# Patient Record
Sex: Female | Born: 1940
Health system: Southern US, Community
[De-identification: ages and names within clinical notes are randomized; demographics above are authoritative.]

## PROBLEM LIST (undated history)

## (undated) DIAGNOSIS — I10 Essential (primary) hypertension: Secondary | ICD-10-CM

## (undated) DIAGNOSIS — G4730 Sleep apnea, unspecified: Secondary | ICD-10-CM

## (undated) DIAGNOSIS — T7840XA Allergy, unspecified, initial encounter: Secondary | ICD-10-CM

## (undated) DIAGNOSIS — K219 Gastro-esophageal reflux disease without esophagitis: Secondary | ICD-10-CM

## (undated) DIAGNOSIS — F419 Anxiety disorder, unspecified: Secondary | ICD-10-CM

## (undated) DIAGNOSIS — E119 Type 2 diabetes mellitus without complications: Secondary | ICD-10-CM

## (undated) DIAGNOSIS — F32A Depression, unspecified: Secondary | ICD-10-CM

## (undated) DIAGNOSIS — E785 Hyperlipidemia, unspecified: Secondary | ICD-10-CM

## (undated) DIAGNOSIS — F329 Major depressive disorder, single episode, unspecified: Secondary | ICD-10-CM

## (undated) HISTORY — PX: CARPAL TUNNEL RELEASE: SHX101

## (undated) HISTORY — PX: TOTAL KNEE ARTHROPLASTY: SHX125

## (undated) HISTORY — DX: Essential (primary) hypertension: I10

## (undated) HISTORY — DX: Anxiety disorder, unspecified: F41.9

## (undated) HISTORY — PX: LUMBAR LAMINECTOMY: SHX95

## (undated) HISTORY — PX: TOTAL SHOULDER REPLACEMENT: SUR1217

## (undated) HISTORY — DX: Allergy, unspecified, initial encounter: T78.40XA

## (undated) HISTORY — DX: Type 2 diabetes mellitus without complications: E11.9

## (undated) HISTORY — DX: Depression, unspecified: F32.A

## (undated) HISTORY — DX: Gastro-esophageal reflux disease without esophagitis: K21.9

## (undated) HISTORY — PX: PARTIAL HYSTERECTOMY: SHX80

## (undated) HISTORY — DX: Sleep apnea, unspecified: G47.30

## (undated) HISTORY — DX: Hyperlipidemia, unspecified: E78.5

## (undated) HISTORY — PX: BUNIONECTOMY: SHX129

---
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
## 1991-02-19 DIAGNOSIS — Z853 Personal history of malignant neoplasm of breast: Secondary | ICD-10-CM | POA: Insufficient documentation

## 1991-02-19 HISTORY — PX: MASTECTOMY MODIFIED RADICAL: SUR848

## 2008-02-19 DIAGNOSIS — Z86010 Personal history of colonic polyps: Secondary | ICD-10-CM | POA: Insufficient documentation

## 2010-03-22 ENCOUNTER — Ambulatory Visit: Payer: Self-pay | Admitting: Family Medicine

## 2012-02-28 ENCOUNTER — Ambulatory Visit: Payer: Self-pay | Admitting: Internal Medicine

## 2012-06-22 ENCOUNTER — Ambulatory Visit: Payer: Self-pay | Admitting: Internal Medicine

## 2013-06-18 LAB — HM COLONOSCOPY

## 2013-06-23 ENCOUNTER — Ambulatory Visit: Payer: Self-pay | Admitting: Internal Medicine

## 2014-06-27 ENCOUNTER — Ambulatory Visit
Admission: RE | Admit: 2014-06-27 | Discharge: 2014-06-27 | Disposition: A | Discharge status: Home / Self Care | Payer: BLUE CROSS/BLUE SHIELD | Source: Ambulatory Visit | Attending: Internal Medicine | Admitting: Internal Medicine

## 2014-06-27 ENCOUNTER — Other Ambulatory Visit: Payer: Self-pay | Admitting: Internal Medicine

## 2014-06-27 DIAGNOSIS — Z853 Personal history of malignant neoplasm of breast: Secondary | ICD-10-CM

## 2014-06-27 LAB — HEMOGLOBIN A1C: Hgb A1c MFr Bld: 6.9 % — AB (ref 4.0–6.0)

## 2014-06-27 LAB — LIPID PANEL
Cholesterol: 219 mg/dL — AB (ref 0–200)
HDL: 36 mg/dL (ref 35–70)
LDL Cholesterol: 110 mg/dL
Triglycerides: 219 mg/dL — AB (ref 40–160)

## 2014-06-27 LAB — BASIC METABOLIC PANEL
BUN: 16 mg/dL (ref 4–21)
Creatinine: 0.8 mg/dL (ref <=1.1)

## 2014-06-27 LAB — CBC AND DIFFERENTIAL: Hemoglobin: 13.9 g/dL (ref 12.0–16.0)

## 2014-06-27 LAB — TSH: TSH: 3.1 u[IU]/mL (ref <=5.90)

## 2014-08-29 ENCOUNTER — Other Ambulatory Visit: Payer: Self-pay | Admitting: Internal Medicine

## 2014-08-29 ENCOUNTER — Encounter: Payer: Self-pay | Admitting: Internal Medicine

## 2014-08-29 DIAGNOSIS — E1169 Type 2 diabetes mellitus with other specified complication: Secondary | ICD-10-CM | POA: Insufficient documentation

## 2014-08-29 DIAGNOSIS — B009 Herpesviral infection, unspecified: Secondary | ICD-10-CM | POA: Insufficient documentation

## 2014-08-29 DIAGNOSIS — K219 Gastro-esophageal reflux disease without esophagitis: Secondary | ICD-10-CM | POA: Insufficient documentation

## 2014-08-29 DIAGNOSIS — E785 Hyperlipidemia, unspecified: Secondary | ICD-10-CM

## 2014-08-29 DIAGNOSIS — G4733 Obstructive sleep apnea (adult) (pediatric): Secondary | ICD-10-CM | POA: Insufficient documentation

## 2014-08-29 DIAGNOSIS — G44209 Tension-type headache, unspecified, not intractable: Secondary | ICD-10-CM | POA: Insufficient documentation

## 2014-08-29 DIAGNOSIS — I10 Essential (primary) hypertension: Secondary | ICD-10-CM | POA: Insufficient documentation

## 2014-08-29 DIAGNOSIS — F3341 Major depressive disorder, recurrent, in partial remission: Secondary | ICD-10-CM | POA: Insufficient documentation

## 2014-08-29 DIAGNOSIS — M5136 Other intervertebral disc degeneration, lumbar region: Secondary | ICD-10-CM | POA: Insufficient documentation

## 2014-08-29 DIAGNOSIS — E119 Type 2 diabetes mellitus without complications: Secondary | ICD-10-CM

## 2014-08-29 DIAGNOSIS — R1013 Epigastric pain: Secondary | ICD-10-CM | POA: Insufficient documentation

## 2014-08-29 DIAGNOSIS — B356 Tinea cruris: Secondary | ICD-10-CM | POA: Insufficient documentation

## 2014-09-30 ENCOUNTER — Ambulatory Visit: Payer: Self-pay | Admitting: Internal Medicine

## 2014-10-06 DIAGNOSIS — R0681 Apnea, not elsewhere classified: Secondary | ICD-10-CM | POA: Insufficient documentation

## 2014-11-01 ENCOUNTER — Ambulatory Visit (INDEPENDENT_AMBULATORY_CARE_PROVIDER_SITE_OTHER): Payer: BLUE CROSS/BLUE SHIELD | Admitting: Internal Medicine

## 2014-11-01 ENCOUNTER — Encounter: Payer: Self-pay | Admitting: Internal Medicine

## 2014-11-01 ENCOUNTER — Other Ambulatory Visit: Payer: Self-pay

## 2014-11-01 VITALS — BP 128/70 | HR 64 | Ht 63.0 in | Wt 226.6 lb

## 2014-11-01 DIAGNOSIS — I10 Essential (primary) hypertension: Secondary | ICD-10-CM | POA: Diagnosis not present

## 2014-11-01 DIAGNOSIS — E785 Hyperlipidemia, unspecified: Secondary | ICD-10-CM | POA: Diagnosis not present

## 2014-11-01 DIAGNOSIS — G4733 Obstructive sleep apnea (adult) (pediatric): Secondary | ICD-10-CM | POA: Diagnosis not present

## 2014-11-01 DIAGNOSIS — E119 Type 2 diabetes mellitus without complications: Secondary | ICD-10-CM | POA: Diagnosis not present

## 2014-11-01 NOTE — Progress Notes (Signed)
Date:  11/01/2014   Name:  Kimberly Walls   DOB:  1940-06-07   MRN:  [id number]   Chief Complaint: Diabetes and Hypertension Diabetes She presents for her follow-up diabetic visit. She has type 2 diabetes mellitus. The initial diagnosis of diabetes was made 4 months ago. Disease course: Patient does not want to check her blood sugars she is afraid of sticking her finger. She's been working on diet And Trying to Exercise. She Is Found Some Nutritionist under Him and We'll Set up an Appointment to [employer] and Get Some Education Underway. There are no hypoglycemic associated symptoms. Pertinent negatives for hypoglycemia include no tremors. Pertinent negatives for diabetes include no chest pain, no fatigue and no weakness. Current diabetic treatment includes diet. Her weight is decreasing steadily. She is following a generally healthy diet. She participates in exercise three times a week. An ACE inhibitor/angiotensin II receptor blocker is being taken.  Hypertension This is a chronic problem. The current episode started more than 1 year ago. The problem is unchanged. The problem is controlled. Pertinent negatives include no chest pain, palpitations or shortness of breath. There are no associated agents to hypertension.     Review of Systems:  Review of Systems  Constitutional: Negative for chills and fatigue.  Eyes: Negative for visual disturbance.  Respiratory: Negative for choking and shortness of breath.   Cardiovascular: Negative for chest pain and palpitations.  Gastrointestinal: Negative for abdominal pain.  Neurological: Negative for tremors and weakness.    Patient Active Problem List   Diagnosis Date Noted  . DDD (degenerative disc disease), lumbar 08/29/2014  . Dyslipidemia 08/29/2014  . Essential (primary) hypertension 08/29/2014  . Acid reflux 08/29/2014  . Herpes 08/29/2014  . Obstructive sleep apnea of adult 08/29/2014  . Depression, major, recurrent, in partial remission  08/29/2014  . Headache, tension-type 08/29/2014  . Dermatophytosis of groin 08/29/2014  . Diabetes 08/29/2014  . History of colon polyps 02/19/2008  . Personal history of malignant neoplasm of breast 02/19/1991    Prior to Admission medications   Medication Sig Start Date End Date Taking? Authorizing Provider  acyclovir (ZOVIRAX) 400 MG tablet Take 1 tablet by mouth 3 (three) times daily. 06/22/12  Yes Historical Provider, MD  amLODipine (NORVASC) 2.5 MG tablet Take 1 tablet by mouth daily. 12/20/13  Yes Historical Provider, MD  aspirin 81 MG tablet Take 1 tablet by mouth daily.   Yes Historical Provider, MD  Calcium Carb-Cholecalciferol (CALCIUM + D3) 600-200 MG-UNIT TABS Take 1 tablet by mouth daily.   Yes Historical Provider, MD  Coenzyme Q10 (COQ10) 200 MG CAPS Take 1 capsule by mouth daily.   Yes Historical Provider, MD  cyanocobalamin 1000 MCG tablet Take 1 tablet by mouth daily.   Yes Historical Provider, MD  desloratadine (CLARINEX) 5 MG tablet Take 1 tablet by mouth daily. 06/17/14  Yes Historical Provider, MD  escitalopram (LEXAPRO) 10 MG tablet Take 1 tablet by mouth daily. 06/17/14  Yes Historical Provider, MD  hydrochlorothiazide (HYDRODIURIL) 25 MG tablet Take 1 tablet by mouth daily. 01/18/14  Yes Historical Provider, MD  lisinopril (PRINIVIL,ZESTRIL) 20 MG tablet Take 1 tablet by mouth daily. 05/18/14  Yes Historical Provider, MD  nystatin cream (MYCOSTATIN)  10/11/14  Yes Historical Provider, MD  Omeprazole 20 MG TBEC Take 1 tablet by mouth daily. 06/27/14  Yes Historical Provider, MD  oxaprozin (DAYPRO) 600 MG tablet Take 1 tablet by mouth 2 (two) times daily. 06/27/14  Yes Historical Provider, MD  triamcinolone cream (  KENALOG) 0.1 %  10/11/14  Yes Historical Provider, MD  azelastine (OPTIVAR) 0.05 % ophthalmic solution Apply 1 drop to eye daily. 06/22/12   Historical Provider, MD  Biotin (BIOTIN 5000) 5 MG CAPS Take 1 capsule by mouth daily.    Historical Provider, MD  mupirocin  ointment (BACTROBAN) 2 %  10/11/14   Historical Provider, MD    Allergies  Allergen Reactions  . Prochlorperazine   . Tetanus Toxoids Rash    Past Surgical History  Procedure Laterality Date  . Total knee arthroplasty Bilateral   . Mastectomy modified radical Bilateral 1993  . Partial hysterectomy    . Carpal tunnel release Left   . Total shoulder replacement Bilateral   . Lumbar laminectomy    . Bunionectomy Bilateral     Social History  Substance Use Topics  . Smoking status: Never Smoker   . Smokeless tobacco: None  . Alcohol Use: 1.2 oz/week    2 Standard drinks or equivalent per week     Medication list has been reviewed and updated.  Physical Examination:  Physical Exam  Constitutional: She is oriented to person, place, and time. She appears well-developed and well-nourished. No distress.  HENT:  Head: Normocephalic and atraumatic.  Eyes: Conjunctivae are normal. Right eye exhibits no discharge. Left eye exhibits no discharge. No scleral icterus.  Neck: Normal range of motion. No thyromegaly present.  Cardiovascular: Normal rate, regular rhythm and normal heart sounds.   Pulmonary/Chest: Effort normal and breath sounds normal. No respiratory distress. She has no wheezes.  Musculoskeletal: Normal range of motion. She exhibits no edema.  Neurological: She is alert and oriented to person, place, and time.  Skin: Skin is warm and dry. No rash noted.  Psychiatric: She has a normal mood and affect. Her behavior is normal. Thought content normal.    BP 128/70 mmHg  Pulse 64  Ht 5\' 3"  (1.6 m)  Wt 226 lb 9.6 oz (102.785 kg)  BMI 40.15 kg/m2  Assessment and Plan: 1. Essential (primary) hypertension Controlled continue current medication  2. Type 2 diabetes mellitus without complication Appears to be increasing; will check A1c and advise on medication as well as interval follow-up Patient will investigate dietitians in [place] and call for a referral if needed Did  not prescribe a glucometer as patient does not feel she can perform fingerstick blood sugars at home. - Hemoglobin A1c  3. Dyslipidemia Stable on medication Form for work is completed  4. Obstructive sleep apnea of adult Patient continues without using her CPAP - she strongly encouraged to try using it again.   Halina Maidens, MD [facility]  11/01/2014

## 2014-11-02 LAB — HEMOGLOBIN A1C
Est. average glucose Bld gHb Est-mCnc: 160 mg/dL
Hgb A1c MFr Bld: 7.2 % — ABNORMAL HIGH (ref 4.8–5.6)

## 2014-12-05 ENCOUNTER — Ambulatory Visit (INDEPENDENT_AMBULATORY_CARE_PROVIDER_SITE_OTHER): Payer: BLUE CROSS/BLUE SHIELD | Admitting: Internal Medicine

## 2014-12-05 ENCOUNTER — Encounter: Payer: Self-pay | Admitting: Internal Medicine

## 2014-12-05 VITALS — BP 162/78 | HR 76 | Ht 63.0 in | Wt 225.0 lb

## 2014-12-05 DIAGNOSIS — E119 Type 2 diabetes mellitus without complications: Secondary | ICD-10-CM

## 2014-12-05 DIAGNOSIS — I10 Essential (primary) hypertension: Secondary | ICD-10-CM

## 2014-12-05 DIAGNOSIS — F3341 Major depressive disorder, recurrent, in partial remission: Secondary | ICD-10-CM | POA: Diagnosis not present

## 2014-12-05 DIAGNOSIS — M1990 Unspecified osteoarthritis, unspecified site: Secondary | ICD-10-CM | POA: Insufficient documentation

## 2014-12-05 DIAGNOSIS — I2699 Other pulmonary embolism without acute cor pulmonale: Secondary | ICD-10-CM | POA: Insufficient documentation

## 2014-12-05 MED ORDER — LISINOPRIL 40 MG PO TABS: 20.0000 mg | ORAL_TABLET | Freq: Every day | ORAL | Status: DC

## 2014-12-05 NOTE — Progress Notes (Signed)
Date:  12/05/2014   Name:  Kimberly Walls   DOB:  03/11/1940   MRN:  [id number]   Chief Complaint: Hypertension Seen last week in the ER with elevated BP.  Work up was negative and lisinopril was increased to 30 mg. she states she has been upset by events at work. Since increasing to 30 mg her blood pressure still [id number] systolic. She denies chest pains, shortness of breath, dizziness, or increased lower extremity edema.  Depression - patient continues on Lexapro daily. She feels like her depression is fairly well-controlled although she is a little stressed by work and issues surrounding her house. She plans to continue work until she gets her house completed. Appetite is good and she has no issues with sleep  Diabetes - patient is now seeing a dietitian in [place]. She has 6 free visits through her insurance and plans to take advantage of that. Currently controlling her blood sugars through diet and activity. Not checking blood sugars at home at this point.  Review of Systems  Constitutional: Negative for activity change, appetite change and unexpected weight change.  HENT: Negative for hearing loss.   Eyes: Negative for visual disturbance.  Respiratory: Negative for cough, chest tightness and shortness of breath.   Cardiovascular: Negative for chest pain, palpitations and leg swelling.  Gastrointestinal: Negative for abdominal pain.  Endocrine: Negative for polydipsia and polyuria.  Skin: Negative for rash and wound.  Neurological: Negative for syncope, light-headedness and headaches.  Psychiatric/Behavioral: Positive for dysphoric mood. Negative for sleep disturbance.    Patient Active Problem List   Diagnosis Date Noted  . Arthritis, degenerative 12/05/2014  . PE (pulmonary embolism) 12/05/2014  . Breathlessness on exertion 10/06/2014  . DDD (degenerative disc disease), lumbar 08/29/2014  . Dyslipidemia 08/29/2014  . Essential (primary) hypertension 08/29/2014  . Acid reflux  08/29/2014  . Herpes 08/29/2014  . Obstructive sleep apnea of adult 08/29/2014  . Depression, major, recurrent, in partial remission ([place]) 08/29/2014  . Headache, tension-type 08/29/2014  . Dermatophytosis of groin 08/29/2014  . Diabetes ([place]) 08/29/2014  . History of colon polyps 02/19/2008  . Personal history of malignant neoplasm of breast 02/19/1991    Prior to Admission medications   Medication Sig Start Date End Date Taking? Authorizing Provider  acyclovir (ZOVIRAX) 400 MG tablet Take 1 tablet by mouth 3 (three) times daily. 06/22/12  Yes Historical Provider, MD  amLODipine (NORVASC) 2.5 MG tablet Take 1 tablet by mouth daily. 12/20/13  Yes Historical Provider, MD  aspirin 81 MG tablet Take 1 tablet by mouth daily.   Yes Historical Provider, MD  Calcium Carb-Cholecalciferol (CALCIUM + D3) 600-200 MG-UNIT TABS Take 1 tablet by mouth daily.   Yes Historical Provider, MD  Coenzyme Q10 (COQ10) 200 MG CAPS Take 1 capsule by mouth daily.   Yes Historical Provider, MD  cyanocobalamin 1000 MCG tablet Take 1 tablet by mouth daily.   Yes Historical Provider, MD  desloratadine (CLARINEX) 5 MG tablet Take 1 tablet by mouth daily. 06/17/14  Yes Historical Provider, MD  escitalopram (LEXAPRO) 10 MG tablet Take 1 tablet by mouth daily. 06/17/14  Yes Historical Provider, MD  hydrochlorothiazide (HYDRODIURIL) 25 MG tablet Take 1 tablet by mouth daily. 01/18/14  Yes Historical Provider, MD  lisinopril (PRINIVIL,ZESTRIL) 20 MG tablet Take 1 tablet by mouth daily. 05/18/14  Yes Historical Provider, MD  mupirocin ointment (BACTROBAN) 2 %  10/11/14  Yes Historical Provider, MD  nystatin cream (MYCOSTATIN)  10/11/14  Yes Historical Provider, MD  Omeprazole  20 MG TBEC Take 1 tablet by mouth daily. 06/27/14  Yes Historical Provider, MD  oxaprozin (DAYPRO) 600 MG tablet Take 1 tablet by mouth 2 (two) times daily. 06/27/14  Yes Historical Provider, MD  triamcinolone cream (KENALOG) 0.1 %  10/11/14  Yes Historical Provider,  MD  azelastine (OPTIVAR) 0.05 % ophthalmic solution Apply 1 drop to eye daily. 06/22/12   Historical Provider, MD    Allergies  Allergen Reactions  . Prochlorperazine   . Tetanus Toxoids Rash    Past Surgical History  Procedure Laterality Date  . Total knee arthroplasty Bilateral   . Mastectomy modified radical Bilateral 1993  . Partial hysterectomy    . Carpal tunnel release Left   . Total shoulder replacement Bilateral   . Lumbar laminectomy    . Bunionectomy Bilateral     Social History  Substance Use Topics  . Smoking status: Never Smoker   . Smokeless tobacco: None  . Alcohol Use: 1.2 oz/week    2 Standard drinks or equivalent per week     Medication list has been reviewed and updated.   Physical Exam  Constitutional: She is oriented to person, place, and time. She appears well-developed. No distress.  HENT:  Head: Normocephalic and atraumatic.  Eyes: Conjunctivae are normal. Right eye exhibits no discharge. Left eye exhibits no discharge. No scleral icterus.  Neck: Normal range of motion.  Cardiovascular: Normal rate, regular rhythm and normal heart sounds.   Pulmonary/Chest: Effort normal and breath sounds normal. No respiratory distress. She has no wheezes.  Musculoskeletal: Normal range of motion. She exhibits no edema or tenderness.  Neurological: She is alert and oriented to person, place, and time.  Skin: Skin is warm and dry. No rash noted.  Psychiatric: She has a normal mood and affect. Her behavior is normal. Thought content normal.  Nursing note and vitals reviewed.   BP 162/78 mmHg  Pulse 76  Ht 5\' 3"  (1.6 m)  Wt 225 lb (102.059 kg)  BMI 39.87 kg/m2  Assessment and Plan: 1. Essential (primary) hypertension Not well controlled so will increase lisinopril to 40 mg daily She will monitor blood pressures at home - she is instructed in use of her home cuff - lisinopril (PRINIVIL,ZESTRIL) 40 MG tablet; Take 0.5 tablets (20 mg total) by mouth daily.   Dispense: 30 tablet; Refill: 5  2. Type 2 diabetes mellitus without complication, without long-term current use of insulin (HCC) Continue diet and activity and follow-up with dietitian  3. Depression, major, recurrent, in partial remission ([place]) Doing well; continue Lexapro  Halina Maidens, MD [facility]  12/05/2014

## 2014-12-27 ENCOUNTER — Other Ambulatory Visit: Payer: Self-pay | Admitting: Internal Medicine

## 2015-01-19 ENCOUNTER — Other Ambulatory Visit: Payer: Self-pay | Admitting: Internal Medicine

## 2015-03-06 ENCOUNTER — Ambulatory Visit (INDEPENDENT_AMBULATORY_CARE_PROVIDER_SITE_OTHER): Payer: BLUE CROSS/BLUE SHIELD | Admitting: Internal Medicine

## 2015-03-06 ENCOUNTER — Other Ambulatory Visit: Payer: Self-pay | Admitting: Internal Medicine

## 2015-03-06 ENCOUNTER — Encounter: Payer: Self-pay | Admitting: Internal Medicine

## 2015-03-06 VITALS — BP 136/80 | HR 76 | Ht 63.0 in | Wt 218.4 lb

## 2015-03-06 DIAGNOSIS — I10 Essential (primary) hypertension: Secondary | ICD-10-CM | POA: Diagnosis not present

## 2015-03-06 DIAGNOSIS — E119 Type 2 diabetes mellitus without complications: Secondary | ICD-10-CM | POA: Diagnosis not present

## 2015-03-06 DIAGNOSIS — E118 Type 2 diabetes mellitus with unspecified complications: Secondary | ICD-10-CM | POA: Insufficient documentation

## 2015-03-06 NOTE — Progress Notes (Signed)
Date:  03/06/2015   Name:  Kimberly Walls   DOB:  07-31-1940   MRN:  [id number]   Chief Complaint: Follow-up; Hypertension; and Diabetes Hypertension This is a chronic problem. The current episode started more than 1 year ago. The problem is unchanged. The problem is controlled. Pertinent negatives include no chest pain, headaches, palpitations or shortness of breath. Risk factors for coronary artery disease include diabetes mellitus and dyslipidemia. Past treatments include calcium channel blockers, ACE inhibitors and diuretics. The current treatment provides significant improvement.  Diabetes She presents for her follow-up diabetic visit. She has type 2 diabetes mellitus. Her disease course has been stable. Pertinent negatives for hypoglycemia include no headaches or tremors. Pertinent negatives for diabetes include no chest pain, no fatigue, no polydipsia and no polyuria. Current diabetic treatment includes diet. She is compliant with treatment most of the time.     Review of Systems  Constitutional: Negative for fever, appetite change, fatigue and unexpected weight change.  HENT: Negative for tinnitus and trouble swallowing.   Eyes: Negative for visual disturbance.  Respiratory: Negative for cough, chest tightness and shortness of breath.   Cardiovascular: Negative for chest pain, palpitations and leg swelling.  Gastrointestinal: Negative for abdominal pain.  Endocrine: Negative for polydipsia and polyuria.  Genitourinary: Negative for dysuria and hematuria.  Musculoskeletal: Negative for arthralgias.  Neurological: Negative for tremors, numbness and headaches.  Psychiatric/Behavioral: Negative for dysphoric mood.    Patient Active Problem List   Diagnosis Date Noted  . Arthritis, degenerative 12/05/2014  . PE (pulmonary embolism) 12/05/2014  . Breathlessness on exertion 10/06/2014  . DDD (degenerative disc disease), lumbar 08/29/2014  . Dyslipidemia 08/29/2014  . Essential  (primary) hypertension 08/29/2014  . Acid reflux 08/29/2014  . Herpes 08/29/2014  . Obstructive sleep apnea of adult 08/29/2014  . Depression, major, recurrent, in partial remission ([place]) 08/29/2014  . Headache, tension-type 08/29/2014  . Dermatophytosis of groin 08/29/2014  . Diabetes ([place]) 08/29/2014  . History of colon polyps 02/19/2008  . Personal history of malignant neoplasm of breast 02/19/1991    Prior to Admission medications   Medication Sig Start Date End Date Taking? Authorizing Provider  acyclovir (ZOVIRAX) 400 MG tablet Take 1 tablet by mouth 3 (three) times daily. 06/22/12  Yes Historical Provider, MD  amLODipine (NORVASC) 2.5 MG tablet TAKE 1 TABLET BY MOUTH EVERY DAY 12/27/14  Yes Glean Hess, MD  aspirin 81 MG tablet Take 1 tablet by mouth daily.   Yes Historical Provider, MD  Calcium Carb-Cholecalciferol (CALCIUM + D3) 600-200 MG-UNIT TABS Take 1 tablet by mouth daily.   Yes Historical Provider, MD  Coenzyme Q10 (COQ10) 200 MG CAPS Take 1 capsule by mouth daily.   Yes Historical Provider, MD  cyanocobalamin 1000 MCG tablet Take 1 tablet by mouth daily.   Yes Historical Provider, MD  desloratadine (CLARINEX) 5 MG tablet Take 1 tablet by mouth daily. 06/17/14  Yes Historical Provider, MD  escitalopram (LEXAPRO) 10 MG tablet Take 1 tablet by mouth daily. 06/17/14  Yes Historical Provider, MD  hydrochlorothiazide (HYDRODIURIL) 25 MG tablet TAKE 1 TABLET BY MOUTH ONCE DAILY 01/19/15  Yes Glean Hess, MD  lisinopril (PRINIVIL,ZESTRIL) 40 MG tablet Take 1 tablet by mouth daily. 01/24/15  Yes Historical Provider, MD  mupirocin ointment (BACTROBAN) 2 %  10/11/14  Yes Historical Provider, MD  nystatin cream (MYCOSTATIN)  10/11/14  Yes Historical Provider, MD  Omeprazole 20 MG TBEC Take 1 tablet by mouth daily. 06/27/14  Yes Historical  Provider, MD  oxaprozin (DAYPRO) 600 MG tablet Take 1 tablet by mouth 2 (two) times daily. 06/27/14  Yes Historical Provider, MD  triamcinolone cream  (KENALOG) 0.1 %  10/11/14  Yes Historical Provider, MD    Allergies  Allergen Reactions  . Prochlorperazine   . Pneumococcal Vaccine Rash  . Tetanus Toxoids Rash    Past Surgical History  Procedure Laterality Date  . Total knee arthroplasty Bilateral   . Mastectomy modified radical Bilateral 1993  . Partial hysterectomy    . Carpal tunnel release Left   . Total shoulder replacement Bilateral   . Lumbar laminectomy    . Bunionectomy Bilateral     Social History  Substance Use Topics  . Smoking status: Never Smoker   . Smokeless tobacco: None  . Alcohol Use: 1.2 oz/week    2 Standard drinks or equivalent per week     Comment: occasional   Lab Results  Component Value Date   HGBA1C 7.2* 11/01/2014    Medication list has been reviewed and updated.   Physical Exam  Constitutional: She is oriented to person, place, and time. She appears well-developed. No distress.  HENT:  Head: Normocephalic and atraumatic.  Neck: Normal range of motion. Neck supple. No thyromegaly present.  Cardiovascular: Normal rate, regular rhythm and normal heart sounds.   Pulmonary/Chest: Effort normal and breath sounds normal. No respiratory distress.  Musculoskeletal: Normal range of motion. She exhibits no edema or tenderness.  Neurological: She is alert and oriented to person, place, and time.  Skin: Skin is warm and dry. No rash noted.  Psychiatric: She has a normal mood and affect. Her behavior is normal. Thought content normal.  Nursing note and vitals reviewed.   BP 136/80 mmHg  Pulse 76  Ht 5\' 3"  (1.6 m)  Wt 218 lb 6.4 oz (99.066 kg)  BMI 38.70 kg/m2  Assessment and Plan: 1. Essential (primary) hypertension Well-controlled - Basic metabolic panel  2. Type 2 diabetes mellitus without complication, without long-term current use of insulin ([place]) Doing well with diet and weight loss on no medications at this time Recommend ophthalmology exam annually - Hemoglobin A1c   Halina Maidens, MD [facility]  03/06/2015

## 2015-03-07 ENCOUNTER — Telehealth: Payer: Self-pay

## 2015-03-07 LAB — BASIC METABOLIC PANEL
BUN/Creatinine Ratio: 22 (ref 11–26)
BUN: 19 mg/dL (ref 8–27)
CO2: 24 mmol/L (ref 18–29)
Calcium: 9.6 mg/dL (ref 8.7–10.3)
Chloride: 97 mmol/L (ref 96–106)
Creatinine, Ser: 0.86 mg/dL (ref 0.57–1.00)
GFR calc Af Amer: 78 mL/min/{1.73_m2} (ref >59)
GFR calc non Af Amer: 67 mL/min/{1.73_m2} (ref >59)
Glucose: 109 mg/dL — ABNORMAL HIGH (ref 65–99)
Potassium: 4.2 mmol/L (ref 3.5–5.2)
Sodium: 140 mmol/L (ref 134–144)

## 2015-03-07 LAB — HEMOGLOBIN A1C
Est. average glucose Bld gHb Est-mCnc: 140 mg/dL
Hgb A1c MFr Bld: 6.5 % — ABNORMAL HIGH (ref 4.8–5.6)

## 2015-03-07 NOTE — Telephone Encounter (Signed)
-----   Message from Glean Hess, MD sent at 03/07/2015  9:30 AM EST ----- DM is much better!  Continue diet and weight loss.  Kidney function is normal.

## 2015-03-07 NOTE — Telephone Encounter (Signed)
Spoke with patient. Patient advised of all results and verbalized understanding. Will call back with any future questions or concerns. MAH  

## 2015-06-12 ENCOUNTER — Other Ambulatory Visit: Payer: Self-pay | Admitting: Internal Medicine

## 2015-06-29 ENCOUNTER — Encounter: Payer: Self-pay | Admitting: Internal Medicine

## 2015-06-29 ENCOUNTER — Ambulatory Visit (INDEPENDENT_AMBULATORY_CARE_PROVIDER_SITE_OTHER): Payer: BLUE CROSS/BLUE SHIELD | Admitting: Internal Medicine

## 2015-06-29 VITALS — BP 128/88 | HR 84 | Resp 16 | Ht 63.0 in | Wt 205.6 lb

## 2015-06-29 DIAGNOSIS — G4733 Obstructive sleep apnea (adult) (pediatric): Secondary | ICD-10-CM

## 2015-06-29 DIAGNOSIS — F3341 Major depressive disorder, recurrent, in partial remission: Secondary | ICD-10-CM | POA: Diagnosis not present

## 2015-06-29 DIAGNOSIS — K219 Gastro-esophageal reflux disease without esophagitis: Secondary | ICD-10-CM | POA: Diagnosis not present

## 2015-06-29 DIAGNOSIS — Z86711 Personal history of pulmonary embolism: Secondary | ICD-10-CM | POA: Insufficient documentation

## 2015-06-29 DIAGNOSIS — E119 Type 2 diabetes mellitus without complications: Secondary | ICD-10-CM

## 2015-06-29 DIAGNOSIS — I10 Essential (primary) hypertension: Secondary | ICD-10-CM

## 2015-06-29 DIAGNOSIS — I2699 Other pulmonary embolism without acute cor pulmonale: Secondary | ICD-10-CM

## 2015-06-29 DIAGNOSIS — Z6838 Body mass index (BMI) 38.0-38.9, adult: Secondary | ICD-10-CM | POA: Insufficient documentation

## 2015-06-29 DIAGNOSIS — Z0000 Encounter for general adult medical examination without abnormal findings: Secondary | ICD-10-CM

## 2015-06-29 LAB — POCT URINALYSIS DIPSTICK
Bilirubin, UA: NEGATIVE
Blood, UA: NEGATIVE
Glucose, UA: NEGATIVE
Ketones, UA: NEGATIVE
Nitrite, UA: NEGATIVE
Protein, UA: NEGATIVE
Spec Grav, UA: 1.01
pH, UA: 7

## 2015-06-29 NOTE — Progress Notes (Signed)
Date:  06/29/2015   Name:  Kimberly Walls   DOB:  1940-11-15   MRN:  [id number]   Chief Complaint: Annual Exam Kimberly Walls is a 75 y.o. female who presents today for her Complete Annual Exam. She feels well. She reports exercising several days a week. She reports she is sleeping well.   Hypertension This is a chronic problem. The current episode started more than 1 year ago. The problem is unchanged. The problem is controlled. Pertinent negatives include no chest pain, headaches, palpitations or shortness of breath.  Diabetes She presents for her follow-up diabetic visit. She has type 2 diabetes mellitus. Pertinent negatives for hypoglycemia include no dizziness, headaches, nervousness/anxiousness or tremors. Associated symptoms include fatigue. Pertinent negatives for diabetes include no chest pain, no polydipsia and no polyuria. Current diabetic treatment includes diet. She is compliant with treatment most of the time.  Gastroesophageal Reflux She reports no abdominal pain, no chest pain, no coughing or no wheezing. This is a recurrent problem. The problem occurs occasionally. The problem has been waxing and waning. Associated symptoms include fatigue. She has tried a PPI for the symptoms.  Depression - doing well on current medication.  She continues to work two jobs.  She sleeps well.    Lab Results  Component Value Date   HGBA1C 6.5* 03/06/2015   Lab Results  Component Value Date   CREATININE 0.86 03/06/2015   Lab Results  Component Value Date   CHOL 219* 06/27/2014   HDL 36 06/27/2014   LDLCALC 110 06/27/2014   TRIG 219* 06/27/2014   Lab Results  Component Value Date   HGB 13.9 06/27/2014     Review of Systems  Constitutional: Positive for fatigue. Negative for fever and chills.  HENT: Negative for congestion, hearing loss, tinnitus, trouble swallowing and voice change.   Eyes: Negative for visual disturbance.  Respiratory: Negative for cough, chest tightness,  shortness of breath and wheezing.   Cardiovascular: Negative for chest pain, palpitations and leg swelling.  Gastrointestinal: Negative for vomiting, abdominal pain, diarrhea and constipation.  Endocrine: Negative for polydipsia and polyuria.  Genitourinary: Negative for dysuria, frequency, vaginal bleeding, vaginal discharge and genital sores.  Musculoskeletal: Negative for joint swelling, arthralgias and gait problem.  Skin: Positive for rash (groin rash - persistent despite diflucan). Negative for color change.  Neurological: Negative for dizziness, tremors, light-headedness and headaches.  Hematological: Negative for adenopathy. Does not bruise/bleed easily.  Psychiatric/Behavioral: Negative for sleep disturbance and dysphoric mood. The patient is not nervous/anxious.     Patient Active Problem List   Diagnosis Date Noted  . Adiposity 06/29/2015  . Pulmonary embolism ([place]) 06/29/2015  . Type 2 diabetes mellitus without complication, without long-term current use of insulin ([place]) 03/06/2015  . Arthritis, degenerative 12/05/2014  . Breathlessness on exertion 10/06/2014  . DDD (degenerative disc disease), lumbar 08/29/2014  . Dyslipidemia 08/29/2014  . Essential (primary) hypertension 08/29/2014  . Acid reflux 08/29/2014  . Herpes 08/29/2014  . Obstructive sleep apnea of adult 08/29/2014  . Depression, major, recurrent, in partial remission ([place]) 08/29/2014  . Headache, tension-type 08/29/2014  . Dermatophytosis of groin 08/29/2014  . History of colon polyps 02/19/2008  . Personal history of malignant neoplasm of breast 02/19/1991    Prior to Admission medications   Medication Sig Start Date End Date Taking? Authorizing Provider  amLODipine (NORVASC) 2.5 MG tablet TAKE 1 TABLET BY MOUTH EVERY DAY 12/27/14  Yes Glean Hess, MD  aspirin 81 MG tablet Take  1 tablet by mouth daily.   Yes Historical Provider, MD  Calcium Carb-Cholecalciferol (CALCIUM + D3) 600-200 MG-UNIT TABS Take  1 tablet by mouth daily.   Yes Historical Provider, MD  Coenzyme Q10 (COQ10) 200 MG CAPS Take 1 capsule by mouth daily.   Yes Historical Provider, MD  cyanocobalamin 1000 MCG tablet Take 1 tablet by mouth daily.   Yes Historical Provider, MD  desloratadine (CLARINEX) 5 MG tablet Take 1 tablet by mouth daily. 06/17/14  Yes Historical Provider, MD  escitalopram (LEXAPRO) 10 MG tablet Take 1 tablet by mouth daily. 06/17/14  Yes Historical Provider, MD  hydrochlorothiazide (HYDRODIURIL) 25 MG tablet TAKE 1 TABLET BY MOUTH ONCE DAILY 01/19/15  Yes Glean Hess, MD  lisinopril (PRINIVIL,ZESTRIL) 20 MG tablet TAKE 1 TABLET BY MOUTH EVERY DAY 06/12/15  Yes Glean Hess, MD  nystatin cream (MYCOSTATIN)  10/11/14  Yes Historical Provider, MD  omeprazole (PRILOSEC) 20 MG capsule  06/12/15  Yes Historical Provider, MD  oxaprozin (DAYPRO) 600 MG tablet Take 1 tablet by mouth 2 (two) times daily. 06/27/14  Yes Historical Provider, MD  triamcinolone cream (KENALOG) 0.1 % Apply 1 application topically 2 (two) times daily.   Yes Historical Provider, MD  lisinopril (PRINIVIL,ZESTRIL) 40 MG tablet Take 1 tablet by mouth daily. Reported on 06/29/2015 01/24/15   Historical Provider, MD    Allergies  Allergen Reactions  . Prochlorperazine   . Pneumococcal Vaccine Rash  . Tetanus Toxoids Rash    Past Surgical History  Procedure Laterality Date  . Total knee arthroplasty Bilateral   . Mastectomy modified radical Bilateral 1993  . Partial hysterectomy    . Carpal tunnel release Left   . Total shoulder replacement Bilateral   . Lumbar laminectomy    . Bunionectomy Bilateral     Social History  Substance Use Topics  . Smoking status: Never Smoker   . Smokeless tobacco: None  . Alcohol Use: 1.2 oz/week    2 Standard drinks or equivalent per week     Comment: occasional    Medication list has been reviewed and updated.  Physical Exam  Constitutional: She is oriented to person, place, and time. She  appears well-developed and well-nourished. No distress.  HENT:  Head: Normocephalic and atraumatic.  Right Ear: Tympanic membrane and ear canal normal.  Left Ear: Tympanic membrane and ear canal normal.  Nose: Right sinus exhibits no maxillary sinus tenderness. Left sinus exhibits no maxillary sinus tenderness.  Mouth/Throat: Uvula is midline and oropharynx is clear and moist.  Eyes: Conjunctivae and EOM are normal. Right eye exhibits no discharge. Left eye exhibits no discharge. No scleral icterus.  Neck: Normal range of motion. Carotid bruit is not present. No erythema present. No thyromegaly present.  Cardiovascular: Normal rate, regular rhythm, normal heart sounds and normal pulses.   Pulmonary/Chest: Effort normal. No respiratory distress. She has no wheezes.  Bilateral mastectomies with reconstruction - no skin change or mass.  Abdominal: Soft. Bowel sounds are normal. There is no hepatosplenomegaly. There is no tenderness. There is no CVA tenderness.  Genitourinary: There is no rash or tenderness on the right labia. There is no rash or tenderness on the left labia. Right adnexum displays no mass, no tenderness and no fullness. Left adnexum displays no mass, no tenderness and no fullness.  Musculoskeletal: Normal range of motion.  Lymphadenopathy:    She has no cervical adenopathy.    She has no axillary adenopathy.  Neurological: She is alert and oriented to person,  place, and time. She has normal reflexes. No cranial nerve deficit or sensory deficit.  Skin: Skin is warm, dry and intact. Rash noted. There is erythema.  Psychiatric: She has a normal mood and affect. Her speech is normal and behavior is normal. Thought content normal.  Nursing note and vitals reviewed.   BP 128/88 mmHg  Pulse 84  Resp 16  Ht 5\' 3"  (1.6 m)  Wt 205 lb 9.6 oz (93.26 kg)  BMI 36.43 kg/m2  SpO2 97%  Assessment and Plan: 1. Preventative health care - POCT urinalysis dipstick  2. Type 2 diabetes  mellitus without complication, without long-term current use of insulin (HCC) Continue diet - will advise on medication - Hemoglobin A1c - Lipid panel - TSH  3. Gastroesophageal reflux disease, esophagitis presence not specified Controlled on PPI - CBC with Differential/Platelet  4. Essential (primary) hypertension Controlled on lisinopril 20 mg, HCTZ and Norvasc 2.5 mg - Comprehensive metabolic panel  5. Obstructive sleep apnea of adult  6. Other pulmonary embolism without acute cor pulmonale, unspecified chronicity ([place]) Completed course of anti-coagulation continue daily aspirin  7. Depression, major, recurrent, in partial remission ([place]) Doing well on current therapy.   Halina Maidens, MD [facility]  06/29/2015

## 2015-06-29 NOTE — Patient Instructions (Signed)
Health Maintenance  Topic Date Due  . TETANUS/TDAP  08/26/1960  . DEXA SCAN  08/27/2006  . PNA vac Low Risk Adult (2 of 2 - PPSV23) 06/27/2015  . ZOSTAVAX  02/19/2023 (Originally 08/26/2001)  . HEMOGLOBIN A1C  09/03/2015  . INFLUENZA VACCINE  09/19/2015  . OPHTHALMOLOGY EXAM  01/03/2016  . FOOT EXAM  06/28/2016  . COLONOSCOPY  06/19/2023

## 2015-06-30 LAB — COMPREHENSIVE METABOLIC PANEL
ALT: 24 IU/L (ref 0–32)
AST: 30 IU/L (ref 0–40)
Albumin/Globulin Ratio: 1.7 (ref 1.2–2.2)
Albumin: 4.2 g/dL (ref 3.5–4.8)
Alkaline Phosphatase: 108 IU/L (ref 39–117)
BUN/Creatinine Ratio: 27 (ref 12–28)
BUN: 23 mg/dL (ref 8–27)
Bilirubin Total: 0.3 mg/dL (ref 0.0–1.2)
CO2: 23 mmol/L (ref 18–29)
Calcium: 9.3 mg/dL (ref 8.7–10.3)
Chloride: 94 mmol/L — ABNORMAL LOW (ref 96–106)
Creatinine, Ser: 0.84 mg/dL (ref 0.57–1.00)
GFR calc Af Amer: 80 mL/min/{1.73_m2} (ref >59)
GFR calc non Af Amer: 69 mL/min/{1.73_m2} (ref >59)
Globulin, Total: 2.5 g/dL (ref 1.5–4.5)
Glucose: 113 mg/dL — ABNORMAL HIGH (ref 65–99)
Potassium: 3.9 mmol/L (ref 3.5–5.2)
Sodium: 139 mmol/L (ref 134–144)
Total Protein: 6.7 g/dL (ref 6.0–8.5)

## 2015-06-30 LAB — CBC WITH DIFFERENTIAL/PLATELET
Basophils Absolute: 0.1 10*3/uL (ref 0.0–0.2)
Basos: 1 %
EOS (ABSOLUTE): 0.4 10*3/uL (ref 0.0–0.4)
Eos: 6 %
Hematocrit: 42.5 % (ref 34.0–46.6)
Hemoglobin: 14.6 g/dL (ref 11.1–15.9)
Immature Grans (Abs): 0 10*3/uL (ref 0.0–0.1)
Immature Granulocytes: 0 %
Lymphocytes Absolute: 2.9 10*3/uL (ref 0.7–3.1)
Lymphs: 41 %
MCH: 29 pg (ref 26.6–33.0)
MCHC: 34.4 g/dL (ref 31.5–35.7)
MCV: 85 fL (ref 79–97)
Monocytes Absolute: 0.5 10*3/uL (ref 0.1–0.9)
Monocytes: 7 %
Neutrophils Absolute: 3.2 10*3/uL (ref 1.4–7.0)
Neutrophils: 45 %
Platelets: 242 10*3/uL (ref 150–379)
RBC: 5.03 x10E6/uL (ref 3.77–5.28)
RDW: 14.9 % (ref 12.3–15.4)
WBC: 7.1 10*3/uL (ref 3.4–10.8)

## 2015-06-30 LAB — HEMOGLOBIN A1C
Est. average glucose Bld gHb Est-mCnc: 140 mg/dL
Hgb A1c MFr Bld: 6.5 % — ABNORMAL HIGH (ref 4.8–5.6)

## 2015-06-30 LAB — TSH: TSH: 1.4 u[IU]/mL (ref 0.450–4.500)

## 2015-06-30 LAB — LIPID PANEL
Chol/HDL Ratio: 5.1 ratio units — ABNORMAL HIGH (ref 0.0–4.4)
Cholesterol, Total: 194 mg/dL (ref 100–199)
HDL: 38 mg/dL — ABNORMAL LOW (ref >39)
LDL Calculated: 122 mg/dL — ABNORMAL HIGH (ref 0–99)
Triglycerides: 168 mg/dL — ABNORMAL HIGH (ref 0–149)
VLDL Cholesterol Cal: 34 mg/dL (ref 5–40)

## 2015-07-12 ENCOUNTER — Other Ambulatory Visit: Payer: Self-pay | Admitting: Internal Medicine

## 2015-07-25 ENCOUNTER — Other Ambulatory Visit: Payer: Self-pay | Admitting: Internal Medicine

## 2015-09-01 ENCOUNTER — Telehealth: Payer: Self-pay

## 2015-09-01 ENCOUNTER — Other Ambulatory Visit: Payer: Self-pay

## 2015-09-01 MED ORDER — NYSTATIN 100000 UNIT/GM EX CREA: TOPICAL_CREAM | Freq: Two times a day (BID) | CUTANEOUS | Status: DC

## 2015-09-01 NOTE — Telephone Encounter (Signed)
Patient would like a bigger tube of Nystatin Cream - patient stated the tube size (30 gram) she received for this month is almost gone applying as directed. Patient would like this medication called into the [facility] in [place]. If there are any questions please give patient a call.

## 2015-11-15 LAB — LIPID PANEL
Cholesterol: 194 mg/dL (ref 0–200)
HDL: 45 mg/dL (ref 35–70)
LDL Cholesterol: 123 mg/dL
Triglycerides: 144 mg/dL (ref 40–160)

## 2016-01-01 ENCOUNTER — Ambulatory Visit (INDEPENDENT_AMBULATORY_CARE_PROVIDER_SITE_OTHER): Payer: BLUE CROSS/BLUE SHIELD | Admitting: Internal Medicine

## 2016-01-01 ENCOUNTER — Encounter: Payer: Self-pay | Admitting: Internal Medicine

## 2016-01-01 VITALS — BP 128/86 | HR 73 | Resp 16 | Ht 63.0 in | Wt 225.6 lb

## 2016-01-01 DIAGNOSIS — E119 Type 2 diabetes mellitus without complications: Secondary | ICD-10-CM | POA: Diagnosis not present

## 2016-01-01 DIAGNOSIS — I10 Essential (primary) hypertension: Secondary | ICD-10-CM

## 2016-01-01 DIAGNOSIS — Z853 Personal history of malignant neoplasm of breast: Secondary | ICD-10-CM

## 2016-01-01 DIAGNOSIS — G4733 Obstructive sleep apnea (adult) (pediatric): Secondary | ICD-10-CM | POA: Diagnosis not present

## 2016-01-01 DIAGNOSIS — E785 Hyperlipidemia, unspecified: Secondary | ICD-10-CM

## 2016-01-01 DIAGNOSIS — Z23 Encounter for immunization: Secondary | ICD-10-CM

## 2016-01-01 MED ORDER — ATORVASTATIN CALCIUM 10 MG PO TABS: 10.0000 mg | ORAL_TABLET | Freq: Every day | ORAL | 5 refills | Status: DC

## 2016-01-01 NOTE — Progress Notes (Signed)
Date:  01/01/2016   Name:  Kimberly Walls   DOB:  [birth year]   MRN:  [id number]   Chief Complaint: Hypertension Hypertension  This is a chronic problem. The current episode started more than 1 year ago. The problem is unchanged. The problem is controlled. Pertinent negatives include no chest pain, headaches, palpitations or shortness of breath. Risk factors: sleep apnea - not using device. Past treatments include ACE inhibitors, calcium channel blockers and diuretics. Identifiable causes of hypertension include sleep apnea.  Diabetes  She presents for her follow-up diabetic visit. She has type 2 diabetes mellitus. Her disease course has been stable. Pertinent negatives for hypoglycemia include no headaches or tremors. Pertinent negatives for diabetes include no chest pain, no fatigue, no polydipsia and no polyuria. She monitors blood glucose at home 1-2 x per day. Her breakfast blood glucose is taken between 7-8 am. Her breakfast blood glucose range is generally 110-130 mg/dl.  Hyperlipidemia  This is a chronic problem. Recent lipid tests were reviewed and are low (HDL). Pertinent negatives include no chest pain or shortness of breath.  Breast complaint - has some discomfort under her right breast implant.  No mass or swelling.  No injury known.  Lab Results  Component Value Date   HGBA1C 6.5 (H) 06/29/2015     Review of Systems  Constitutional: Negative for appetite change, fatigue, fever and unexpected weight change.  HENT: Negative for tinnitus and trouble swallowing.   Eyes: Negative for visual disturbance.  Respiratory: Negative for cough, chest tightness and shortness of breath.   Cardiovascular: Negative for chest pain, palpitations and leg swelling.  Gastrointestinal: Negative for abdominal pain.  Endocrine: Negative for polydipsia and polyuria.  Genitourinary: Negative for dysuria and hematuria.  Musculoskeletal: Negative for arthralgias.       Chest wall discomfort    Neurological: Negative for tremors, numbness and headaches.  Psychiatric/Behavioral: Negative for dysphoric mood.    Patient Active Problem List   Diagnosis Date Noted  . Adiposity 06/29/2015  . Pulmonary embolism ([place]) 06/29/2015  . Type 2 diabetes mellitus without complication, without long-term current use of insulin ([place]) 03/06/2015  . Arthritis, degenerative 12/05/2014  . Breathlessness on exertion 10/06/2014  . DDD (degenerative disc disease), lumbar 08/29/2014  . Dyslipidemia 08/29/2014  . Essential (primary) hypertension 08/29/2014  . Acid reflux 08/29/2014  . Herpes 08/29/2014  . Obstructive sleep apnea of adult 08/29/2014  . Depression, major, recurrent, in partial remission ([place]) 08/29/2014  . Headache, tension-type 08/29/2014  . Dermatophytosis of groin 08/29/2014  . History of colon polyps 02/19/2008  . Personal history of malignant neoplasm of breast 02/19/1991    Prior to Admission medications   Medication Sig Start Date End Date Taking? Authorizing Provider  amLODipine (NORVASC) 2.5 MG tablet TAKE 1 TABLET BY MOUTH EVERY DAY 12/27/14  Yes Glean Hess, MD  aspirin 81 MG tablet Take 1 tablet by mouth daily.   Yes Historical Provider, MD  BIOTIN 5000 PO Take by mouth.   Yes Historical Provider, MD  Calcium Carb-Cholecalciferol (CALCIUM + D3) 600-200 MG-UNIT TABS Take 1 tablet by mouth daily.   Yes Historical Provider, MD  Coenzyme Q10 (COQ10) 200 MG CAPS Take 1 capsule by mouth daily.   Yes Historical Provider, MD  cyanocobalamin 1000 MCG tablet Take 1 tablet by mouth daily.   Yes Historical Provider, MD  desloratadine (CLARINEX) 5 MG tablet TAKE 1 TABLET BY MOUTH EVERY DAY 07/12/15  Yes Glean Hess, MD  escitalopram (LEXAPRO) 10  MG tablet TAKE 1 TABLET BY MOUTH EVERY DAY 07/12/15  Yes Glean Hess, MD  hydrochlorothiazide (HYDRODIURIL) 25 MG tablet TAKE 1 TABLET BY MOUTH ONCE DAILY 01/19/15  Yes Glean Hess, MD  lisinopril (PRINIVIL,ZESTRIL) 20 MG  tablet TAKE 1 TABLET BY MOUTH EVERY DAY 06/12/15  Yes Glean Hess, MD  nystatin cream (MYCOSTATIN) Apply topically 2 (two) times daily. 09/01/15  Yes Glean Hess, MD  Omega-3 Fatty Acids (FISH OIL) 1200 MG CPDR Take by mouth.   Yes Historical Provider, MD  omeprazole (PRILOSEC) 20 MG capsule TAKE 1 CAPSULE BY MOUTH EVERY DAY 07/12/15  Yes Glean Hess, MD  oxaprozin (DAYPRO) 600 MG tablet TAKE 1 TABLET BY MOUTH TWICE A DAY 07/12/15  Yes Glean Hess, MD  triamcinolone cream (KENALOG) 0.1 % Apply 1 application topically 2 (two) times daily.   Yes Historical Provider, MD    Allergies  Allergen Reactions  . Prochlorperazine   . Pneumococcal Vaccine Rash  . Tetanus Toxoids Rash    Past Surgical History:  Procedure Laterality Date  . BUNIONECTOMY Bilateral   . CARPAL TUNNEL RELEASE Left   . LUMBAR LAMINECTOMY    . MASTECTOMY MODIFIED RADICAL Bilateral 1993  . PARTIAL HYSTERECTOMY    . TOTAL KNEE ARTHROPLASTY Bilateral   . TOTAL SHOULDER REPLACEMENT Bilateral     Social History  Substance Use Topics  . Smoking status: Never Smoker  . Smokeless tobacco: Never Used  . Alcohol use 1.2 oz/week    2 Standard drinks or equivalent per week     Comment: occasional     Medication list has been reviewed and updated.   Physical Exam  Constitutional: She is oriented to person, place, and time. She appears well-developed. No distress.  HENT:  Head: Normocephalic and atraumatic.  Cardiovascular: Normal rate, regular rhythm and normal heart sounds.   Pulmonary/Chest: Effort normal and breath sounds normal. No respiratory distress. She has no wheezes. She has no rhonchi. She exhibits tenderness (under right lateral breast implant).  Musculoskeletal: Normal range of motion.  Neurological: She is alert and oriented to person, place, and time.  Skin: Skin is warm and dry. No rash noted.  Psychiatric: She has a normal mood and affect. Her behavior is normal. Thought content  normal.  Nursing note and vitals reviewed.   BP 128/86   Pulse 73   Resp 16   Ht 5\' 3"  (1.6 m)   Wt 225 lb 9.6 oz (102.3 kg)   SpO2 95%   BMI 39.96 kg/m   Assessment and Plan: 1. Type 2 diabetes mellitus without complication, without long-term current use of insulin (HCC) Continue diet, exercise - Hemoglobin A1c  2. Essential (primary) hypertension controlled  3. Obstructive sleep apnea of adult Remains untreated  4. Dyslipidemia Will start statin therapy and recheck next visit  5. Encounter for immunization - Flu Vaccine QUAD 36+ mos IM  6. Personal history of malignant neoplasm of breast Consult breast surgeon at [facility], MD [facility]  01/01/2016

## 2016-01-02 LAB — HEMOGLOBIN A1C
Est. average glucose Bld gHb Est-mCnc: 140 mg/dL
Hgb A1c MFr Bld: 6.5 % — ABNORMAL HIGH (ref 4.8–5.6)

## 2016-01-17 ENCOUNTER — Other Ambulatory Visit: Payer: Self-pay | Admitting: Internal Medicine

## 2016-02-21 ENCOUNTER — Other Ambulatory Visit: Payer: Self-pay | Admitting: Internal Medicine

## 2016-04-30 ENCOUNTER — Ambulatory Visit: Payer: BLUE CROSS/BLUE SHIELD | Admitting: Internal Medicine

## 2016-05-01 ENCOUNTER — Encounter: Payer: Self-pay | Admitting: Internal Medicine

## 2016-05-01 ENCOUNTER — Ambulatory Visit (INDEPENDENT_AMBULATORY_CARE_PROVIDER_SITE_OTHER): Payer: BLUE CROSS/BLUE SHIELD | Admitting: Internal Medicine

## 2016-05-01 VITALS — BP 132/68 | HR 80 | Ht 63.0 in | Wt 228.0 lb

## 2016-05-01 DIAGNOSIS — E119 Type 2 diabetes mellitus without complications: Secondary | ICD-10-CM | POA: Diagnosis not present

## 2016-05-01 DIAGNOSIS — I10 Essential (primary) hypertension: Secondary | ICD-10-CM

## 2016-05-01 DIAGNOSIS — K5733 Diverticulitis of large intestine without perforation or abscess with bleeding: Secondary | ICD-10-CM

## 2016-05-01 MED ORDER — AMOXICILLIN-POT CLAVULANATE 875-125 MG PO TABS: 1.0000 | ORAL_TABLET | Freq: Two times a day (BID) | ORAL | 0 refills | Status: AC

## 2016-05-01 NOTE — Progress Notes (Signed)
Date:  05/01/2016   Name:  Kimberly Walls   DOB:  1940-06-02   MRN:  [id number]   Chief Complaint: Diabetes and Hypertension Diabetes  She presents for her follow-up diabetic visit. She has type 2 diabetes mellitus. Her disease course has been stable. There are no hypoglycemic associated symptoms. Pertinent negatives for hypoglycemia include no headaches or tremors. Pertinent negatives for diabetes include no chest pain, no fatigue, no polydipsia and no polyuria. Current diabetic treatment includes diet. She is compliant with treatment most of the time.  Hypertension  This is a chronic problem. The current episode started more than 1 year ago. The problem is controlled. Pertinent negatives include no chest pain, headaches, palpitations or shortness of breath. Risk factors for coronary artery disease include dyslipidemia and diabetes mellitus. Past treatments include ACE inhibitors. The current treatment provides significant improvement.  Abdominal Pain  This is a new problem. The current episode started 1 to 4 weeks ago. The problem has been gradually improving. The pain is located in the LLQ. The pain is mild. The quality of the pain is colicky. The abdominal pain does not radiate. Associated symptoms include diarrhea and hematochezia. Pertinent negatives include no arthralgias, dysuria, fever, headaches or hematuria. The pain is relieved by nothing. diverticulosis    Lab Results  Component Value Date   HGBA1C 6.5 (H) 01/01/2016    Review of Systems  Constitutional: Negative for appetite change, fatigue, fever and unexpected weight change.  HENT: Negative for tinnitus and trouble swallowing.   Eyes: Negative for visual disturbance.  Respiratory: Negative for cough, chest tightness and shortness of breath.   Cardiovascular: Negative for chest pain, palpitations and leg swelling.  Gastrointestinal: Positive for abdominal pain, blood in stool, diarrhea and hematochezia.  Endocrine: Negative  for polydipsia and polyuria.  Genitourinary: Negative for dysuria and hematuria.  Musculoskeletal: Negative for arthralgias.  Skin: Positive for wound (from dermatology visit). Negative for rash.  Allergic/Immunologic: Negative for environmental allergies.  Neurological: Negative for tremors, numbness and headaches.  Psychiatric/Behavioral: Negative for dysphoric mood.    Patient Active Problem List   Diagnosis Date Noted  . Adiposity 06/29/2015  . Pulmonary embolism ([place]) 06/29/2015  . Type 2 diabetes mellitus without complication, without long-term current use of insulin ([place]) 03/06/2015  . Arthritis, degenerative 12/05/2014  . Breathlessness on exertion 10/06/2014  . DDD (degenerative disc disease), lumbar 08/29/2014  . Dyslipidemia 08/29/2014  . Essential (primary) hypertension 08/29/2014  . Acid reflux 08/29/2014  . Herpes 08/29/2014  . Obstructive sleep apnea of adult 08/29/2014  . Depression, major, recurrent, in partial remission ([place]) 08/29/2014  . Headache, tension-type 08/29/2014  . Dermatophytosis of groin 08/29/2014  . History of colon polyps 02/19/2008  . Personal history of malignant neoplasm of breast 02/19/1991    Prior to Admission medications   Medication Sig Start Date End Date Taking? Authorizing Provider  amLODipine (NORVASC) 2.5 MG tablet TAKE 1 TABLET BY MOUTH EVERY DAY 01/17/16  Yes Glean Hess, MD  aspirin 81 MG tablet Take 1 tablet by mouth daily.   Yes Historical Provider, MD  atorvastatin (LIPITOR) 10 MG tablet Take 1 tablet (10 mg total) by mouth daily. 01/01/16  Yes Glean Hess, MD  BIOTIN 5000 PO Take by mouth.   Yes Historical Provider, MD  Calcium Carb-Cholecalciferol (CALCIUM + D3) 600-200 MG-UNIT TABS Take 1 tablet by mouth daily.   Yes Historical Provider, MD  Coenzyme Q10 (COQ10) 200 MG CAPS Take 1 capsule by mouth daily.  Yes Historical Provider, MD  desloratadine (CLARINEX) 5 MG tablet TAKE 1 TABLET BY MOUTH EVERY DAY 07/12/15   Yes Glean Hess, MD  escitalopram (LEXAPRO) 10 MG tablet TAKE 1 TABLET BY MOUTH EVERY DAY 07/12/15  Yes Glean Hess, MD  hydrochlorothiazide (HYDRODIURIL) 25 MG tablet TAKE 1 TABLET BY MOUTH ONCE DAILY 02/21/16  Yes Glean Hess, MD  lisinopril (PRINIVIL,ZESTRIL) 20 MG tablet TAKE 1 TABLET BY MOUTH EVERY DAY 06/12/15  Yes Glean Hess, MD  nystatin cream (MYCOSTATIN) Apply topically 2 (two) times daily. 09/01/15  Yes Glean Hess, MD  Omega-3 Fatty Acids (FISH OIL) 1200 MG CPDR Take by mouth.   Yes Historical Provider, MD  omeprazole (PRILOSEC) 20 MG capsule TAKE 1 CAPSULE BY MOUTH EVERY DAY 07/12/15  Yes Glean Hess, MD  oxaprozin (DAYPRO) 600 MG tablet TAKE 1 TABLET BY MOUTH TWICE A DAY 07/12/15  Yes Glean Hess, MD  triamcinolone cream (KENALOG) 0.1 % Apply 1 application topically 2 (two) times daily.   Yes Historical Provider, MD    Allergies  Allergen Reactions  . Prochlorperazine   . Pneumococcal Vaccine Rash  . Tetanus Toxoids Rash    Past Surgical History:  Procedure Laterality Date  . BUNIONECTOMY Bilateral   . CARPAL TUNNEL RELEASE Left   . LUMBAR LAMINECTOMY    . MASTECTOMY MODIFIED RADICAL Bilateral 1993  . PARTIAL HYSTERECTOMY    . TOTAL KNEE ARTHROPLASTY Bilateral   . TOTAL SHOULDER REPLACEMENT Bilateral     Social History  Substance Use Topics  . Smoking status: Never Smoker  . Smokeless tobacco: Never Used  . Alcohol use 1.2 oz/week    2 Standard drinks or equivalent per week     Comment: occasional     Medication list has been reviewed and updated.   Physical Exam  Constitutional: She is oriented to person, place, and time. She appears well-developed. No distress.  HENT:  Head: Normocephalic and atraumatic.  Neck: Normal range of motion. Neck supple.  Cardiovascular: Normal rate, regular rhythm and normal heart sounds.   Pulmonary/Chest: Effort normal and breath sounds normal. No respiratory distress. She has no wheezes.    Abdominal: Soft. Normal appearance and bowel sounds are normal. There is tenderness in the left lower quadrant. There is no rigidity and no guarding.  Musculoskeletal: She exhibits no edema or tenderness.  Neurological: She is alert and oriented to person, place, and time.  Skin: Skin is warm and dry. No rash noted.  Several lesions from cryotherapy  Psychiatric: She has a normal mood and affect. Her behavior is normal. Thought content normal.  Nursing note and vitals reviewed.   BP 132/68 (BP Location: Right Arm, Patient Position: Sitting, Cuff Size: Large)   Pulse 80   Ht 5\' 3"  (1.6 m)   Wt 228 lb (103.4 kg)   BMI 40.39 kg/m   Assessment and Plan: 1. Essential (primary) hypertension controlled  2. Type 2 diabetes mellitus without complication, without long-term current use of insulin (HCC) Continue diet; no need for FSBS yet - Basic metabolic panel - Hemoglobin A1c  3. Diverticulitis of large intestine without perforation or abscess with bleeding Short course of antibiotics indicated   Meds ordered this encounter  Medications  . amoxicillin-clavulanate (AUGMENTIN) 875-125 MG tablet    Sig: Take 1 tablet by mouth 2 (two) times daily.    Dispense:  10 tablet    Refill:  0    Halina Maidens, MD [facility]  05/01/2016

## 2016-05-02 LAB — BASIC METABOLIC PANEL
BUN/Creatinine Ratio: 15 (ref 12–28)
BUN: 14 mg/dL (ref 8–27)
CO2: 24 mmol/L (ref 18–29)
Calcium: 9.2 mg/dL (ref 8.7–10.3)
Chloride: 95 mmol/L — ABNORMAL LOW (ref 96–106)
Creatinine, Ser: 0.93 mg/dL (ref 0.57–1.00)
GFR calc Af Amer: 70 mL/min/{1.73_m2} (ref >59)
GFR calc non Af Amer: 61 mL/min/{1.73_m2} (ref >59)
Glucose: 138 mg/dL — ABNORMAL HIGH (ref 65–99)
Potassium: 4.1 mmol/L (ref 3.5–5.2)
Sodium: 134 mmol/L (ref 134–144)

## 2016-05-02 LAB — HEMOGLOBIN A1C
Est. average glucose Bld gHb Est-mCnc: 148 mg/dL
Hgb A1c MFr Bld: 6.8 % — ABNORMAL HIGH (ref 4.8–5.6)

## 2016-05-07 ENCOUNTER — Telehealth: Payer: Self-pay | Admitting: Internal Medicine

## 2016-05-07 NOTE — Telephone Encounter (Signed)
Spoke to pt and she states she did not have bone density scan done??--

## 2016-05-07 NOTE — Telephone Encounter (Signed)
Please let patient know that her DEXA was normal.

## 2016-05-07 NOTE — Telephone Encounter (Signed)
Sorry,  It was the one from 2006.  I did not catch the date.

## 2016-06-04 DIAGNOSIS — M25511 Pain in right shoulder: Secondary | ICD-10-CM | POA: Diagnosis not present

## 2016-06-28 ENCOUNTER — Other Ambulatory Visit: Payer: Self-pay | Admitting: Internal Medicine

## 2016-06-28 DIAGNOSIS — E785 Hyperlipidemia, unspecified: Secondary | ICD-10-CM

## 2016-07-01 ENCOUNTER — Encounter: Payer: Self-pay | Admitting: Internal Medicine

## 2016-07-01 ENCOUNTER — Other Ambulatory Visit: Payer: Self-pay | Admitting: Internal Medicine

## 2016-07-01 ENCOUNTER — Ambulatory Visit
Admission: RE | Admit: 2016-07-01 | Discharge: 2016-07-01 | Disposition: A | Discharge status: Home / Self Care | Payer: Medicare Other | Source: Ambulatory Visit | Attending: Internal Medicine | Admitting: Internal Medicine

## 2016-07-01 ENCOUNTER — Ambulatory Visit (INDEPENDENT_AMBULATORY_CARE_PROVIDER_SITE_OTHER): Payer: Medicare Other | Admitting: Internal Medicine

## 2016-07-01 VITALS — BP 152/72 | HR 67 | Ht 63.0 in | Wt 225.0 lb

## 2016-07-01 DIAGNOSIS — I10 Essential (primary) hypertension: Secondary | ICD-10-CM | POA: Diagnosis not present

## 2016-07-01 DIAGNOSIS — F3341 Major depressive disorder, recurrent, in partial remission: Secondary | ICD-10-CM

## 2016-07-01 DIAGNOSIS — Z0000 Encounter for general adult medical examination without abnormal findings: Secondary | ICD-10-CM | POA: Diagnosis not present

## 2016-07-01 DIAGNOSIS — Z853 Personal history of malignant neoplasm of breast: Secondary | ICD-10-CM | POA: Diagnosis not present

## 2016-07-01 DIAGNOSIS — E119 Type 2 diabetes mellitus without complications: Secondary | ICD-10-CM | POA: Diagnosis not present

## 2016-07-01 DIAGNOSIS — S60470A Other superficial bite of right index finger, initial encounter: Secondary | ICD-10-CM | POA: Diagnosis not present

## 2016-07-01 DIAGNOSIS — Z23 Encounter for immunization: Secondary | ICD-10-CM

## 2016-07-01 DIAGNOSIS — K219 Gastro-esophageal reflux disease without esophagitis: Secondary | ICD-10-CM | POA: Diagnosis not present

## 2016-07-01 DIAGNOSIS — Z9013 Acquired absence of bilateral breasts and nipples: Secondary | ICD-10-CM | POA: Diagnosis not present

## 2016-07-01 DIAGNOSIS — T148XXA Other injury of unspecified body region, initial encounter: Secondary | ICD-10-CM

## 2016-07-01 DIAGNOSIS — E785 Hyperlipidemia, unspecified: Secondary | ICD-10-CM

## 2016-07-01 LAB — POCT URINALYSIS DIPSTICK
Bilirubin, UA: NEGATIVE
Blood, UA: NEGATIVE
Glucose, UA: NEGATIVE
Ketones, UA: NEGATIVE
Leukocytes, UA: NEGATIVE
Nitrite, UA: NEGATIVE
Protein, UA: NEGATIVE
Spec Grav, UA: 1.015 (ref 1.010–1.025)
Urobilinogen, UA: 0.2 E.U./dL
pH, UA: 6 (ref 5.0–8.0)

## 2016-07-01 NOTE — Patient Instructions (Addendum)
Health Maintenance  Topic Date Due  . TETANUS/TDAP  08/26/1960  . PNA vac Low Risk Adult (2 of 2 - PPSV23) 06/27/2015  . OPHTHALMOLOGY EXAM  01/03/2016  . FOOT EXAM  06/28/2016  . INFLUENZA VACCINE  09/18/2016  . HEMOGLOBIN A1C  11/01/2016  . COLONOSCOPY  06/19/2023  . DEXA SCAN  Completed   Will plan PPV-23 next visit

## 2016-07-01 NOTE — Progress Notes (Signed)
Patient: Kimberly Walls, Female    DOB: 03-31-1940, 76 y.o.   MRN: [id number] Visit Date: 07/01/2016  Today's Provider: Halina Maidens, MD   Chief Complaint  Patient presents with  . Medicare Wellness    - Breast EXAM   Subjective:    Annual wellness visit Kimberly Walls is a 76 y.o. female who presents today for her Subsequent Annual Wellness Visit. She feels well. She reports exercising walking. She reports she is sleeping well.  She was bitten by a dog at work and is now on augmentin.  Her last tetanus was years ago.  She has had bilateral mastectomies with saline implants.  No longer getting mammograms. ----------------------------------------------------------- Diabetes  She presents for her follow-up diabetic visit. She has type 2 diabetes mellitus. Pertinent negatives for hypoglycemia include no dizziness, headaches, nervousness/anxiousness or tremors. Pertinent negatives for diabetes include no chest pain, no fatigue, no polydipsia and no polyuria. Current diabetic treatment includes diet. Meal planning includes avoidance of concentrated sweets. She participates in exercise intermittently. An ACE inhibitor/angiotensin II receptor blocker is being taken. Eye exam is current.  Hypertension  This is a chronic problem. Pertinent negatives include no chest pain, headaches, palpitations or shortness of breath.  Hyperlipidemia  This is a chronic problem. Pertinent negatives include no chest pain or shortness of breath.  Depression         This is a chronic problem.  The problem has been resolved since onset.  Associated symptoms include no fatigue, not irritable, no headaches and no suicidal ideas.  Past treatments include other medications.  Past medical history includes mental health disorder.    Lab Results  Component Value Date   HGBA1C 6.8 (H) 05/01/2016    Review of Systems  Constitutional: Negative for chills, fatigue and fever.  HENT: Negative for congestion, hearing loss,  tinnitus, trouble swallowing and voice change.   Eyes: Negative for visual disturbance.  Respiratory: Negative for cough, chest tightness, shortness of breath and wheezing.   Cardiovascular: Negative for chest pain, palpitations and leg swelling.  Gastrointestinal: Negative for abdominal pain, constipation, diarrhea and vomiting.  Endocrine: Negative for polydipsia and polyuria.  Genitourinary: Negative for dysuria, frequency, genital sores, vaginal bleeding and vaginal discharge.  Musculoskeletal: Negative for arthralgias, gait problem and joint swelling.  Skin: Negative for color change and rash.  Neurological: Negative for dizziness, tremors, light-headedness and headaches.  Hematological: Negative for adenopathy. Does not bruise/bleed easily.  Psychiatric/Behavioral: Positive for depression. Negative for dysphoric mood, sleep disturbance and suicidal ideas. The patient is not nervous/anxious.     Social History   Social History  . Marital status: Single    Spouse name: N/A  . Number of children: N/A  . Years of education: N/A   Occupational History  . Not on file.   Social History Main Topics  . Smoking status: Never Smoker  . Smokeless tobacco: Never Used  . Alcohol use 1.2 oz/week    2 Standard drinks or equivalent per week     Comment: occasional  . Drug use: No  . Sexual activity: Not on file   Other Topics Concern  . Not on file   Social History Narrative  . No narrative on file    Patient Active Problem List   Diagnosis Date Noted  . Adiposity 06/29/2015  . Pulmonary embolism ([place]) 06/29/2015  . Type 2 diabetes mellitus without complication, without long-term current use of insulin ([place]) 03/06/2015  . Arthritis, degenerative 12/05/2014  . Breathlessness on exertion  10/06/2014  . DDD (degenerative disc disease), lumbar 08/29/2014  . Dyslipidemia 08/29/2014  . Essential (primary) hypertension 08/29/2014  . Acid reflux 08/29/2014  . Herpes 08/29/2014  .  Obstructive sleep apnea of adult 08/29/2014  . Depression, major, recurrent, in partial remission ([place]) 08/29/2014  . Headache, tension-type 08/29/2014  . Dermatophytosis of groin 08/29/2014  . History of colon polyps 02/19/2008  . Personal history of malignant neoplasm of breast 02/19/1991    Past Surgical History:  Procedure Laterality Date  . BUNIONECTOMY Bilateral   . CARPAL TUNNEL RELEASE Left   . LUMBAR LAMINECTOMY    . MASTECTOMY MODIFIED RADICAL Bilateral 1993  . PARTIAL HYSTERECTOMY    . TOTAL KNEE ARTHROPLASTY Bilateral   . TOTAL SHOULDER REPLACEMENT Bilateral     Her family history includes CAD in her father; Diabetes in her father and mother; Hypertension in her mother.     Previous Medications   AMLODIPINE (NORVASC) 2.5 MG TABLET    TAKE 1 TABLET BY MOUTH EVERY DAY   ASPIRIN 81 MG TABLET    Take 1 tablet by mouth daily.   ATORVASTATIN (LIPITOR) 10 MG TABLET    TAKE ONE TABLET BY MOUTH ONE TIME DAILY    BIOTIN 5000 PO    Take by mouth.   CALCIUM CARB-CHOLECALCIFEROL (CALCIUM + D3) 600-200 MG-UNIT TABS    Take 1 tablet by mouth daily.   COENZYME Q10 (COQ10) 200 MG CAPS    Take 1 capsule by mouth daily.   CYANOCOBALAMIN 1000 MCG TABLET    Take 1,000 mcg by mouth daily.   DESLORATADINE (CLARINEX) 5 MG TABLET    TAKE 1 TABLET BY MOUTH EVERY DAY   ESCITALOPRAM (LEXAPRO) 10 MG TABLET    TAKE 1 TABLET BY MOUTH EVERY DAY   HYDROCHLOROTHIAZIDE (HYDRODIURIL) 25 MG TABLET    TAKE 1 TABLET BY MOUTH ONCE DAILY   LISINOPRIL (PRINIVIL,ZESTRIL) 20 MG TABLET    TAKE 1 TABLET BY MOUTH EVERY DAY   MAGNESIUM 250 MG TABS    Take by mouth.   NYSTATIN CREAM (MYCOSTATIN)    Apply topically 2 (two) times daily.   OMEGA-3 FATTY ACIDS (FISH OIL) 1200 MG CPDR    Take by mouth.   OMEPRAZOLE (PRILOSEC) 20 MG CAPSULE    TAKE 1 CAPSULE BY MOUTH EVERY DAY   OXAPROZIN (DAYPRO) 600 MG TABLET    TAKE 1 TABLET BY MOUTH TWICE A DAY   TRIAMCINOLONE CREAM (KENALOG) 0.1 %    Apply 1 application topically 2  (two) times daily.    Patient Care Team: Glean Hess, MD as PCP - General (Family Medicine) Werner Lean (Inactive) as Referring Physician San Jetty, MD as Referring Physician (Gastroenterology)      Objective:   Vitals: BP (!) 152/72   Pulse 67   Ht 5\' 3"  (1.6 m)   Wt 225 lb (102.1 kg)   SpO2 97%   BMI 39.86 kg/m   Physical Exam  Constitutional: She is oriented to person, place, and time. She appears well-developed and well-nourished. She is not irritable. No distress.  HENT:  Head: Normocephalic and atraumatic.  Right Ear: Tympanic membrane and ear canal normal.  Left Ear: Tympanic membrane and ear canal normal.  Nose: Right sinus exhibits no maxillary sinus tenderness. Left sinus exhibits no maxillary sinus tenderness.  Mouth/Throat: Uvula is midline and oropharynx is clear and moist.  Eyes: Conjunctivae and EOM are normal. Right eye exhibits no discharge. Left eye exhibits no discharge. No scleral icterus.  Neck: Normal range of motion. Carotid bruit is not present. No erythema present. No thyromegaly present.  Cardiovascular: Normal rate, regular rhythm, normal heart sounds and normal pulses.   Pulmonary/Chest: Effort normal. No respiratory distress. She has no wheezes. Right breast exhibits no mass and no tenderness. Left breast exhibits no mass and no tenderness.  Bilateral mastectomies with saline implants  Abdominal: Soft. Bowel sounds are normal. There is no hepatosplenomegaly. There is no tenderness. There is no CVA tenderness.  Musculoskeletal: Normal range of motion.  Lymphadenopathy:    She has no cervical adenopathy.    She has no axillary adenopathy.  Neurological: She is alert and oriented to person, place, and time. She has normal reflexes. No cranial nerve deficit or sensory deficit.  Skin: Skin is warm, dry and intact. No rash noted.  2 puncture wounds on right index finger - red, tender, no drainage  Psychiatric: She has a normal mood and  affect. Her speech is normal and behavior is normal. Thought content normal. Cognition and memory are normal.  Nursing note and vitals reviewed.   Activities of Daily Living In your present state of health, do you have any difficulty performing the following activities: 07/01/2016 01/01/2016  Hearing? N N  Vision? N N  Difficulty concentrating or making decisions? N N  Walking or climbing stairs? N N  Dressing or bathing? N N  Doing errands, shopping? N N  Preparing Food and eating ? N -  Using the Toilet? N -  In the past six months, have you accidently leaked urine? N -  Do you have problems with loss of bowel control? N -  Managing your Medications? N -  Managing your Finances? N -  Housekeeping or managing your Housekeeping? N -  Some recent data might be hidden    Fall Risk Assessment Fall Risk  07/01/2016 06/29/2015 12/05/2014  Falls in the past year? No No No      Depression Screen PHQ 2/9 Scores 07/01/2016 06/29/2015 12/05/2014  PHQ - 2 Score 0 0 0   6CIT Screen 07/01/2016  What Year? 0 points  What month? 0 points  What time? 0 points  Count back from 20 0 points  Months in reverse 0 points  Repeat phrase 0 points  Total Score 0    Medicare Annual Wellness Visit Summary:  Reviewed patient's Family Medical History Reviewed and updated list of patient's medical providers Assessment of cognitive impairment was done Assessed patient's functional ability Established a written schedule for health screening [place] Completed and Reviewed  Exercise Activities and Dietary recommendations Goals    None      Immunization History  Administered Date(s) Administered  . Influenza,inj,Quad PF,36+ Mos 01/01/2016  . Influenza-Unspecified 11/25/2014  . Pneumococcal Conjugate-13 06/27/2014  . Tdap 07/01/2016    Health Maintenance  Topic Date Due  . PNA vac Low Risk Adult (2 of 2 - PPSV23) 06/27/2015  . OPHTHALMOLOGY EXAM  01/03/2016  .  INFLUENZA VACCINE  09/18/2016  . HEMOGLOBIN A1C  11/01/2016  . FOOT EXAM  07/01/2017  . COLONOSCOPY  06/19/2023  . TETANUS/TDAP  07/02/2026  . DEXA SCAN  Completed    Discussed health benefits of physical activity, and encouraged her to engage in regular exercise appropriate for her age and condition.    ------------------------------------------------------------------------------------------------------------  Assessment & Plan:  1. Medicare annual wellness visit, subsequent Measures satisfied - POCT urinalysis dipstick  2. Essential (primary) hypertension controlled - CBC with Differential/Platelet  3. Type  2 diabetes mellitus without complication, without long-term current use of insulin (HCC) Continue medications Encouraged exercise - Comprehensive metabolic panel - Hemoglobin A1c - Microalbumin / creatinine urine ratio  4. Depression, major, recurrent, in partial remission ([place]) controlled - TSH  5. Dyslipidemia On statin therapy - Lipid panel  6. Personal history of malignant neoplasm of breast - DG Chest 2 View; Future  7. Bite by animal Finish course of augmentin - Tdap vaccine greater than or equal to 7yo IM  8. Gastroesophageal reflux disease, esophagitis presence not specified stable   No orders of the defined types were placed in this encounter.   Halina Maidens, MD [facility]  07/01/2016

## 2016-07-02 ENCOUNTER — Other Ambulatory Visit: Payer: Self-pay | Admitting: Internal Medicine

## 2016-07-02 LAB — LIPID PANEL
Chol/HDL Ratio: 3.8 ratio (ref 0.0–4.4)
Cholesterol, Total: 152 mg/dL (ref 100–199)
HDL: 40 mg/dL (ref >39)
LDL Calculated: 70 mg/dL (ref 0–99)
Triglycerides: 212 mg/dL — ABNORMAL HIGH (ref 0–149)
VLDL Cholesterol Cal: 42 mg/dL — ABNORMAL HIGH (ref 5–40)

## 2016-07-02 LAB — CBC WITH DIFFERENTIAL/PLATELET
Basophils Absolute: 0 10*3/uL (ref 0.0–0.2)
Basos: 1 %
EOS (ABSOLUTE): 0.4 10*3/uL (ref 0.0–0.4)
Eos: 5 %
Hematocrit: 44 % (ref 34.0–46.6)
Hemoglobin: 14.4 g/dL (ref 11.1–15.9)
Immature Grans (Abs): 0 10*3/uL (ref 0.0–0.1)
Immature Granulocytes: 0 %
Lymphocytes Absolute: 2.4 10*3/uL (ref 0.7–3.1)
Lymphs: 29 %
MCH: 29 pg (ref 26.6–33.0)
MCHC: 32.7 g/dL (ref 31.5–35.7)
MCV: 89 fL (ref 79–97)
Monocytes Absolute: 0.9 10*3/uL (ref 0.1–0.9)
Monocytes: 11 %
Neutrophils Absolute: 4.5 10*3/uL (ref 1.4–7.0)
Neutrophils: 54 %
Platelets: 248 10*3/uL (ref 150–379)
RBC: 4.97 x10E6/uL (ref 3.77–5.28)
RDW: 14.4 % (ref 12.3–15.4)
WBC: 8.2 10*3/uL (ref 3.4–10.8)

## 2016-07-02 LAB — COMPREHENSIVE METABOLIC PANEL
ALT: 24 IU/L (ref 0–32)
AST: 26 IU/L (ref 0–40)
Albumin/Globulin Ratio: 1.6 (ref 1.2–2.2)
Albumin: 4.1 g/dL (ref 3.5–4.8)
Alkaline Phosphatase: 105 IU/L (ref 39–117)
BUN/Creatinine Ratio: 24 (ref 12–28)
BUN: 19 mg/dL (ref 8–27)
Bilirubin Total: 0.5 mg/dL (ref 0.0–1.2)
CO2: 22 mmol/L (ref 18–29)
Calcium: 9.2 mg/dL (ref 8.7–10.3)
Chloride: 100 mmol/L (ref 96–106)
Creatinine, Ser: 0.79 mg/dL (ref 0.57–1.00)
GFR calc Af Amer: 85 mL/min/{1.73_m2} (ref >59)
GFR calc non Af Amer: 74 mL/min/{1.73_m2} (ref >59)
Globulin, Total: 2.6 g/dL (ref 1.5–4.5)
Glucose: 169 mg/dL — ABNORMAL HIGH (ref 65–99)
Potassium: 4.3 mmol/L (ref 3.5–5.2)
Sodium: 137 mmol/L (ref 134–144)
Total Protein: 6.7 g/dL (ref 6.0–8.5)

## 2016-07-02 LAB — HEMOGLOBIN A1C
Est. average glucose Bld gHb Est-mCnc: 177 mg/dL
Hgb A1c MFr Bld: 7.8 % — ABNORMAL HIGH (ref 4.8–5.6)

## 2016-07-02 LAB — TSH: TSH: 3.2 u[IU]/mL (ref 0.450–4.500)

## 2016-07-02 MED ORDER — METFORMIN HCL ER (MOD) 500 MG PO TB24: 500.0000 mg | ORAL_TABLET | Freq: Every day | ORAL | 5 refills | Status: DC

## 2016-07-03 LAB — MICROALBUMIN / CREATININE URINE RATIO
Creatinine, Urine: 75.5 mg/dL
Microalb/Creat Ratio: 7 mg/g creat (ref 0.0–30.0)
Microalbumin, Urine: 5.3 ug/mL

## 2016-07-29 ENCOUNTER — Other Ambulatory Visit: Payer: Self-pay | Admitting: Internal Medicine

## 2016-09-02 ENCOUNTER — Ambulatory Visit: Payer: BLUE CROSS/BLUE SHIELD | Admitting: Internal Medicine

## 2016-09-23 ENCOUNTER — Other Ambulatory Visit: Payer: Self-pay | Admitting: Internal Medicine

## 2016-10-07 ENCOUNTER — Encounter: Payer: BLUE CROSS/BLUE SHIELD | Admitting: Internal Medicine

## 2016-11-01 ENCOUNTER — Ambulatory Visit: Payer: Medicare Other | Admitting: Internal Medicine

## 2016-11-19 ENCOUNTER — Ambulatory Visit (INDEPENDENT_AMBULATORY_CARE_PROVIDER_SITE_OTHER): Payer: Medicare Other | Admitting: Internal Medicine

## 2016-11-19 ENCOUNTER — Encounter: Payer: Self-pay | Admitting: Internal Medicine

## 2016-11-19 VITALS — BP 124/80 | HR 71 | Ht 63.0 in | Wt 223.0 lb

## 2016-11-19 DIAGNOSIS — I10 Essential (primary) hypertension: Secondary | ICD-10-CM | POA: Diagnosis not present

## 2016-11-19 DIAGNOSIS — E119 Type 2 diabetes mellitus without complications: Secondary | ICD-10-CM

## 2016-11-19 DIAGNOSIS — D485 Neoplasm of uncertain behavior of skin: Secondary | ICD-10-CM

## 2016-11-19 DIAGNOSIS — E785 Hyperlipidemia, unspecified: Secondary | ICD-10-CM

## 2016-11-19 DIAGNOSIS — Z23 Encounter for immunization: Secondary | ICD-10-CM | POA: Diagnosis not present

## 2016-11-19 NOTE — Progress Notes (Signed)
Date:  11/19/2016   Name:  Kimberly Walls   DOB:  1940/02/23   MRN:  [id number]   Chief Complaint: Hypertension; Diabetes; and Immunizations (FLU SHOT- High Dose ) Hypertension  This is a chronic problem. The problem is unchanged. The problem is controlled. Pertinent negatives include no chest pain, headaches, palpitations or shortness of breath.  Diabetes  She presents for her follow-up diabetic visit. She has type 2 diabetes mellitus. Pertinent negatives for hypoglycemia include no headaches or tremors. Pertinent negatives for diabetes include no chest pain, no fatigue, no polydipsia and no polyuria.   Skin lesions - she has several lesions that she wants me to look at. One on her right anterior shin and one on the back of her right upper arm and one on her lateral left upper arm.   Review of Systems  Constitutional: Negative for appetite change, fatigue, fever and unexpected weight change.  HENT: Negative for tinnitus and trouble swallowing.   Eyes: Negative for visual disturbance.  Respiratory: Negative for cough, chest tightness and shortness of breath.   Cardiovascular: Negative for chest pain, palpitations and leg swelling.  Gastrointestinal: Negative for abdominal pain.  Endocrine: Negative for polydipsia and polyuria.  Genitourinary: Negative for dysuria and hematuria.  Musculoskeletal: Negative for arthralgias.  Skin: Positive for rash (several scattered skin lesions).  Neurological: Negative for tremors, numbness and headaches.  Psychiatric/Behavioral: Negative for dysphoric mood.    Patient Active Problem List   Diagnosis Date Noted  . Adiposity 06/29/2015  . Pulmonary embolism ([place]) 06/29/2015  . Type 2 diabetes mellitus without complication, without long-term current use of insulin ([place]) 03/06/2015  . Arthritis, degenerative 12/05/2014  . Breathlessness on exertion 10/06/2014  . DDD (degenerative disc disease), lumbar 08/29/2014  . Dyslipidemia 08/29/2014  .  Essential (primary) hypertension 08/29/2014  . Acid reflux 08/29/2014  . Herpes 08/29/2014  . Obstructive sleep apnea of adult 08/29/2014  . Depression, major, recurrent, in partial remission ([place]) 08/29/2014  . Headache, tension-type 08/29/2014  . Dermatophytosis of groin 08/29/2014  . History of colon polyps 02/19/2008  . Personal history of malignant neoplasm of breast 02/19/1991    Prior to Admission medications   Medication Sig Start Date End Date Taking? Authorizing Provider  amLODipine (NORVASC) 2.5 MG tablet TAKE 1 TABLET BY MOUTH EVERY DAY 01/17/16  Yes Glean Hess, MD  aspirin 81 MG tablet Take 1 tablet by mouth daily.   Yes [provider]  atorvastatin (LIPITOR) 10 MG tablet TAKE ONE TABLET BY MOUTH ONE TIME DAILY  06/29/16  Yes Glean Hess, MD  BIOTIN 5000 PO Take by mouth.   Yes [provider]  Calcium Carb-Cholecalciferol (CALCIUM + D3) 600-200 MG-UNIT TABS Take 1 tablet by mouth daily.   Yes [provider]  Coenzyme Q10 (COQ10) 200 MG CAPS Take 1 capsule by mouth daily.   Yes [provider]  cyanocobalamin 1000 MCG tablet Take 1,000 mcg by mouth daily.   Yes [provider]  desloratadine (CLARINEX) 5 MG tablet TAKE 1 TABLET BY MOUTH EVERY DAY 07/29/16  Yes Glean Hess, MD  escitalopram (LEXAPRO) 10 MG tablet TAKE 1 TABLET BY MOUTH EVERY DAY 07/12/15  Yes Glean Hess, MD  hydrochlorothiazide (HYDRODIURIL) 25 MG tablet TAKE 1 TABLET BY MOUTH ONCE DAILY Patient taking differently: TAKE 0.5 TABLET BY MOUTH ONCE DAILY 02/21/16  Yes Glean Hess, MD  lisinopril (PRINIVIL,ZESTRIL) 20 MG tablet TAKE 1 TABLET BY MOUTH EVERY DAY 06/29/16  Yes  Glean Hess, MD  Magnesium 250 MG TABS Take by mouth.   Yes [provider]  metFORMIN (GLUMETZA) 500 MG (MOD) 24 hr tablet Take 1 tablet (500 mg total) by mouth daily with breakfast. 07/02/16  Yes Glean Hess, MD  Omega-3 Fatty Acids (FISH OIL) 1200 MG  CPDR Take by mouth.   Yes [provider]  omeprazole (PRILOSEC) 20 MG capsule TAKE 1 CAPSULE BY MOUTH EVERY DAY 07/29/16  Yes Glean Hess, MD  oxaprozin (DAYPRO) 600 MG tablet TAKE 1 TABLET BY MOUTH TWICE A DAY 09/23/16  Yes Glean Hess, MD  triamcinolone cream (KENALOG) 0.1 % Apply 1 application topically 2 (two) times daily.   Yes [provider]    Allergies  Allergen Reactions  . Prochlorperazine   . Pneumococcal Vaccine Rash  . Tetanus Toxoids Rash    Past Surgical History:  Procedure Laterality Date  . BUNIONECTOMY Bilateral   . CARPAL TUNNEL RELEASE Left   . LUMBAR LAMINECTOMY    . MASTECTOMY MODIFIED RADICAL Bilateral 1993  . PARTIAL HYSTERECTOMY    . TOTAL KNEE ARTHROPLASTY Bilateral   . TOTAL SHOULDER REPLACEMENT Bilateral     Social History  Substance Use Topics  . Smoking status: Never Smoker  . Smokeless tobacco: Never Used  . Alcohol use 1.2 oz/week    2 Standard drinks or equivalent per week     Comment: occasional     Medication list has been reviewed and updated.  PHQ 2/9 Scores 11/19/2016 07/01/2016 06/29/2015 12/05/2014  PHQ - 2 Score 0 0 0 0  PHQ- 9 Score 0 - - -    Physical Exam  Constitutional: She is oriented to person, place, and time. She appears well-developed. No distress.  HENT:  Head: Normocephalic and atraumatic.  Neck: Normal range of motion. Neck supple. No thyromegaly present.  Cardiovascular: Normal rate, regular rhythm and normal heart sounds.   Pulmonary/Chest: Effort normal and breath sounds normal. No respiratory distress. She has no wheezes.  Musculoskeletal: Normal range of motion. She exhibits no edema or tenderness.  Neurological: She is alert and oriented to person, place, and time.  Skin: Skin is warm and dry. No rash noted.     Psychiatric: She has a normal mood and affect. Her behavior is normal. Thought content normal.  Nursing note and vitals reviewed.   BP 124/80   Pulse 71   Ht 5\' 3"   (1.6 m)   Wt 223 lb (101.2 kg)   SpO2 96%   BMI 39.50 kg/m   Assessment and Plan: 1. Essential (primary) hypertension controleed - CBC with Differential/Platelet  2. Type 2 diabetes mellitus without complication, without long-term current use of insulin (HCC) Doing well on oral agents - Comprehensive metabolic panel - Hemoglobin A1c - TSH  3. Dyslipidemia On statin therapy  4. Need for influenza vaccination - Flu vaccine HIGH DOSE PF  See Dermatology to evaluate right shin lesion.  No orders of the defined types were placed in this encounter.   Partially dictated using Editor, commissioning. Any errors are unintentional.  Halina Maidens, MD [facility]  11/19/2016

## 2016-11-20 LAB — COMPREHENSIVE METABOLIC PANEL
ALT: 26 IU/L (ref 0–32)
AST: 30 IU/L (ref 0–40)
Albumin/Globulin Ratio: 1.8 (ref 1.2–2.2)
Albumin: 4.3 g/dL (ref 3.5–4.8)
Alkaline Phosphatase: 114 IU/L (ref 39–117)
BUN/Creatinine Ratio: 21 (ref 12–28)
BUN: 18 mg/dL (ref 8–27)
Bilirubin Total: 0.5 mg/dL (ref 0.0–1.2)
CO2: 28 mmol/L (ref 20–29)
Calcium: 9.7 mg/dL (ref 8.7–10.3)
Chloride: 99 mmol/L (ref 96–106)
Creatinine, Ser: 0.85 mg/dL (ref 0.57–1.00)
GFR calc Af Amer: 78 mL/min/{1.73_m2} (ref >59)
GFR calc non Af Amer: 67 mL/min/{1.73_m2} (ref >59)
Globulin, Total: 2.4 g/dL (ref 1.5–4.5)
Glucose: 127 mg/dL — ABNORMAL HIGH (ref 65–99)
Potassium: 4.1 mmol/L (ref 3.5–5.2)
Sodium: 139 mmol/L (ref 134–144)
Total Protein: 6.7 g/dL (ref 6.0–8.5)

## 2016-11-20 LAB — CBC WITH DIFFERENTIAL/PLATELET
Basophils Absolute: 0.1 10*3/uL (ref 0.0–0.2)
Basos: 1 %
EOS (ABSOLUTE): 0.3 10*3/uL (ref 0.0–0.4)
Eos: 4 %
Hematocrit: 44.8 % (ref 34.0–46.6)
Hemoglobin: 14.8 g/dL (ref 11.1–15.9)
Immature Grans (Abs): 0 10*3/uL (ref 0.0–0.1)
Immature Granulocytes: 0 %
Lymphocytes Absolute: 2.7 10*3/uL (ref 0.7–3.1)
Lymphs: 31 %
MCH: 28.8 pg (ref 26.6–33.0)
MCHC: 33 g/dL (ref 31.5–35.7)
MCV: 87 fL (ref 79–97)
Monocytes Absolute: 1 10*3/uL — ABNORMAL HIGH (ref 0.1–0.9)
Monocytes: 11 %
Neutrophils Absolute: 4.6 10*3/uL (ref 1.4–7.0)
Neutrophils: 53 %
Platelets: 236 10*3/uL (ref 150–379)
RBC: 5.14 x10E6/uL (ref 3.77–5.28)
RDW: 14.4 % (ref 12.3–15.4)
WBC: 8.8 10*3/uL (ref 3.4–10.8)

## 2016-11-20 LAB — TSH: TSH: 2.03 u[IU]/mL (ref 0.450–4.500)

## 2016-11-20 LAB — HEMOGLOBIN A1C
Est. average glucose Bld gHb Est-mCnc: 154 mg/dL
Hgb A1c MFr Bld: 7 % — ABNORMAL HIGH (ref 4.8–5.6)

## 2016-12-23 ENCOUNTER — Other Ambulatory Visit: Payer: Self-pay | Admitting: Internal Medicine

## 2017-01-15 DIAGNOSIS — J029 Acute pharyngitis, unspecified: Secondary | ICD-10-CM | POA: Diagnosis not present

## 2017-01-15 DIAGNOSIS — J020 Streptococcal pharyngitis: Secondary | ICD-10-CM | POA: Diagnosis not present

## 2017-01-20 ENCOUNTER — Other Ambulatory Visit: Payer: Self-pay | Admitting: Internal Medicine

## 2017-02-14 DIAGNOSIS — E119 Type 2 diabetes mellitus without complications: Secondary | ICD-10-CM | POA: Diagnosis not present

## 2017-02-14 DIAGNOSIS — H527 Unspecified disorder of refraction: Secondary | ICD-10-CM | POA: Diagnosis not present

## 2017-02-14 DIAGNOSIS — H2513 Age-related nuclear cataract, bilateral: Secondary | ICD-10-CM | POA: Diagnosis not present

## 2017-02-14 DIAGNOSIS — D3132 Benign neoplasm of left choroid: Secondary | ICD-10-CM | POA: Diagnosis not present

## 2017-02-14 DIAGNOSIS — H019 Unspecified inflammation of eyelid: Secondary | ICD-10-CM | POA: Diagnosis not present

## 2017-02-14 LAB — HM DIABETES EYE EXAM

## 2017-03-25 ENCOUNTER — Other Ambulatory Visit: Payer: Self-pay | Admitting: Internal Medicine

## 2017-03-25 ENCOUNTER — Encounter: Payer: Self-pay | Admitting: Internal Medicine

## 2017-03-25 ENCOUNTER — Ambulatory Visit (INDEPENDENT_AMBULATORY_CARE_PROVIDER_SITE_OTHER): Payer: Medicare Other | Admitting: Internal Medicine

## 2017-03-25 VITALS — BP 142/80 | HR 81 | Ht 63.0 in | Wt 216.0 lb

## 2017-03-25 DIAGNOSIS — F3341 Major depressive disorder, recurrent, in partial remission: Secondary | ICD-10-CM

## 2017-03-25 DIAGNOSIS — I10 Essential (primary) hypertension: Secondary | ICD-10-CM | POA: Diagnosis not present

## 2017-03-25 DIAGNOSIS — E119 Type 2 diabetes mellitus without complications: Secondary | ICD-10-CM

## 2017-03-25 MED ORDER — ESCITALOPRAM OXALATE 10 MG PO TABS: 10.0000 mg | ORAL_TABLET | Freq: Every day | ORAL | 3 refills | Status: DC

## 2017-03-25 NOTE — Progress Notes (Signed)
Date:  03/25/2017   Name:  Kimberly Walls   DOB:  09-29-1940   MRN:  [id number]   Chief Complaint: Diabetes Diabetes  She presents for her follow-up diabetic visit. She has type 2 diabetes mellitus. Pertinent negatives for hypoglycemia include no headaches or tremors. Pertinent negatives for diabetes include no chest pain, no fatigue, no polydipsia and no polyuria. She is following a generally healthy diet. Blood glucose monitoring compliance: not checking BS. An ACE inhibitor/angiotensin II receptor blocker is being taken. Eye exam is current.  Hypertension  This is a chronic problem. The problem is controlled. Pertinent negatives include no chest pain, headaches, palpitations or shortness of breath. Past treatments include diuretics, calcium channel blockers and ACE inhibitors. The current treatment provides significant improvement.  Depression         This is a chronic problem.  The problem occurs rarely.  Associated symptoms include no fatigue, no appetite change and no headaches.  Past treatments include SSRIs - Selective serotonin reuptake inhibitors.  Previous treatment provided significant relief.   Lab Results  Component Value Date   HGBA1C 7.0 (H) 11/19/2016     Review of Systems  Constitutional: Negative for appetite change, fatigue, fever and unexpected weight change.  HENT: Negative for tinnitus and trouble swallowing.   Eyes: Negative for visual disturbance.  Respiratory: Negative for cough, chest tightness and shortness of breath.   Cardiovascular: Negative for chest pain, palpitations and leg swelling.  Gastrointestinal: Negative for abdominal pain.  Endocrine: Negative for polydipsia and polyuria.  Genitourinary: Negative for dysuria and hematuria.  Musculoskeletal: Negative for arthralgias.  Neurological: Negative for tremors, numbness and headaches.  Psychiatric/Behavioral: Positive for depression. Negative for dysphoric mood.    Patient Active Problem List   Diagnosis Date Noted  . Adiposity 06/29/2015  . Pulmonary embolism ([place]) 06/29/2015  . Type 2 diabetes mellitus without complication, without long-term current use of insulin ([place]) 03/06/2015  . Arthritis, degenerative 12/05/2014  . Breathlessness on exertion 10/06/2014  . DDD (degenerative disc disease), lumbar 08/29/2014  . Dyslipidemia 08/29/2014  . Essential (primary) hypertension 08/29/2014  . Acid reflux 08/29/2014  . Herpes 08/29/2014  . Obstructive sleep apnea of adult 08/29/2014  . Depression, major, recurrent, in partial remission ([place]) 08/29/2014  . Headache, tension-type 08/29/2014  . Dermatophytosis of groin 08/29/2014  . History of colon polyps 02/19/2008  . Personal history of malignant neoplasm of breast 02/19/1991    Prior to Admission medications   Medication Sig Start Date End Date Taking? Authorizing Provider  amLODipine (NORVASC) 2.5 MG tablet TAKE ONE TABLET BY MOUTH ONE TIME DAILY  01/20/17  Yes Glean Hess, MD  aspirin 81 MG tablet Take 1 tablet by mouth daily.   Yes [provider]  atorvastatin (LIPITOR) 10 MG tablet TAKE ONE TABLET BY MOUTH ONE TIME DAILY  06/29/16  Yes Glean Hess, MD  BIOTIN 5000 PO Take by mouth.   Yes [provider]  Calcium Carb-Cholecalciferol (CALCIUM + D3) 600-200 MG-UNIT TABS Take 1 tablet by mouth daily.   Yes [provider]  Coenzyme Q10 (COQ10) 200 MG CAPS Take 1 capsule by mouth daily.   Yes [provider]  cyanocobalamin 1000 MCG tablet Take 1,000 mcg by mouth daily.   Yes [provider]  desloratadine (CLARINEX) 5 MG tablet TAKE 1 TABLET BY MOUTH EVERY DAY 07/29/16  Yes Glean Hess, MD  escitalopram (LEXAPRO) 10 MG tablet TAKE 1 TABLET BY MOUTH EVERY DAY 07/12/15  Yes  Glean Hess, MD  hydrochlorothiazide (HYDRODIURIL) 25 MG tablet TAKE 1 TABLET BY MOUTH ONCE DAILY Patient taking differently: TAKE 0.5 TABLET BY MOUTH ONCE DAILY 02/21/16  Yes Glean Hess, MD   lisinopril (PRINIVIL,ZESTRIL) 20 MG tablet TAKE 1 TABLET BY MOUTH EVERY DAY 06/29/16  Yes Glean Hess, MD  Magnesium 250 MG TABS Take by mouth.   Yes [provider]  metFORMIN (GLUCOPHAGE-XR) 500 MG 24 hr tablet TAKE ONE TABLET BY MOUTH ONE TIME DAILY WITH BREAKFAST 12/23/16  Yes Glean Hess, MD  nystatin cream (MYCOSTATIN) Apply 1 application topically 2 (two) times daily.   Yes [provider]  Omega-3 Fatty Acids (FISH OIL) 1200 MG CPDR Take by mouth.   Yes [provider]  omeprazole (PRILOSEC) 20 MG capsule TAKE 1 CAPSULE BY MOUTH EVERY DAY 07/29/16  Yes Glean Hess, MD  oxaprozin (DAYPRO) 600 MG tablet TAKE 1 TABLET BY MOUTH TWICE A DAY 09/23/16  Yes Glean Hess, MD  pyridoxine (B-6) 100 MG tablet Take 100 mg by mouth daily.   Yes [provider]    Allergies  Allergen Reactions  . Prochlorperazine   . Pneumococcal Vaccine Rash  . Tetanus Toxoids Rash    Past Surgical History:  Procedure Laterality Date  . BUNIONECTOMY Bilateral   . CARPAL TUNNEL RELEASE Left   . LUMBAR LAMINECTOMY    . MASTECTOMY MODIFIED RADICAL Bilateral 1993  . PARTIAL HYSTERECTOMY    . TOTAL KNEE ARTHROPLASTY Bilateral   . TOTAL SHOULDER REPLACEMENT Bilateral     Social History   Tobacco Use  . Smoking status: Never Smoker  . Smokeless tobacco: Never Used  Substance Use Topics  . Alcohol use: Yes    Alcohol/week: 1.2 oz    Types: 2 Standard drinks or equivalent per week    Comment: occasional  . Drug use: No     Medication list has been reviewed and updated.  PHQ 2/9 Scores 03/25/2017 11/19/2016 07/01/2016 06/29/2015  PHQ - 2 Score 0 0 0 0  PHQ- 9 Score 0 0 - -    Physical Exam  Constitutional: She is oriented to person, place, and time. She appears well-developed. No distress.  HENT:  Head: Normocephalic and atraumatic.  Neck: Normal range of motion.  Cardiovascular: Normal rate, regular rhythm and normal heart sounds.    Pulmonary/Chest: Effort normal and breath sounds normal. No respiratory distress. She has no wheezes.  Musculoskeletal: Normal range of motion. She exhibits no edema or tenderness.  Neurological: She is alert and oriented to person, place, and time.  Skin: Skin is warm and dry. No rash noted.  Psychiatric: She has a normal mood and affect. Her behavior is normal. Thought content normal.  Nursing note and vitals reviewed.   BP (!) 142/80   Pulse 81   Ht 5\' 3"  (1.6 m)   Wt 216 lb (98 kg)   SpO2 97%   BMI 38.26 kg/m   Assessment and Plan: 1. Type 2 diabetes mellitus without complication, without long-term current use of insulin (HCC) Continue medications - Hemoglobin A1c - Microalbumin / creatinine urine ratio  2. Depression, major, recurrent, in partial remission ([place]) controlled - escitalopram (LEXAPRO) 10 MG tablet; Take 1 tablet (10 mg total) by mouth daily.  Dispense: 90 tablet; Refill: 3  3. Essential (primary) hypertension controlled   Meds ordered this encounter  Medications  . escitalopram (LEXAPRO) 10 MG tablet    Sig: Take 1 tablet (10 mg total) by mouth  daily.    Dispense:  90 tablet    Refill:  3    Partially dictated using Editor, commissioning. Any errors are unintentional.  Halina Maidens, MD [facility]  03/25/2017

## 2017-03-26 LAB — HEMOGLOBIN A1C
Est. average glucose Bld gHb Est-mCnc: 154 mg/dL
Hgb A1c MFr Bld: 7 % — ABNORMAL HIGH (ref 4.8–5.6)

## 2017-03-28 LAB — MICROALBUMIN / CREATININE URINE RATIO
Creatinine, Urine: 27.4 mg/dL
Microalb/Creat Ratio: <10.9 mg/g creat (ref 0.0–30.0)
Microalbumin, Urine: <3 ug/mL

## 2017-04-03 DIAGNOSIS — D225 Melanocytic nevi of trunk: Secondary | ICD-10-CM | POA: Diagnosis not present

## 2017-04-03 DIAGNOSIS — D224 Melanocytic nevi of scalp and neck: Secondary | ICD-10-CM | POA: Diagnosis not present

## 2017-04-03 DIAGNOSIS — L570 Actinic keratosis: Secondary | ICD-10-CM | POA: Diagnosis not present

## 2017-04-03 DIAGNOSIS — D2230 Melanocytic nevi of unspecified part of face: Secondary | ICD-10-CM | POA: Diagnosis not present

## 2017-06-03 DIAGNOSIS — L905 Scar conditions and fibrosis of skin: Secondary | ICD-10-CM | POA: Diagnosis not present

## 2017-06-03 DIAGNOSIS — Z853 Personal history of malignant neoplasm of breast: Secondary | ICD-10-CM | POA: Diagnosis not present

## 2017-06-03 DIAGNOSIS — R0789 Other chest pain: Secondary | ICD-10-CM | POA: Diagnosis not present

## 2017-06-19 ENCOUNTER — Other Ambulatory Visit: Payer: Self-pay | Admitting: Internal Medicine

## 2017-06-24 DIAGNOSIS — L905 Scar conditions and fibrosis of skin: Secondary | ICD-10-CM | POA: Diagnosis not present

## 2017-06-24 DIAGNOSIS — Z853 Personal history of malignant neoplasm of breast: Secondary | ICD-10-CM | POA: Diagnosis not present

## 2017-06-24 DIAGNOSIS — R0789 Other chest pain: Secondary | ICD-10-CM | POA: Diagnosis not present

## 2017-07-07 ENCOUNTER — Ambulatory Visit (INDEPENDENT_AMBULATORY_CARE_PROVIDER_SITE_OTHER): Payer: Medicare Other | Admitting: Internal Medicine

## 2017-07-07 ENCOUNTER — Encounter: Payer: Self-pay | Admitting: Internal Medicine

## 2017-07-07 ENCOUNTER — Ambulatory Visit (INDEPENDENT_AMBULATORY_CARE_PROVIDER_SITE_OTHER): Payer: Medicare Other

## 2017-07-07 VITALS — BP 146/80 | HR 70 | Temp 99.0°F | Resp 16 | Ht 63.0 in | Wt 218.0 lb

## 2017-07-07 VITALS — BP 168/80 | HR 70 | Temp 99.0°F | Resp 14 | Ht 63.0 in | Wt 218.0 lb

## 2017-07-07 DIAGNOSIS — I10 Essential (primary) hypertension: Secondary | ICD-10-CM | POA: Diagnosis not present

## 2017-07-07 DIAGNOSIS — Z0000 Encounter for general adult medical examination without abnormal findings: Secondary | ICD-10-CM

## 2017-07-07 DIAGNOSIS — J028 Acute pharyngitis due to other specified organisms: Secondary | ICD-10-CM

## 2017-07-07 MED ORDER — GUAIFENESIN-CODEINE 100-10 MG/5ML PO SYRP: 5.0000 mL | ORAL_SOLUTION | Freq: Three times a day (TID) | ORAL | 0 refills | Status: DC | PRN

## 2017-07-07 MED ORDER — AMOXICILLIN 875 MG PO TABS: 875.0000 mg | ORAL_TABLET | Freq: Two times a day (BID) | ORAL | 0 refills | Status: DC

## 2017-07-07 NOTE — Progress Notes (Signed)
Date:  07/07/2017   Name:  Kimberly Walls   DOB:  1940/04/03   MRN:  [id number]   Chief Complaint: Nasal Congestion (has cold symptoms x 3 days sore throat and headache also present. )  Sore Throat   This is a new problem. The current episode started in the past 7 days. There has been no fever (99). The pain is moderate. Associated symptoms include congestion and coughing. Pertinent negatives include no diarrhea, headaches, shortness of breath, trouble swallowing or vomiting. She has had no exposure to strep. She has tried nothing for the symptoms.      Review of Systems  Constitutional: Positive for fever (low grade). Negative for chills and fatigue.  HENT: Positive for congestion, rhinorrhea, sneezing and sore throat. Negative for trouble swallowing.   Respiratory: Positive for cough. Negative for chest tightness, shortness of breath and wheezing.   Cardiovascular: Negative for chest pain and palpitations.  Gastrointestinal: Negative for diarrhea and vomiting.  Neurological: Negative for dizziness and headaches.  Hematological: Negative for adenopathy.    Patient Active Problem List   Diagnosis Date Noted  . Adiposity 06/29/2015  . Pulmonary embolism ([place]) 06/29/2015  . Type 2 diabetes mellitus without complication, without long-term current use of insulin ([place]) 03/06/2015  . Arthritis, degenerative 12/05/2014  . Breathlessness on exertion 10/06/2014  . DDD (degenerative disc disease), lumbar 08/29/2014  . Dyslipidemia 08/29/2014  . Essential (primary) hypertension 08/29/2014  . Acid reflux 08/29/2014  . Herpes 08/29/2014  . Obstructive sleep apnea of adult 08/29/2014  . Depression, major, recurrent, in partial remission ([place]) 08/29/2014  . Headache, tension-type 08/29/2014  . Dermatophytosis of groin 08/29/2014  . History of colon polyps 02/19/2008  . Personal history of malignant neoplasm of breast 02/19/1991    Prior to Admission medications   Medication Sig Start  Date End Date Taking? Authorizing Provider  amLODipine (NORVASC) 2.5 MG tablet TAKE ONE TABLET BY MOUTH ONE TIME DAILY  01/20/17  Yes Glean Hess, MD  ascorbic acid (VITAMIN C) 250 MG tablet Take 250 mg by mouth daily.   Yes [provider]  aspirin 81 MG tablet Take 1 tablet by mouth daily.   Yes [provider]  atorvastatin (LIPITOR) 10 MG tablet TAKE ONE TABLET BY MOUTH ONE TIME DAILY  06/29/16  Yes Glean Hess, MD  BIOTIN 5000 PO Take 1 tablet by mouth daily.    Yes [provider]  Calcium Carb-Cholecalciferol (CALCIUM + D3) 600-200 MG-UNIT TABS Take 1 tablet by mouth daily.   Yes [provider]  Cinnamon 500 MG TABS Take 2 tablets by mouth daily.   Yes [provider]  Coenzyme Q10 (COQ10) 200 MG CAPS Take 1 capsule by mouth daily.   Yes [provider]  cyanocobalamin 1000 MCG tablet Take 1,000 mcg by mouth daily.   Yes [provider]  desloratadine (CLARINEX) 5 MG tablet TAKE 1 TABLET BY MOUTH EVERY DAY 07/29/16  Yes Glean Hess, MD  escitalopram (LEXAPRO) 10 MG tablet Take 1 tablet (10 mg total) by mouth daily. 03/25/17  Yes Glean Hess, MD  hydrochlorothiazide (HYDRODIURIL) 25 MG tablet TAKE 1 TABLET BY MOUTH ONCE DAILY Patient taking differently: TAKE 0.5 TABLET BY MOUTH ONCE DAILY 02/21/16  Yes Glean Hess, MD  lisinopril (PRINIVIL,ZESTRIL) 20 MG tablet TAKE 1 TABLET BY MOUTH EVERY DAY 06/29/16  Yes Glean Hess, MD  Magnesium 400 MG CAPS Take 1 tablet by mouth daily.    Yes  [provider]  metFORMIN (GLUCOPHAGE-XR) 500 MG 24 hr tablet TAKE 1 TABLET BY MOUTH ONCE A DAY WITH BREAKFAST 06/19/17  Yes Glean Hess, MD  nystatin cream (MYCOSTATIN) Apply 1 application topically 2 (two) times daily.   Yes [provider]  Omega-3 Fatty Acids (FISH OIL) 1200 MG CPDR Take by mouth.   Yes [provider]  omeprazole (PRILOSEC) 20 MG capsule TAKE 1 CAPSULE BY MOUTH EVERY DAY  07/29/16  Yes Glean Hess, MD  oxaprozin (DAYPRO) 600 MG tablet TAKE 1 TABLET BY MOUTH TWICE A DAY 09/23/16  Yes Glean Hess, MD  amoxicillin (AMOXIL) 500 MG tablet Take 500 mg by mouth as needed (prior to dental appt).    [provider]    Allergies  Allergen Reactions  . Prochlorperazine   . Pneumococcal Vaccine Rash  . Tetanus Toxoids Rash    Past Surgical History:  Procedure Laterality Date  . BUNIONECTOMY Bilateral   . CARPAL TUNNEL RELEASE Left   . LUMBAR LAMINECTOMY    . MASTECTOMY MODIFIED RADICAL Bilateral 1993  . PARTIAL HYSTERECTOMY    . TOTAL KNEE ARTHROPLASTY Bilateral   . TOTAL SHOULDER REPLACEMENT Bilateral     Social History   Tobacco Use  . Smoking status: Never Smoker  . Smokeless tobacco: Never Used  . Tobacco comment: smoking cessation materials not required  Substance Use Topics  . Alcohol use: Not Currently  . Drug use: No     Medication list has been reviewed and updated.  PHQ 2/9 Scores 07/07/2017 03/25/2017 11/19/2016 07/01/2016  PHQ - 2 Score 0 0 0 0  PHQ- 9 Score 0 0 0 -    Physical Exam  Constitutional: She appears well-developed and well-nourished. She has a sickly appearance.  HENT:  Right Ear: Tympanic membrane and ear canal normal.  Left Ear: Tympanic membrane and ear canal normal.  Nose: Right sinus exhibits no maxillary sinus tenderness and no frontal sinus tenderness. Left sinus exhibits no maxillary sinus tenderness and no frontal sinus tenderness.  Mouth/Throat: Posterior oropharyngeal erythema and tonsillar abscesses (left tonsil stone) present.  Cardiovascular: Normal rate, regular rhythm and normal heart sounds.  Pulmonary/Chest: Breath sounds normal. She has no wheezes. She has no rhonchi.    BP (!) 146/80   Pulse 70   Temp 99 F (37.2 C) (Oral)   Resp 16   Ht 5\' 3"  (1.6 m)   Wt 218 lb (98.9 kg)   SpO2 95%   BMI 38.62 kg/m   Assessment and Plan: 1. Pharyngitis due to other organism Warm  liquids Gargle to dislodge tonsil stone  2. Essential (primary) hypertension Fair control   Meds ordered this encounter  Medications  . amoxicillin (AMOXIL) 875 MG tablet    Sig: Take 1 tablet (875 mg total) by mouth 2 (two) times daily for 10 days.    Dispense:  20 tablet    Refill:  0  . guaiFENesin-codeine (ROBITUSSIN AC) 100-10 MG/5ML syrup    Sig: Take 5 mLs by mouth 3 (three) times daily as needed for cough.    Dispense:  118 mL    Refill:  0    Partially dictated using Editor, commissioning. Any errors are unintentional.  Halina Maidens, MD [facility]  07/07/2017

## 2017-07-07 NOTE — Patient Instructions (Signed)
Ms. Licklider , Thank you for taking time to come for your Medicare Wellness Visit. I appreciate your ongoing commitment to your health goals. Please review the following plan we discussed and let me know if I can assist you in the future.   Screening recommendations/referrals: Colorectal Screening: Completed 07/02/13. Repeat every 10 years Mammogram: No longer required Bone Density: Completed 03/20/04. Osteoporotic screening no longer required  Vision and Dental Exams: Recommended annual ophthalmology exams for early detection of glaucoma and other disorders of the eye Recommended annual dental exams for proper oral hygiene  Diabetic Exams: Recommended annual diabetic eye exams for early detection of retinopathy Recommended annual diabetic foot exams for early detection of peripheral neuropathy.  Diabetic Eye Exam: Completed 02/14/17  Diabetic Foot Exam: Completed 03/25/17  Vaccinations: Influenza vaccine: Up to date Pneumococcal vaccine: Contraindicated due to adverse reaction Tdap vaccine: Up to date Shingles vaccine: Please call your insurance company to determine your out of pocket expense for the Shingrix vaccine. You may also receive this vaccine at your local pharmacy or Health Dept.  Advanced directives: Advance directive discussed with you today. I have provided a copy for you to complete at home and have notarized. Once this is complete please bring a copy in to our office so we can scan it into your chart.  Conditions/risks identified: Recommend to drink at least 6-8 8oz glasses of water per day.  Next appointment: Please schedule your Annual Wellness Visit with your Nurse Health Advisor in one year.  Preventive Care 42 Years and Older, Female Preventive care refers to lifestyle choices and visits with your health care provider that can promote health and wellness. What does preventive care include?  A yearly physical exam. This is also called an annual well check.  Dental  exams once or twice a year.  Routine eye exams. Ask your health care provider how often you should have your eyes checked.  Personal lifestyle choices, including:  Daily care of your teeth and gums.  Regular physical activity.  Eating a healthy diet.  Avoiding tobacco and drug use.  Limiting alcohol use.  Practicing safe sex.  Taking low-dose aspirin every day.  Taking vitamin and mineral supplements as recommended by your health care provider. What happens during an annual well check? The services and screenings done by your health care provider during your annual well check will depend on your age, overall health, lifestyle risk factors, and family history of disease. Counseling  Your health care provider may ask you questions about your:  Alcohol use.  Tobacco use.  Drug use.  Emotional well-being.  Home and relationship well-being.  Sexual activity.  Eating habits.  History of falls.  Memory and ability to understand (cognition).  Work and work Statistician.  Reproductive health. Screening  You may have the following tests or measurements:  Height, weight, and BMI.  Blood pressure.  Lipid and cholesterol levels. These may be checked every 5 years, or more frequently if you are over 67 years old.  Skin check.  Lung cancer screening. You may have this screening every year starting at age 48 if you have a 30-pack-year history of smoking and currently smoke or have quit within the past 15 years.  Fecal occult blood test (FOBT) of the stool. You may have this test every year starting at age 55.  Flexible sigmoidoscopy or colonoscopy. You may have a sigmoidoscopy every 5 years or a colonoscopy every 10 years starting at age 81.  Hepatitis C blood test.  Hepatitis B blood test.  Sexually transmitted disease (STD) testing.  Diabetes screening. This is done by checking your blood sugar (glucose) after you have not eaten for a while (fasting). You may  have this done every 1-3 years.  Bone density scan. This is done to screen for osteoporosis. You may have this done starting at age 63.  Mammogram. This may be done every 1-2 years. Talk to your health care provider about how often you should have regular mammograms. Talk with your health care provider about your test results, treatment options, and if necessary, the need for more tests. Vaccines  Your health care provider may recommend certain vaccines, such as:  Influenza vaccine. This is recommended every year.  Tetanus, diphtheria, and acellular pertussis (Tdap, Td) vaccine. You may need a Td booster every 10 years.  Zoster vaccine. You may need this after age 11.  Pneumococcal 13-valent conjugate (PCV13) vaccine. One dose is recommended after age 32.  Pneumococcal polysaccharide (PPSV23) vaccine. One dose is recommended after age 55. Talk to your health care provider about which screenings and vaccines you need and how often you need them. This information is not intended to replace advice given to you by your health care provider. Make sure you discuss any questions you have with your health care provider. Document Released: 03/03/2015 Document Revised: 10/25/2015 Document Reviewed: 12/06/2014 Elsevier Interactive Patient Education  2017 [place] Prevention in the Home Falls can cause injuries. They can happen to people of all ages. There are many things you can do to make your home safe and to help prevent falls. What can I do on the outside of my home?  Regularly fix the edges of walkways and driveways and fix any cracks.  Remove anything that might make you trip as you walk through a door, such as a raised step or threshold.  Trim any bushes or trees on the path to your home.  Use bright outdoor lighting.  Clear any walking paths of anything that might make someone trip, such as rocks or tools.  Regularly check to see if handrails are loose or broken. Make  sure that both sides of any steps have handrails.  Any raised decks and porches should have guardrails on the edges.  Have any leaves, snow, or ice cleared regularly.  Use sand or salt on walking paths during winter.  Clean up any spills in your garage right away. This includes oil or grease spills. What can I do in the bathroom?  Use night lights.  Install grab bars by the toilet and in the tub and shower. Do not use towel bars as grab bars.  Use non-skid mats or decals in the tub or shower.  If you need to sit down in the shower, use a plastic, non-slip stool.  Keep the floor dry. Clean up any water that spills on the floor as soon as it happens.  Remove soap buildup in the tub or shower regularly.  Attach bath mats securely with double-sided non-slip rug tape.  Do not have throw rugs and other things on the floor that can make you trip. What can I do in the bedroom?  Use night lights.  Make sure that you have a light by your bed that is easy to reach.  Do not use any sheets or blankets that are too big for your bed. They should not hang down onto the floor.  Have a firm chair that has side arms. You can use this  for support while you get dressed.  Do not have throw rugs and other things on the floor that can make you trip. What can I do in the kitchen?  Clean up any spills right away.  Avoid walking on wet floors.  Keep items that you use a lot in easy-to-reach places.  If you need to reach something above you, use a strong step stool that has a grab bar.  Keep electrical cords out of the way.  Do not use floor polish or wax that makes floors slippery. If you must use wax, use non-skid floor wax.  Do not have throw rugs and other things on the floor that can make you trip. What can I do with my stairs?  Do not leave any items on the stairs.  Make sure that there are handrails on both sides of the stairs and use them. Fix handrails that are broken or loose.  Make sure that handrails are as long as the stairways.  Check any carpeting to make sure that it is firmly attached to the stairs. Fix any carpet that is loose or worn.  Avoid having throw rugs at the top or bottom of the stairs. If you do have throw rugs, attach them to the floor with carpet tape.  Make sure that you have a light switch at the top of the stairs and the bottom of the stairs. If you do not have them, ask someone to add them for you. What else can I do to help prevent falls?  Wear shoes that:  Do not have high heels.  Have rubber bottoms.  Are comfortable and fit you well.  Are closed at the toe. Do not wear sandals.  If you use a stepladder:  Make sure that it is fully opened. Do not climb a closed stepladder.  Make sure that both sides of the stepladder are locked into place.  Ask someone to hold it for you, if possible.  Clearly mark and make sure that you can see:  Any grab bars or handrails.  First and last steps.  Where the edge of each step is.  Use tools that help you move around (mobility aids) if they are needed. These include:  Canes.  Walkers.  Scooters.  Crutches.  Turn on the lights when you go into a dark area. Replace any light bulbs as soon as they burn out.  Set up your furniture so you have a clear path. Avoid moving your furniture around.  If any of your floors are uneven, fix them.  If there are any pets around you, be aware of where they are.  Review your medicines with your doctor. Some medicines can make you feel dizzy. This can increase your chance of falling. Ask your doctor what other things that you can do to help prevent falls. This information is not intended to replace advice given to you by your health care provider. Make sure you discuss any questions you have with your health care provider. Document Released: 12/01/2008 Document Revised: 07/13/2015 Document Reviewed: 03/11/2014 Elsevier Interactive Patient  Education  2017 [employer].

## 2017-07-07 NOTE — Progress Notes (Signed)
Subjective:   Kimberly Walls is a 77 y.o. female who presents for Medicare Annual (Subsequent) preventive examination.  Review of Systems:  N/A Cardiac Risk Factors include: diabetes mellitus;advanced age (>12men, >58 women);obesity (BMI >30kg/m2);dyslipidemia;hypertension     Objective:     Vitals: BP (!) 168/80 (BP Location: Right Arm, Patient Position: Sitting, Cuff Size: Large)   Pulse 70   Temp 99 F (37.2 C) (Oral)   Resp 14   Ht 5\' 3"  (1.6 m)   Wt 218 lb (98.9 kg)   SpO2 95%   BMI 38.62 kg/m   Body mass index is 38.62 kg/m.  Pt states she has taken antihypertensives this morning. Denies any chest pain or shortness of breath. Does have low grade fever, cough and runny nose for past several days. States it is not getting worse but syptoms have not improved either. Denies taking any OTC cold remedies to alleviate symptoms. Discussed above B/P results with Dr. Army Melia. Agreed to see pt today for further evaluation.   Advanced Directives 07/07/2017 06/29/2015 03/06/2015  Does Patient Have a Medical Advance Directive? No No No  Would patient like information on creating a medical advance directive? Yes (MAU/Ambulatory/Procedural Areas - Information given) No - patient declined information No - patient declined information    Tobacco Social History   Tobacco Use  Smoking Status Never Smoker  Smokeless Tobacco Never Used  Tobacco Comment   smoking cessation materials not required     Counseling given: No Comment: smoking cessation materials not required  Clinical Intake:  Pre-visit preparation completed: Yes  Pain : No/denies pain   BMI - recorded: 38.62 Nutritional Status: BMI > 30  Obese Nutrition Risk Assessment: Has the patient had any N/V/D within the last 2 months?  No Does the patient have any non-healing wounds?  No Has the patient had any unintentional weight loss or weight gain?  No  Is the patient diabetic?  Yes If diabetic, was a CBG obtained today?   No Did the patient bring in their glucometer from home?  No Comments: Pt monitors CBG's daily. Denies any financial strains with the device or supplies.  Diabetic Exams: Diabetic Eye Exam:  Completed 02/14/17. Diabetic Foot Exam: Completed 03/25/17.  How often do you need to have someone help you when you read instructions, pamphlets, or other written materials from your doctor or pharmacy?: 1 - Never  Interpreter Needed?: No  Information entered by :: AEversole, LPN  History reviewed. No pertinent past medical history. Past Surgical History:  Procedure Laterality Date  . BUNIONECTOMY Bilateral   . CARPAL TUNNEL RELEASE Left   . LUMBAR LAMINECTOMY    . MASTECTOMY MODIFIED RADICAL Bilateral 1993  . PARTIAL HYSTERECTOMY    . TOTAL KNEE ARTHROPLASTY Bilateral   . TOTAL SHOULDER REPLACEMENT Bilateral    Family History  Problem Relation Age of Onset  . Diabetes Mother   . Hypertension Mother   . CAD Father   . Diabetes Father   . Diabetes Sister   . Hypertension Sister   . Heart disease Brother   . Healthy Sister    Social History   Socioeconomic History  . Marital status: Divorced    Spouse name: Not on file  . Number of children: 1  . Years of education: some college  . Highest education level: 12th grade  Occupational History  . Occupation: Retired  Scientific laboratory technician  . Financial resource strain: Not hard at all  . Food insecurity:    Worry:  Never true    Inability: Never true  . Transportation needs:    Medical: No    Non-medical: No  Tobacco Use  . Smoking status: Never Smoker  . Smokeless tobacco: Never Used  . Tobacco comment: smoking cessation materials not required  Substance and Sexual Activity  . Alcohol use: Not Currently  . Drug use: No  . Sexual activity: Not Currently  Lifestyle  . Physical activity:    Days per week: 2 days    Minutes per session: 40 min  . Stress: Not at all  Relationships  . Social connections:    Talks on phone: Patient  refused    Gets together: Patient refused    Attends religious service: Patient refused    Active member of club or organization: Patient refused    Attends meetings of clubs or organizations: Patient refused    Relationship status: Divorced  Other Topics Concern  . Not on file  Social History Narrative  . Not on file    Outpatient Encounter Medications as of 07/07/2017  Medication Sig  . amLODipine (NORVASC) 2.5 MG tablet TAKE ONE TABLET BY MOUTH ONE TIME DAILY   . amoxicillin (AMOXIL) 500 MG tablet Take 500 mg by mouth as needed (prior to dental appt).  Marland Kitchen ascorbic acid (VITAMIN C) 250 MG tablet Take 250 mg by mouth daily.  Marland Kitchen aspirin 81 MG tablet Take 1 tablet by mouth daily.  Marland Kitchen atorvastatin (LIPITOR) 10 MG tablet TAKE ONE TABLET BY MOUTH ONE TIME DAILY   . BIOTIN 5000 PO Take 1 tablet by mouth daily.   . Calcium Carb-Cholecalciferol (CALCIUM + D3) 600-200 MG-UNIT TABS Take 1 tablet by mouth daily.  . Cinnamon 500 MG TABS Take 2 tablets by mouth daily.  . Coenzyme Q10 (COQ10) 200 MG CAPS Take 1 capsule by mouth daily.  . cyanocobalamin 1000 MCG tablet Take 1,000 mcg by mouth daily.  Marland Kitchen desloratadine (CLARINEX) 5 MG tablet TAKE 1 TABLET BY MOUTH EVERY DAY  . escitalopram (LEXAPRO) 10 MG tablet Take 1 tablet (10 mg total) by mouth daily.  . hydrochlorothiazide (HYDRODIURIL) 25 MG tablet TAKE 1 TABLET BY MOUTH ONCE DAILY (Patient taking differently: TAKE 0.5 TABLET BY MOUTH ONCE DAILY)  . lisinopril (PRINIVIL,ZESTRIL) 20 MG tablet TAKE 1 TABLET BY MOUTH EVERY DAY  . Magnesium 400 MG CAPS Take 1 tablet by mouth daily.   . metFORMIN (GLUCOPHAGE-XR) 500 MG 24 hr tablet TAKE 1 TABLET BY MOUTH ONCE A DAY WITH BREAKFAST  . nystatin cream (MYCOSTATIN) Apply 1 application topically 2 (two) times daily.  . Omega-3 Fatty Acids (FISH OIL) 1200 MG CPDR Take by mouth.  Marland Kitchen omeprazole (PRILOSEC) 20 MG capsule TAKE 1 CAPSULE BY MOUTH EVERY DAY  . oxaprozin (DAYPRO) 600 MG tablet TAKE 1 TABLET BY MOUTH  TWICE A DAY  . [DISCONTINUED] pyridoxine (B-6) 100 MG tablet Take 100 mg by mouth daily.   No facility-administered encounter medications on file as of 07/07/2017.     Activities of Daily Living In your present state of health, do you have any difficulty performing the following activities: 07/07/2017  Hearing? N  Comment denies hearing aids  Vision? N  Comment wears eyeglasses  Difficulty concentrating or making decisions? N  Walking or climbing stairs? Y  Comment dyspnea  Dressing or bathing? N  Doing errands, shopping? N  Preparing Food and eating ? N  Comment denies dentures  Using the Toilet? N  In the past six months, have you accidently leaked urine?  N  Do you have problems with loss of bowel control? N  Managing your Medications? N  Managing your Finances? N  Housekeeping or managing your Housekeeping? N  Some recent data might be hidden    Patient Care Team: Glean Hess, MD as PCP - General (Family Medicine) San Jetty, MD as Referring Physician (Gastroenterology) Magda Kiel Erlene Senters, MD as Referring Physician (Dermatology) Arelia Sneddon, MD as Consulting Physician (Otolaryngology)    Assessment:   This is a routine wellness examination for Timmie.  Exercise Activities and Dietary recommendations Current Exercise Habits: Structured exercise class, Type of exercise: strength training/weights, Time (Minutes): 40, Frequency (Times/Week): 2, Weekly Exercise (Minutes/Week): 80, Intensity: Mild, Exercise limited by: None identified  Goals    . DIET - INCREASE WATER INTAKE     Recommend to drink at least 6-8 8oz glasses of water per day.       Fall Risk Fall Risk  07/07/2017 07/01/2016 06/29/2015 12/05/2014  Falls in the past year? No No No No   FALL RISK PREVENTION PERTAINING TO HOME: Is your home free of loose throw rugs in walkways, pet beds, electrical cords, etc? Yes Is there adequate lighting in your home to reduce risk of falls?  Yes Are there stairs  in or around your home WITH handrails? Yes  ASSISTIVE DEVICES UTILIZED TO PREVENT FALLS: Use of a cane, walker or w/c? No Grab bars in the bathroom? No  Shower chair or a place to sit while bathing? Yes An elevated toilet seat or a handicapped toilet? No  Timed Get Up and Go Performed: Yes. Pt ambulated 10 feet within 11 sec. Gait stead-fast and without the use of an assistive device. No intervention required at this time. Fall risk prevention has been discussed.  Community Resource Referral:  Pt declined my offer to send [employer] Referral to Care Guide for installation of grab bars in the shower or an elevated toilet seat.  Depression Screen PHQ 2/9 Scores 07/07/2017 03/25/2017 11/19/2016 07/01/2016  PHQ - 2 Score 0 0 0 0  PHQ- 9 Score 0 0 0 -     Cognitive Function     6CIT Screen 07/07/2017 07/01/2016  What Year? 0 points 0 points  What month? 0 points 0 points  What time? 0 points 0 points  Count back from 20 0 points 0 points  Months in reverse 0 points 0 points  Repeat phrase 0 points 0 points  Total Score 0 0    Immunization History  Administered Date(s) Administered  . Influenza, High Dose Seasonal PF 11/19/2016  . Influenza,inj,Quad PF,6+ Mos 01/01/2016  . Influenza-Unspecified 11/25/2014  . Pneumococcal Conjugate-13 06/27/2014  . Tdap 07/01/2016    Qualifies for Shingles Vaccine? Yes. Due for Shingrix. Education has been provided regarding the importance of this vaccine. Pt has been advised to call her insurance company to determine her out of pocket expense. Advised she may also receive this vaccine at her local pharmacy or Health Dept. Verbalized acceptance and understanding.  Screening Tests Health Maintenance  Topic Date Due  . INFLUENZA VACCINE  09/18/2017  . HEMOGLOBIN A1C  09/22/2017  . OPHTHALMOLOGY EXAM  02/14/2018  . FOOT EXAM  03/25/2018  . COLONOSCOPY  06/19/2023  . TETANUS/TDAP  07/02/2026  . DEXA SCAN  Completed  . PNA vac Low Risk  Adult  Discontinued    Cancer Screenings: Lung: Low Dose CT Chest recommended if Age 66-80 years, 30 pack-year currently smoking OR have quit w/in 15years. Patient does  not qualify. Breast Screening: No longer required Up to date of Bone Density/Dexa? Yes. Completed 03/20/04. Osteoporotic screening no longer required Colorectal: Completed 07/02/13. Repeat every 10 years  Additional Screenings: Hepatitis C Screening: Does not qualify    Plan:  I have personally reviewed and addressed the Medicare Annual Wellness questionnaire and have noted the following in the patient's chart:  A. Medical and social history B. Use of alcohol, tobacco or illicit drugs  C. Current medications and supplements D. Functional ability and status E.  Nutritional status F.  Physical activity G. Advance directives H. List of other physicians I.  Hospitalizations, surgeries, and ER visits in previous 12 months J.  [place] such as hearing and vision if needed, cognitive and depression L. Referrals and appointments  In addition, I have reviewed and discussed with patient certain preventive protocols, quality metrics, and best practice recommendations. A written personalized care plan for preventive services as well as general preventive health recommendations were provided to patient.  Signed,  Aleatha Borer, LPN Nurse Health Advisor  MD Recommendations: Due for Shingrix. Education has been provided regarding the importance of this vaccine. Pt has been advised to call her insurance company to determine her out of pocket expense. Advised she may also receive this vaccine at her local pharmacy or Health Dept. Verbalized acceptance and understanding.

## 2017-07-10 DIAGNOSIS — L905 Scar conditions and fibrosis of skin: Secondary | ICD-10-CM | POA: Diagnosis not present

## 2017-07-10 DIAGNOSIS — R0789 Other chest pain: Secondary | ICD-10-CM | POA: Diagnosis not present

## 2017-07-10 DIAGNOSIS — Z853 Personal history of malignant neoplasm of breast: Secondary | ICD-10-CM | POA: Diagnosis not present

## 2017-07-17 ENCOUNTER — Ambulatory Visit
Admission: RE | Admit: 2017-07-17 | Discharge: 2017-07-17 | Disposition: A | Discharge status: Home / Self Care | Payer: Medicare Other | Source: Ambulatory Visit | Attending: Internal Medicine | Admitting: Internal Medicine

## 2017-07-17 ENCOUNTER — Ambulatory Visit (INDEPENDENT_AMBULATORY_CARE_PROVIDER_SITE_OTHER): Payer: Medicare Other | Admitting: Internal Medicine

## 2017-07-17 ENCOUNTER — Encounter: Payer: Self-pay | Admitting: Internal Medicine

## 2017-07-17 VITALS — BP 138/82 | HR 72 | Temp 98.1°F | Resp 16 | Ht 62.75 in | Wt 216.0 lb

## 2017-07-17 DIAGNOSIS — Z853 Personal history of malignant neoplasm of breast: Secondary | ICD-10-CM | POA: Diagnosis not present

## 2017-07-17 DIAGNOSIS — F3341 Major depressive disorder, recurrent, in partial remission: Secondary | ICD-10-CM

## 2017-07-17 DIAGNOSIS — I10 Essential (primary) hypertension: Secondary | ICD-10-CM | POA: Diagnosis not present

## 2017-07-17 DIAGNOSIS — K219 Gastro-esophageal reflux disease without esophagitis: Secondary | ICD-10-CM | POA: Diagnosis not present

## 2017-07-17 DIAGNOSIS — E785 Hyperlipidemia, unspecified: Secondary | ICD-10-CM | POA: Diagnosis not present

## 2017-07-17 DIAGNOSIS — M19042 Primary osteoarthritis, left hand: Secondary | ICD-10-CM

## 2017-07-17 DIAGNOSIS — E119 Type 2 diabetes mellitus without complications: Secondary | ICD-10-CM | POA: Diagnosis not present

## 2017-07-17 DIAGNOSIS — M19041 Primary osteoarthritis, right hand: Secondary | ICD-10-CM | POA: Diagnosis not present

## 2017-07-17 DIAGNOSIS — R0602 Shortness of breath: Secondary | ICD-10-CM | POA: Diagnosis not present

## 2017-07-17 DIAGNOSIS — R079 Chest pain, unspecified: Secondary | ICD-10-CM | POA: Diagnosis not present

## 2017-07-17 LAB — POCT URINALYSIS DIPSTICK
Bilirubin, UA: NEGATIVE
Blood, UA: NEGATIVE
Glucose, UA: NEGATIVE
Ketones, UA: NEGATIVE
Leukocytes, UA: NEGATIVE
Nitrite, UA: NEGATIVE
Protein, UA: NEGATIVE
Spec Grav, UA: <=1.005 — AB (ref 1.010–1.025)
Urobilinogen, UA: 0.2 E.U./dL
pH, UA: 5 (ref 5.0–8.0)

## 2017-07-17 MED ORDER — DICLOFENAC SODIUM 3 % EX CREA: 1.0000 "application " | TOPICAL_CREAM | Freq: Four times a day (QID) | CUTANEOUS | 0 refills | Status: DC | PRN

## 2017-07-17 NOTE — Progress Notes (Signed)
Date:  07/17/2017   Name:  Kimberly Walls   DOB:  01-04-1941   MRN:  [id number]   Chief Complaint: No chief complaint on file.  Diabetes  She presents for her follow-up diabetic visit. She has type 2 diabetes mellitus. Her disease course has been stable. Pertinent negatives for hypoglycemia include no dizziness, headaches, nervousness/anxiousness or tremors. Pertinent negatives for diabetes include no chest pain, no fatigue, no polydipsia and no polyuria. There are no hypoglycemic complications. Symptoms are stable. There are no diabetic complications. Current diabetic treatment includes diet. She is compliant with treatment most of the time. An ACE inhibitor/angiotensin II receptor blocker is being taken. Eye exam is current.  Hypertension  This is a chronic problem. The problem is controlled. Pertinent negatives include no chest pain, headaches, palpitations or shortness of breath. Past treatments include ACE inhibitors. The current treatment provides significant improvement.  Hyperlipidemia  This is a chronic problem. The problem is controlled. Pertinent negatives include no chest pain or shortness of breath. Current antihyperlipidemic treatment includes statins. The current treatment provides significant improvement of lipids.  Gastroesophageal Reflux  She complains of coughing and heartburn. She reports no abdominal pain, no chest pain or no wheezing. This is a recurrent problem. The problem occurs rarely. Pertinent negatives include no fatigue.  Depression         The problem occurs rarely.  The problem has been resolved since onset.  Associated symptoms include no fatigue and no headaches.  Past treatments include SSRIs - Selective serotonin reuptake inhibitors.  Previous treatment provided significant relief. Breast cancer hx - s/p bilateral mastectomies; does not get mammograms.  Has been getting CXR annually but does not need to continue these. Cough - improved from recent URI but still  bothersome.  No SOB, fever, chills. Arthralgias - esp in thumbs and wrists.  Has used voltaren gel in the past.  Lab Results  Component Value Date   HGBA1C 7.0 (H) 03/25/2017    Review of Systems  Constitutional: Negative for chills, fatigue and fever.  HENT: Negative for congestion, hearing loss, tinnitus, trouble swallowing and voice change.   Eyes: Negative for visual disturbance.  Respiratory: Positive for cough. Negative for chest tightness, shortness of breath and wheezing.   Cardiovascular: Negative for chest pain, palpitations and leg swelling.  Gastrointestinal: Positive for heartburn. Negative for abdominal pain, constipation, diarrhea and vomiting.  Endocrine: Negative for polydipsia and polyuria.  Genitourinary: Negative for dysuria, frequency, genital sores, vaginal bleeding and vaginal discharge.  Musculoskeletal: Positive for arthralgias. Negative for gait problem and joint swelling.  Skin: Negative for color change and rash.  Neurological: Negative for dizziness, tremors, light-headedness and headaches.  Hematological: Negative for adenopathy. Does not bruise/bleed easily.  Psychiatric/Behavioral: Positive for depression. Negative for dysphoric mood and sleep disturbance. The patient is not nervous/anxious.     Patient Active Problem List   Diagnosis Date Noted  . Adiposity 06/29/2015  . Pulmonary embolism ([place]) 06/29/2015  . Type 2 diabetes mellitus without complication, without long-term current use of insulin ([place]) 03/06/2015  . Arthritis, degenerative 12/05/2014  . Breathlessness on exertion 10/06/2014  . DDD (degenerative disc disease), lumbar 08/29/2014  . Dyslipidemia 08/29/2014  . Essential (primary) hypertension 08/29/2014  . Acid reflux 08/29/2014  . Herpes 08/29/2014  . Obstructive sleep apnea of adult 08/29/2014  . Depression, major, recurrent, in partial remission ([place]) 08/29/2014  . Headache, tension-type 08/29/2014  . Dermatophytosis of groin  08/29/2014  . History of colon polyps 02/19/2008  .  Personal history of malignant neoplasm of breast 02/19/1991    Prior to Admission medications   Medication Sig Start Date End Date Taking? Authorizing Provider  amLODipine (NORVASC) 2.5 MG tablet TAKE ONE TABLET BY MOUTH ONE TIME DAILY  01/20/17  Yes Glean Hess, MD  ascorbic acid (VITAMIN C) 250 MG tablet Take 250 mg by mouth daily.   Yes [provider]  aspirin 81 MG tablet Take 1 tablet by mouth daily.   Yes [provider]  atorvastatin (LIPITOR) 10 MG tablet TAKE ONE TABLET BY MOUTH ONE TIME DAILY  06/29/16  Yes Glean Hess, MD  BIOTIN 5000 PO Take 1 tablet by mouth daily.    Yes [provider]  Calcium Carb-Cholecalciferol (CALCIUM + D3) 600-200 MG-UNIT TABS Take 1 tablet by mouth daily.   Yes [provider]  Cinnamon 500 MG TABS Take 2 tablets by mouth daily.   Yes [provider]  Coenzyme Q10 (COQ10) 200 MG CAPS Take 1 capsule by mouth daily.   Yes [provider]  cyanocobalamin 1000 MCG tablet Take 1,000 mcg by mouth daily.   Yes [provider]  desloratadine (CLARINEX) 5 MG tablet TAKE 1 TABLET BY MOUTH EVERY DAY 07/29/16  Yes Glean Hess, MD  escitalopram (LEXAPRO) 10 MG tablet Take 1 tablet (10 mg total) by mouth daily. 03/25/17  Yes Glean Hess, MD  hydrochlorothiazide (HYDRODIURIL) 25 MG tablet TAKE 1 TABLET BY MOUTH ONCE DAILY Patient taking differently: TAKE 0.5 TABLET BY MOUTH ONCE DAILY 02/21/16  Yes Glean Hess, MD  lisinopril (PRINIVIL,ZESTRIL) 20 MG tablet TAKE 1 TABLET BY MOUTH EVERY DAY 06/29/16  Yes Glean Hess, MD  Magnesium 400 MG CAPS Take 1 tablet by mouth daily.    Yes [provider]  Multiple Vitamin (MULTIVITAMIN) capsule Take 1 capsule by mouth daily.   Yes [provider]  Omega-3 Fatty Acids (FISH OIL) 1200 MG CPDR Take by mouth.   Yes [provider]  omeprazole (PRILOSEC) 20 MG  capsule TAKE 1 CAPSULE BY MOUTH EVERY DAY 07/29/16  Yes Glean Hess, MD  oxaprozin (DAYPRO) 600 MG tablet TAKE 1 TABLET BY MOUTH TWICE A DAY 09/23/16  Yes Glean Hess, MD  pyridoxine (B-6) 100 MG tablet Take 100 mg by mouth daily.   Yes [provider]  triamcinolone cream (KENALOG) 0.1 % Apply 1 application topically 2 (two) times daily.   Yes [provider]  amoxicillin (AMOXIL) 500 MG tablet Take 500 mg by mouth as needed (prior to dental appt).    [provider]  guaiFENesin-codeine (ROBITUSSIN AC) 100-10 MG/5ML syrup Take 5 mLs by mouth 3 (three) times daily as needed for cough. 07/07/17   Glean Hess, MD  metFORMIN (GLUCOPHAGE-XR) 500 MG 24 hr tablet TAKE 1 TABLET BY MOUTH ONCE A DAY WITH BREAKFAST 06/19/17   Glean Hess, MD  nystatin cream (MYCOSTATIN) Apply 1 application topically 2 (two) times daily.    [provider]    Allergies  Allergen Reactions  . Prochlorperazine   . Pneumococcal Vaccine Rash  . Tetanus Toxoids Rash    Past Surgical History:  Procedure Laterality Date  . BUNIONECTOMY Bilateral   . CARPAL TUNNEL RELEASE Left   . LUMBAR LAMINECTOMY    . MASTECTOMY MODIFIED RADICAL Bilateral 1993  . PARTIAL HYSTERECTOMY    . TOTAL KNEE ARTHROPLASTY Bilateral   . TOTAL SHOULDER REPLACEMENT Bilateral     Social History   Tobacco  Use  . Smoking status: Never Smoker  . Smokeless tobacco: Never Used  . Tobacco comment: smoking cessation materials not required  Substance Use Topics  . Alcohol use: Not Currently  . Drug use: No     Medication list has been reviewed and updated.  Current Meds  Medication Sig  . amLODipine (NORVASC) 2.5 MG tablet TAKE ONE TABLET BY MOUTH ONE TIME DAILY   . ascorbic acid (VITAMIN C) 250 MG tablet Take 250 mg by mouth daily.  Marland Kitchen aspirin 81 MG tablet Take 1 tablet by mouth daily.  Marland Kitchen atorvastatin (LIPITOR) 10 MG tablet TAKE ONE TABLET BY MOUTH ONE TIME DAILY   . BIOTIN 5000 PO  Take 1 tablet by mouth daily.   . Calcium Carb-Cholecalciferol (CALCIUM + D3) 600-200 MG-UNIT TABS Take 1 tablet by mouth daily.  . Cinnamon 500 MG TABS Take 2 tablets by mouth daily.  . Coenzyme Q10 (COQ10) 200 MG CAPS Take 1 capsule by mouth daily.  . cyanocobalamin 1000 MCG tablet Take 1,000 mcg by mouth daily.  Marland Kitchen desloratadine (CLARINEX) 5 MG tablet TAKE 1 TABLET BY MOUTH EVERY DAY  . escitalopram (LEXAPRO) 10 MG tablet Take 1 tablet (10 mg total) by mouth daily.  . hydrochlorothiazide (HYDRODIURIL) 25 MG tablet TAKE 1 TABLET BY MOUTH ONCE DAILY (Patient taking differently: TAKE 0.5 TABLET BY MOUTH ONCE DAILY)  . lisinopril (PRINIVIL,ZESTRIL) 20 MG tablet TAKE 1 TABLET BY MOUTH EVERY DAY  . Magnesium 400 MG CAPS Take 1 tablet by mouth daily.   . Multiple Vitamin (MULTIVITAMIN) capsule Take 1 capsule by mouth daily.  . Omega-3 Fatty Acids (FISH OIL) 1200 MG CPDR Take by mouth.  Marland Kitchen omeprazole (PRILOSEC) 20 MG capsule TAKE 1 CAPSULE BY MOUTH EVERY DAY  . oxaprozin (DAYPRO) 600 MG tablet TAKE 1 TABLET BY MOUTH TWICE A DAY  . pyridoxine (B-6) 100 MG tablet Take 100 mg by mouth daily.  Marland Kitchen triamcinolone cream (KENALOG) 0.1 % Apply 1 application topically 2 (two) times daily.    PHQ 2/9 Scores 07/07/2017 03/25/2017 11/19/2016 07/01/2016  PHQ - 2 Score 0 0 0 0  PHQ- 9 Score 0 0 0 -    Physical Exam  Constitutional: She is oriented to person, place, and time. She appears well-developed and well-nourished. No distress.  HENT:  Head: Normocephalic and atraumatic.  Right Ear: Tympanic membrane and ear canal normal.  Left Ear: Tympanic membrane and ear canal normal.  Nose: Right sinus exhibits no maxillary sinus tenderness. Left sinus exhibits no maxillary sinus tenderness.  Mouth/Throat: Uvula is midline and oropharynx is clear and moist.  Eyes: Conjunctivae and EOM are normal. Right eye exhibits no discharge. Left eye exhibits no discharge. No scleral icterus.  Neck: Normal range of motion. Carotid  bruit is not present. No erythema present. No thyromegaly present.  Cardiovascular: Normal rate, regular rhythm, normal heart sounds and normal pulses.  Pulmonary/Chest: Effort normal. No respiratory distress. She has no wheezes. Right breast exhibits skin change. Right breast exhibits no mass. Left breast exhibits skin change. Left breast exhibits no mass.  Bilateral mastectomy scars - healed, no mass or skin change  Abdominal: Soft. Bowel sounds are normal. There is no hepatosplenomegaly. There is no tenderness. There is no CVA tenderness.  Musculoskeletal: Normal range of motion.  Lymphadenopathy:    She has no cervical adenopathy.    She has no axillary adenopathy.  Neurological: She is alert and oriented to person, place, and time. She has normal reflexes. No cranial nerve deficit  or sensory deficit.  Skin: Skin is warm, dry and intact. No rash noted.  Psychiatric: She has a normal mood and affect. Her speech is normal and behavior is normal. Thought content normal. Cognition and memory are normal.  Nursing note and vitals reviewed.   BP 138/82   Pulse 72   Temp 98.1 F (36.7 C) (Oral)   Resp 16   Ht 5' 2.75" (1.594 m)   Wt 216 lb (98 kg)   SpO2 97%   BMI 38.57 kg/m   Assessment and Plan: 1. Primary osteoarthritis of both hands Will try topical voltaren - Diclofenac Sodium 3 % CREA; Apply 1 application topically 4 (four) times daily as needed.  Dispense: 2500 g; Refill: 0  2. Essential (primary) hypertension controlled - CBC with Differential/Platelet - POCT urinalysis dipstick  3. Gastroesophageal reflux disease, esophagitis presence not specified Stable on PPI - CBC with Differential/Platelet  4. Type 2 diabetes mellitus without complication, without long-term current use of insulin (HCC) Continue diet control No need for monitoring at this time - Comprehensive metabolic panel - Hemoglobin A1c - TSH - POCT urinalysis dipstick  5. Depression, major, recurrent, in  partial remission ([place]) controlled  6. Dyslipidemia On statin - Lipid panel  7. Personal history of malignant neoplasm of breast - DG Chest 2 View; Future   Meds ordered this encounter  Medications  . Diclofenac Sodium 3 % CREA    Sig: Apply 1 application topically 4 (four) times daily as needed.    Dispense:  2500 g    Refill:  0    Partially dictated using Editor, commissioning. Any errors are unintentional.  Halina Maidens, MD [facility]  07/17/2017   There are no diagnoses linked to this encounter.

## 2017-07-18 LAB — COMPREHENSIVE METABOLIC PANEL
ALT: 16 IU/L (ref 0–32)
AST: 28 IU/L (ref 0–40)
Albumin/Globulin Ratio: 1.8 (ref 1.2–2.2)
Albumin: 4.3 g/dL (ref 3.5–4.8)
Alkaline Phosphatase: 105 IU/L (ref 39–117)
BUN/Creatinine Ratio: 20 (ref 12–28)
BUN: 16 mg/dL (ref 8–27)
Bilirubin Total: 0.3 mg/dL (ref 0.0–1.2)
CO2: 23 mmol/L (ref 20–29)
Calcium: 9.4 mg/dL (ref 8.7–10.3)
Chloride: 102 mmol/L (ref 96–106)
Creatinine, Ser: 0.81 mg/dL (ref 0.57–1.00)
GFR calc Af Amer: 82 mL/min/{1.73_m2} (ref >59)
GFR calc non Af Amer: 71 mL/min/{1.73_m2} (ref >59)
Globulin, Total: 2.4 g/dL (ref 1.5–4.5)
Glucose: 142 mg/dL — ABNORMAL HIGH (ref 65–99)
Potassium: 4.7 mmol/L (ref 3.5–5.2)
Sodium: 141 mmol/L (ref 134–144)
Total Protein: 6.7 g/dL (ref 6.0–8.5)

## 2017-07-18 LAB — CBC WITH DIFFERENTIAL/PLATELET
Basophils Absolute: 0.1 10*3/uL (ref 0.0–0.2)
Basos: 1 %
EOS (ABSOLUTE): 0.3 10*3/uL (ref 0.0–0.4)
Eos: 4 %
Hematocrit: 42.8 % (ref 34.0–46.6)
Hemoglobin: 14.7 g/dL (ref 11.1–15.9)
Immature Grans (Abs): 0 10*3/uL (ref 0.0–0.1)
Immature Granulocytes: 0 %
Lymphocytes Absolute: 2.3 10*3/uL (ref 0.7–3.1)
Lymphs: 32 %
MCH: 28.9 pg (ref 26.6–33.0)
MCHC: 34.3 g/dL (ref 31.5–35.7)
MCV: 84 fL (ref 79–97)
Monocytes Absolute: 0.7 10*3/uL (ref 0.1–0.9)
Monocytes: 10 %
Neutrophils Absolute: 3.7 10*3/uL (ref 1.4–7.0)
Neutrophils: 53 %
Platelets: 238 10*3/uL (ref 150–450)
RBC: 5.08 x10E6/uL (ref 3.77–5.28)
RDW: 14.6 % (ref 12.3–15.4)
WBC: 7.2 10*3/uL (ref 3.4–10.8)

## 2017-07-18 LAB — LIPID PANEL
Chol/HDL Ratio: 3.7 ratio (ref 0.0–4.4)
Cholesterol, Total: 157 mg/dL (ref 100–199)
HDL: 42 mg/dL (ref >39)
LDL Calculated: 81 mg/dL (ref 0–99)
Triglycerides: 169 mg/dL — ABNORMAL HIGH (ref 0–149)
VLDL Cholesterol Cal: 34 mg/dL (ref 5–40)

## 2017-07-18 LAB — HEMOGLOBIN A1C
Est. average glucose Bld gHb Est-mCnc: 154 mg/dL
Hgb A1c MFr Bld: 7 % — ABNORMAL HIGH (ref 4.8–5.6)

## 2017-07-18 LAB — TSH: TSH: 2.49 u[IU]/mL (ref 0.450–4.500)

## 2017-07-21 ENCOUNTER — Other Ambulatory Visit: Payer: Self-pay | Admitting: Internal Medicine

## 2017-07-21 DIAGNOSIS — E785 Hyperlipidemia, unspecified: Secondary | ICD-10-CM

## 2017-07-31 DIAGNOSIS — R0789 Other chest pain: Secondary | ICD-10-CM | POA: Diagnosis not present

## 2017-07-31 DIAGNOSIS — Z853 Personal history of malignant neoplasm of breast: Secondary | ICD-10-CM | POA: Diagnosis not present

## 2017-07-31 DIAGNOSIS — L905 Scar conditions and fibrosis of skin: Secondary | ICD-10-CM | POA: Diagnosis not present

## 2017-08-20 ENCOUNTER — Other Ambulatory Visit: Payer: Self-pay | Admitting: Internal Medicine

## 2017-08-25 DIAGNOSIS — R0789 Other chest pain: Secondary | ICD-10-CM | POA: Diagnosis not present

## 2017-08-25 DIAGNOSIS — L905 Scar conditions and fibrosis of skin: Secondary | ICD-10-CM | POA: Diagnosis not present

## 2017-08-25 DIAGNOSIS — Z853 Personal history of malignant neoplasm of breast: Secondary | ICD-10-CM | POA: Diagnosis not present

## 2017-08-26 DIAGNOSIS — L905 Scar conditions and fibrosis of skin: Secondary | ICD-10-CM | POA: Diagnosis not present

## 2017-08-26 DIAGNOSIS — Z853 Personal history of malignant neoplasm of breast: Secondary | ICD-10-CM | POA: Diagnosis not present

## 2017-09-20 ENCOUNTER — Other Ambulatory Visit: Payer: Self-pay | Admitting: Internal Medicine

## 2017-11-16 ENCOUNTER — Encounter: Payer: Self-pay | Admitting: Internal Medicine

## 2017-11-16 ENCOUNTER — Other Ambulatory Visit: Payer: Self-pay | Admitting: Internal Medicine

## 2017-11-17 ENCOUNTER — Ambulatory Visit (INDEPENDENT_AMBULATORY_CARE_PROVIDER_SITE_OTHER): Payer: Medicare Other | Admitting: Internal Medicine

## 2017-11-17 ENCOUNTER — Encounter: Payer: Self-pay | Admitting: Internal Medicine

## 2017-11-17 VITALS — BP 134/74 | HR 64 | Ht 63.0 in | Wt 221.0 lb

## 2017-11-17 DIAGNOSIS — I10 Essential (primary) hypertension: Secondary | ICD-10-CM

## 2017-11-17 DIAGNOSIS — Z23 Encounter for immunization: Secondary | ICD-10-CM

## 2017-11-17 DIAGNOSIS — E118 Type 2 diabetes mellitus with unspecified complications: Secondary | ICD-10-CM | POA: Diagnosis not present

## 2017-11-17 DIAGNOSIS — F3341 Major depressive disorder, recurrent, in partial remission: Secondary | ICD-10-CM

## 2017-11-17 MED ORDER — AMLODIPINE BESYLATE 2.5 MG PO TABS: 2.5000 mg | ORAL_TABLET | Freq: Every day | ORAL | 3 refills | Status: DC

## 2017-11-17 MED ORDER — HYDROCHLOROTHIAZIDE 25 MG PO TABS: 25.0000 mg | ORAL_TABLET | Freq: Every day | ORAL | 3 refills | Status: DC

## 2017-11-17 NOTE — Progress Notes (Signed)
Date:  11/17/2017   Name:  Kimberly Walls   DOB:  [birth year]   MRN:  [id number]   Chief Complaint: Diabetes and Immunizations (High dose flu -)  Diabetes  She presents for her follow-up diabetic visit. She has type 2 diabetes mellitus. Her disease course has been stable. Pertinent negatives for hypoglycemia include no dizziness, headaches or tremors. Pertinent negatives for diabetes include no chest pain, no fatigue, no polydipsia and no polyuria. An ACE inhibitor/angiotensin II receptor blocker is being taken.  Hypertension  This is a chronic problem. The problem is controlled. Pertinent negatives include no chest pain, headaches, palpitations or shortness of breath. Past treatments include calcium channel blockers and ACE inhibitors. The current treatment provides significant improvement.  Depression         This is a chronic problem.  Associated symptoms include no fatigue, no appetite change and no headaches.  Past treatments include SSRIs - Selective serotonin reuptake inhibitors.  Compliance with treatment is good.   Review of Systems  Constitutional: Negative for appetite change, fatigue, fever and unexpected weight change.  HENT: Negative for tinnitus and trouble swallowing.   Eyes: Negative for visual disturbance.  Respiratory: Negative for cough, chest tightness and shortness of breath.   Cardiovascular: Negative for chest pain, palpitations and leg swelling.  Gastrointestinal: Negative for abdominal pain.  Endocrine: Negative for polydipsia and polyuria.  Genitourinary: Negative for dysuria and hematuria.  Musculoskeletal: Positive for arthralgias.  Neurological: Negative for dizziness, tremors, numbness and headaches.  Psychiatric/Behavioral: Positive for depression. Negative for dysphoric mood and sleep disturbance.    Patient Active Problem List   Diagnosis Date Noted  . BMI 38.0-38.9,adult 06/29/2015  . History of pulmonary embolus (PE) 06/29/2015  . DM type 2,  controlled, with complication ([place]) 87/56/4332  . Arthritis, degenerative 12/05/2014  . DDD (degenerative disc disease), lumbar 08/29/2014  . Hyperlipidemia associated with type 2 diabetes mellitus ([place]) 08/29/2014  . Essential (primary) hypertension 08/29/2014  . Acid reflux 08/29/2014  . Herpes 08/29/2014  . Obstructive sleep apnea of adult 08/29/2014  . Depression, major, recurrent, in partial remission ([place]) 08/29/2014  . Headache, tension-type 08/29/2014  . Dermatophytosis of groin 08/29/2014  . History of colon polyps 02/19/2008  . Personal history of malignant neoplasm of breast 02/19/1991    Allergies  Allergen Reactions  . Prochlorperazine   . Pneumococcal Vaccine Rash  . Tetanus Toxoids Rash    Past Surgical History:  Procedure Laterality Date  . BUNIONECTOMY Bilateral   . CARPAL TUNNEL RELEASE Left   . LUMBAR LAMINECTOMY    . MASTECTOMY MODIFIED RADICAL Bilateral 1993  . PARTIAL HYSTERECTOMY    . TOTAL KNEE ARTHROPLASTY Bilateral   . TOTAL SHOULDER REPLACEMENT Bilateral     Social History   Tobacco Use  . Smoking status: Never Smoker  . Smokeless tobacco: Never Used  . Tobacco comment: smoking cessation materials not required  Substance Use Topics  . Alcohol use: Not Currently  . Drug use: No     Medication list has been reviewed and updated.  Current Meds  Medication Sig  . amLODipine (NORVASC) 2.5 MG tablet TAKE ONE TABLET BY MOUTH ONE TIME DAILY   . ascorbic acid (VITAMIN C) 250 MG tablet Take 250 mg by mouth daily.  Marland Kitchen aspirin 81 MG tablet Take 1 tablet by mouth daily.  Marland Kitchen atorvastatin (LIPITOR) 10 MG tablet TAKE ONE TABLET BY MOUTH ONE TIME DAILY   . BIOTIN 5000 PO Take 1 tablet by mouth daily.   Marland Kitchen  Calcium Carb-Cholecalciferol (CALCIUM + D3) 600-200 MG-UNIT TABS Take 1 tablet by mouth daily.  . Cinnamon 500 MG TABS Take 2 tablets by mouth daily.  . Coenzyme Q10 (COQ10) 200 MG CAPS Take 1 capsule by mouth daily.  . cyanocobalamin 1000 MCG tablet  Take 1,000 mcg by mouth daily.  Marland Kitchen desloratadine (CLARINEX) 5 MG tablet TAKE ONE TABLET BY MOUTH ONE TIME DAILY   . escitalopram (LEXAPRO) 10 MG tablet Take 1 tablet (10 mg total) by mouth daily.  . hydrochlorothiazide (HYDRODIURIL) 25 MG tablet TAKE 1 TABLET BY MOUTH ONCE DAILY (Patient taking differently: Take 25 mg by mouth daily. )  . lisinopril (PRINIVIL,ZESTRIL) 20 MG tablet TAKE ONE TABLET BY MOUTH ONE TIME DAILY   . Magnesium 400 MG CAPS Take 1 tablet by mouth daily.   . metFORMIN (GLUCOPHAGE-XR) 500 MG 24 hr tablet TAKE 1 TABLET BY MOUTH ONCE A DAY WITH BREAKFAST  . Multiple Vitamin (MULTIVITAMIN) capsule Take 1 capsule by mouth daily.  Marland Kitchen omeprazole (PRILOSEC) 20 MG capsule TAKE ONE CAPSULE BY MOUTH ONE TIME DAILY   . oxaprozin (DAYPRO) 600 MG tablet TAKE ONE TABLET BY MOUTH TWICE DAILY     PHQ 2/9 Scores 11/17/2017 07/07/2017 03/25/2017 11/19/2016  PHQ - 2 Score 1 0 0 0  PHQ- 9 Score 2 0 0 0    Physical Exam  Constitutional: She is oriented to person, place, and time. She appears well-developed. No distress.  HENT:  Head: Normocephalic and atraumatic.  Neck: Normal range of motion. Neck supple.  Cardiovascular: Normal rate, regular rhythm and normal heart sounds.  Pulmonary/Chest: Effort normal and breath sounds normal. No respiratory distress.  Musculoskeletal: Normal range of motion.  Lymphadenopathy:    She has no cervical adenopathy.  Neurological: She is alert and oriented to person, place, and time.  Skin: Skin is warm and dry. No rash noted.  Psychiatric: She has a normal mood and affect. Her speech is normal and behavior is normal. Thought content normal.  Nursing note and vitals reviewed.   BP 134/74 (BP Location: Right Arm, Patient Position: Sitting, Cuff Size: Normal)   Pulse 64   Ht 5\' 3"  (1.6 m)   Wt 221 lb (100.2 kg)   SpO2 96%   BMI 39.15 kg/m   Assessment and Plan: 1. DM type 2, controlled, with complication (HCC) Continue current therapy Schedule eye  exam - Basic metabolic panel - Hemoglobin A1c  2. Essential (primary) hypertension Controlled on current therapy - amLODipine (NORVASC) 2.5 MG tablet; Take 1 tablet (2.5 mg total) by mouth daily.  Dispense: 90 tablet; Refill: 3 - hydrochlorothiazide (HYDRODIURIL) 25 MG tablet; Take 1 tablet (25 mg total) by mouth daily.  Dispense: 90 tablet; Refill: 3  3. Depression, major, recurrent, in partial remission ([place]) Doing well on Lexapro  4. Need for influenza vaccination - Flu vaccine HIGH DOSE PF   Partially dictated using Editor, commissioning. Any errors are unintentional.  Halina Maidens, MD [facility]  11/17/2017

## 2017-11-18 ENCOUNTER — Other Ambulatory Visit: Payer: Self-pay | Admitting: Internal Medicine

## 2017-11-18 ENCOUNTER — Other Ambulatory Visit: Payer: Self-pay

## 2017-11-18 LAB — BASIC METABOLIC PANEL
BUN/Creatinine Ratio: 27 (ref 12–28)
BUN: 21 mg/dL (ref 8–27)
CO2: 22 mmol/L (ref 20–29)
Calcium: 9.2 mg/dL (ref 8.7–10.3)
Chloride: 97 mmol/L (ref 96–106)
Creatinine, Ser: 0.78 mg/dL (ref 0.57–1.00)
GFR calc Af Amer: 85 mL/min/{1.73_m2} (ref >59)
GFR calc non Af Amer: 74 mL/min/{1.73_m2} (ref >59)
Glucose: 132 mg/dL — ABNORMAL HIGH (ref 65–99)
Potassium: 4.4 mmol/L (ref 3.5–5.2)
Sodium: 138 mmol/L (ref 134–144)

## 2017-11-18 LAB — HEMOGLOBIN A1C
Est. average glucose Bld gHb Est-mCnc: 166 mg/dL
Hgb A1c MFr Bld: 7.4 % — ABNORMAL HIGH (ref 4.8–5.6)

## 2018-03-02 DIAGNOSIS — R079 Chest pain, unspecified: Secondary | ICD-10-CM | POA: Diagnosis not present

## 2018-03-02 DIAGNOSIS — M940 Chondrocostal junction syndrome [Tietze]: Secondary | ICD-10-CM | POA: Diagnosis not present

## 2018-03-18 ENCOUNTER — Other Ambulatory Visit: Payer: Self-pay

## 2018-03-18 ENCOUNTER — Ambulatory Visit (INDEPENDENT_AMBULATORY_CARE_PROVIDER_SITE_OTHER): Payer: Medicare Other | Admitting: Internal Medicine

## 2018-03-18 ENCOUNTER — Encounter: Payer: Self-pay | Admitting: Internal Medicine

## 2018-03-18 VITALS — BP 120/80 | HR 68 | Resp 16 | Ht 63.0 in | Wt 219.0 lb

## 2018-03-18 DIAGNOSIS — I10 Essential (primary) hypertension: Secondary | ICD-10-CM

## 2018-03-18 DIAGNOSIS — E118 Type 2 diabetes mellitus with unspecified complications: Secondary | ICD-10-CM

## 2018-03-18 DIAGNOSIS — F3341 Major depressive disorder, recurrent, in partial remission: Secondary | ICD-10-CM

## 2018-03-18 NOTE — Progress Notes (Signed)
Date:  03/18/2018   Name:  Kimberly Walls   DOB:  [birth year]   MRN:  [id number]   Chief Complaint: Hypertension and Diabetes  Hypertension  This is a chronic problem. The problem is controlled. Pertinent negatives include no chest pain, headaches, palpitations or shortness of breath. Past treatments include ACE inhibitors, calcium channel blockers and diuretics.  Diabetes  She presents for her follow-up diabetic visit. She has type 2 diabetes mellitus. Pertinent negatives for hypoglycemia include no headaches or tremors. Pertinent negatives for diabetes include no chest pain, no fatigue, no polydipsia and no polyuria. Current diabetic treatment includes oral agent (monotherapy). She monitors blood glucose at home 1-2 x per day. Her breakfast blood glucose is taken between 6-7 am. Her breakfast blood glucose range is generally 110-130 mg/dl. An ACE inhibitor/angiotensin II receptor blocker is being taken. Eye exam is not current.  Depression         This is a chronic problem.The problem is unchanged.  Associated symptoms include no fatigue, no appetite change and no headaches.  Past treatments include SSRIs - Selective serotonin reuptake inhibitors.  Compliance with treatment is good.  Previous treatment provided significant relief.  Lab Results  Component Value Date   HGBA1C 7.4 (H) 11/17/2017   Lab Results  Component Value Date   CREATININE 0.78 11/17/2017   BUN 21 11/17/2017   NA 138 11/17/2017   K 4.4 11/17/2017   CL 97 11/17/2017   CO2 22 11/17/2017     Review of Systems  Constitutional: Negative for appetite change, fatigue, fever and unexpected weight change.  HENT: Negative for tinnitus and trouble swallowing.   Eyes: Negative for visual disturbance.  Respiratory: Positive for cough (slight cough for several weeks). Negative for chest tightness and shortness of breath.   Cardiovascular: Negative for chest pain, palpitations and leg swelling.  Gastrointestinal: Negative for  abdominal pain.  Endocrine: Negative for polydipsia and polyuria.  Genitourinary: Negative for dysuria and hematuria.  Musculoskeletal: Negative for arthralgias.  Neurological: Negative for tremors, numbness and headaches.  Psychiatric/Behavioral: Positive for depression. Negative for dysphoric mood.    Patient Active Problem List   Diagnosis Date Noted  . BMI 38.0-38.9,adult 06/29/2015  . History of pulmonary embolus (PE) 06/29/2015  . DM type 2, controlled, with complication ([place]) 17/61/6073  . Arthritis, degenerative 12/05/2014  . DDD (degenerative disc disease), lumbar 08/29/2014  . Hyperlipidemia associated with type 2 diabetes mellitus ([place]) 08/29/2014  . Essential (primary) hypertension 08/29/2014  . Acid reflux 08/29/2014  . Herpes 08/29/2014  . Obstructive sleep apnea of adult 08/29/2014  . Depression, major, recurrent, in partial remission ([place]) 08/29/2014  . Headache, tension-type 08/29/2014  . Dermatophytosis of groin 08/29/2014  . History of colon polyps 02/19/2008  . Personal history of malignant neoplasm of breast 02/19/1991    Allergies  Allergen Reactions  . Prochlorperazine   . Pneumococcal Vaccine Rash  . Tetanus Toxoids Rash    Past Surgical History:  Procedure Laterality Date  . BUNIONECTOMY Bilateral   . CARPAL TUNNEL RELEASE Left   . LUMBAR LAMINECTOMY    . MASTECTOMY MODIFIED RADICAL Bilateral 1993  . PARTIAL HYSTERECTOMY    . TOTAL KNEE ARTHROPLASTY Bilateral   . TOTAL SHOULDER REPLACEMENT Bilateral     Social History   Tobacco Use  . Smoking status: Never Smoker  . Smokeless tobacco: Never Used  . Tobacco comment: smoking cessation materials not required  Substance Use Topics  . Alcohol use: Not Currently  . Drug  use: No     Medication list has been reviewed and updated.  Current Meds  Medication Sig  . amLODipine (NORVASC) 2.5 MG tablet Take 1 tablet (2.5 mg total) by mouth daily.  Marland Kitchen ascorbic acid (VITAMIN C) 250 MG tablet Take  250 mg by mouth daily.  Marland Kitchen aspirin 81 MG tablet Take 1 tablet by mouth daily.  Marland Kitchen atorvastatin (LIPITOR) 10 MG tablet TAKE ONE TABLET BY MOUTH ONE TIME DAILY   . BIOTIN 5000 PO Take 1 tablet by mouth daily.   . Calcium Carb-Cholecalciferol (CALCIUM + D3) 600-200 MG-UNIT TABS Take 1 tablet by mouth daily.  . Cinnamon 500 MG TABS Take 2 tablets by mouth daily.  . Coenzyme Q10 (COQ10) 200 MG CAPS Take 1 capsule by mouth daily.  . cyanocobalamin 1000 MCG tablet Take 1,000 mcg by mouth daily.  Marland Kitchen desloratadine (CLARINEX) 5 MG tablet TAKE ONE TABLET BY MOUTH ONE TIME DAILY   . escitalopram (LEXAPRO) 10 MG tablet Take 1 tablet (10 mg total) by mouth daily.  . hydrochlorothiazide (HYDRODIURIL) 25 MG tablet Take 1 tablet (25 mg total) by mouth daily.  Marland Kitchen lisinopril (PRINIVIL,ZESTRIL) 20 MG tablet TAKE ONE TABLET BY MOUTH ONE TIME DAILY   . Magnesium 400 MG CAPS Take 1 tablet by mouth daily.   . metFORMIN (GLUCOPHAGE-XR) 500 MG 24 hr tablet TAKE ONE TABLET BY MOUTH ONCE DAILY WITH BREAKFAST  . Multiple Vitamin (MULTIVITAMIN) capsule Take 1 capsule by mouth daily.  Marland Kitchen omeprazole (PRILOSEC) 20 MG capsule TAKE ONE CAPSULE BY MOUTH ONE TIME DAILY   . oxaprozin (DAYPRO) 600 MG tablet TAKE ONE TABLET BY MOUTH TWICE DAILY     PHQ 2/9 Scores 03/18/2018 11/17/2017 07/07/2017 03/25/2017  PHQ - 2 Score 0 1 0 0  PHQ- 9 Score 0 2 0 0    Physical Exam Vitals signs and nursing note reviewed.  Constitutional:      General: She is not in acute distress.    Appearance: She is well-developed.  HENT:     Head: Normocephalic and atraumatic.     Right Ear: Tympanic membrane and ear canal normal.     Left Ear: Tympanic membrane and ear canal normal.     Nose:     Right Sinus: No maxillary sinus tenderness.     Left Sinus: No maxillary sinus tenderness.     Mouth/Throat:     Mouth: No oral lesions.     Pharynx: No posterior oropharyngeal erythema.  Cardiovascular:     Rate and Rhythm: Normal rate and regular rhythm.    Pulmonary:     Effort: Pulmonary effort is normal. No respiratory distress.     Breath sounds: Normal breath sounds and air entry. No decreased breath sounds.  Musculoskeletal: Normal range of motion.  Skin:    General: Skin is warm and dry.     Findings: No rash.  Neurological:     Mental Status: She is alert and oriented to person, place, and time.  Psychiatric:        Mood and Affect: Mood normal.        Behavior: Behavior normal.        Thought Content: Thought content normal.        Judgment: Judgment normal.    Wt Readings from Last 3 Encounters:  03/18/18 219 lb (99.3 kg)  11/17/17 221 lb (100.2 kg)  07/17/17 216 lb (98 kg)     BP 120/80   Pulse 68   Resp 16  Ht 5\' 3"  (1.6 m)   Wt 219 lb (99.3 kg)   SpO2 98%   BMI 38.79 kg/m   Assessment and Plan: 1. Essential (primary) hypertension controlled  2. DM type 2, controlled, with complication ([place]) Doing well on current therapy Schedule eye exam - Hemoglobin A1c - Comprehensive metabolic panel  3. Depression, major, recurrent, in partial remission ([place]) controlled   Partially dictated using Editor, commissioning. Any errors are unintentional.  Halina Maidens, MD [facility]  03/18/2018

## 2018-03-19 LAB — COMPREHENSIVE METABOLIC PANEL
ALT: 27 IU/L (ref 0–32)
AST: 35 IU/L (ref 0–40)
Albumin/Globulin Ratio: 1.6 (ref 1.2–2.2)
Albumin: 4.1 g/dL (ref 3.7–4.7)
Alkaline Phosphatase: 102 IU/L (ref 39–117)
BUN/Creatinine Ratio: 22 (ref 12–28)
BUN: 20 mg/dL (ref 8–27)
Bilirubin Total: 0.5 mg/dL (ref 0.0–1.2)
CO2: 23 mmol/L (ref 20–29)
Calcium: 9.7 mg/dL (ref 8.7–10.3)
Chloride: 98 mmol/L (ref 96–106)
Creatinine, Ser: 0.93 mg/dL (ref 0.57–1.00)
GFR calc Af Amer: 69 mL/min/{1.73_m2} (ref >59)
GFR calc non Af Amer: 60 mL/min/{1.73_m2} (ref >59)
Globulin, Total: 2.6 g/dL (ref 1.5–4.5)
Glucose: 171 mg/dL — ABNORMAL HIGH (ref 65–99)
Potassium: 4.2 mmol/L (ref 3.5–5.2)
Sodium: 137 mmol/L (ref 134–144)
Total Protein: 6.7 g/dL (ref 6.0–8.5)

## 2018-03-19 LAB — HEMOGLOBIN A1C
Est. average glucose Bld gHb Est-mCnc: 186 mg/dL
Hgb A1c MFr Bld: 8.1 % — ABNORMAL HIGH (ref 4.8–5.6)

## 2018-03-20 NOTE — Progress Notes (Signed)
Called and spoke with patient informing of labs.  Asked her could she tolerate taking metformin BID. She said she will try this and call at the end of next week to let us know how she is doing.

## 2018-03-23 ENCOUNTER — Other Ambulatory Visit: Payer: Self-pay | Admitting: Internal Medicine

## 2018-03-25 DIAGNOSIS — D3132 Benign neoplasm of left choroid: Secondary | ICD-10-CM | POA: Diagnosis not present

## 2018-03-25 DIAGNOSIS — E119 Type 2 diabetes mellitus without complications: Secondary | ICD-10-CM | POA: Diagnosis not present

## 2018-03-25 DIAGNOSIS — H2513 Age-related nuclear cataract, bilateral: Secondary | ICD-10-CM | POA: Diagnosis not present

## 2018-03-25 DIAGNOSIS — H527 Unspecified disorder of refraction: Secondary | ICD-10-CM | POA: Diagnosis not present

## 2018-03-25 DIAGNOSIS — H019 Unspecified inflammation of eyelid: Secondary | ICD-10-CM | POA: Diagnosis not present

## 2018-03-25 LAB — HM DIABETES EYE EXAM

## 2018-03-27 ENCOUNTER — Encounter: Payer: Self-pay | Admitting: Internal Medicine

## 2018-03-30 DIAGNOSIS — L988 Other specified disorders of the skin and subcutaneous tissue: Secondary | ICD-10-CM | POA: Diagnosis not present

## 2018-03-30 DIAGNOSIS — D485 Neoplasm of uncertain behavior of skin: Secondary | ICD-10-CM | POA: Diagnosis not present

## 2018-03-30 DIAGNOSIS — L570 Actinic keratosis: Secondary | ICD-10-CM | POA: Diagnosis not present

## 2018-04-13 DIAGNOSIS — C4441 Basal cell carcinoma of skin of scalp and neck: Secondary | ICD-10-CM | POA: Diagnosis not present

## 2018-04-21 DIAGNOSIS — L570 Actinic keratosis: Secondary | ICD-10-CM | POA: Diagnosis not present

## 2018-04-21 DIAGNOSIS — C4441 Basal cell carcinoma of skin of scalp and neck: Secondary | ICD-10-CM | POA: Diagnosis not present

## 2018-04-23 ENCOUNTER — Ambulatory Visit (INDEPENDENT_AMBULATORY_CARE_PROVIDER_SITE_OTHER): Payer: Medicare Other | Admitting: Internal Medicine

## 2018-04-23 ENCOUNTER — Encounter: Payer: Self-pay | Admitting: Internal Medicine

## 2018-04-23 VITALS — BP 180/94 | HR 89 | Ht 63.0 in | Wt 219.0 lb

## 2018-04-23 DIAGNOSIS — E118 Type 2 diabetes mellitus with unspecified complications: Secondary | ICD-10-CM | POA: Diagnosis not present

## 2018-04-23 DIAGNOSIS — R42 Dizziness and giddiness: Secondary | ICD-10-CM

## 2018-04-23 DIAGNOSIS — I10 Essential (primary) hypertension: Secondary | ICD-10-CM | POA: Diagnosis not present

## 2018-04-23 NOTE — Patient Instructions (Signed)
Stop Beet juice  Check BP every day - once and record  Check blood sugar fasting (before breakfast) every day

## 2018-04-23 NOTE — Progress Notes (Signed)
Date:  04/23/2018   Name:  Kimberly Walls   DOB:  10-Jun-1940   MRN:  [id number]   Chief Complaint: Dizziness (Started 2 weeks ago. Especially when getting up in the morning.) and Hypertension (Bp high at home last week. 235/195 after driving and traveling. Waking up and sitting up she is dizzy. )  Dizziness  This is a new problem. The current episode started 1 to 4 weeks ago. The problem occurs every several days. Progression since onset: has not occured for a few days. Pertinent negatives include no arthralgias, chest pain, chills, coughing, fatigue, fever, headaches, sore throat, swollen glands, vertigo or vomiting. Nothing aggravates the symptoms. She has tried nothing for the symptoms.  Hypertension  This is a chronic problem. The problem has been rapidly worsening (very high the last few weeks) since onset. Pertinent negatives include no chest pain, headaches, palpitations or shortness of breath. Associated agents: only new item is daily consumption of 8 oz of beet juice. Past treatments include diuretics, ACE inhibitors and calcium channel blockers. There are no compliance problems.   Diabetes  She presents for her follow-up diabetic visit. She has type 2 diabetes mellitus. Her disease course has been fluctuating. Hypoglycemia symptoms include dizziness. Pertinent negatives for hypoglycemia include no headaches. Pertinent negatives for diabetes include no chest pain and no fatigue. There is no compliance (she has not been able to figure out her meter) with monitoring of blood glucose.    Review of Systems  Constitutional: Negative for chills, fatigue and fever.  HENT: Negative for ear pain, sinus pressure, sore throat and trouble swallowing.   Eyes: Negative for visual disturbance.  Respiratory: Negative for cough, chest tightness, shortness of breath and wheezing.   Cardiovascular: Negative for chest pain and palpitations.  Gastrointestinal: Negative for vomiting.  Musculoskeletal:  Negative for arthralgias.  Neurological: Positive for dizziness. Negative for vertigo, light-headedness and headaches.  Psychiatric/Behavioral: Negative for sleep disturbance.    Patient Active Problem List   Diagnosis Date Noted  . BMI 38.0-38.9,adult 06/29/2015  . History of pulmonary embolus (PE) 06/29/2015  . DM type 2, controlled, with complication ([place]) 44/81/8563  . Arthritis, degenerative 12/05/2014  . DDD (degenerative disc disease), lumbar 08/29/2014  . Hyperlipidemia associated with type 2 diabetes mellitus ([place]) 08/29/2014  . Essential (primary) hypertension 08/29/2014  . Acid reflux 08/29/2014  . Herpes 08/29/2014  . Obstructive sleep apnea of adult 08/29/2014  . Depression, major, recurrent, in partial remission ([place]) 08/29/2014  . Headache, tension-type 08/29/2014  . Dermatophytosis of groin 08/29/2014  . History of colon polyps 02/19/2008  . Personal history of malignant neoplasm of breast 02/19/1991    Allergies  Allergen Reactions  . Prochlorperazine   . Pneumococcal Vaccine Rash  . Tetanus Toxoids Rash    Past Surgical History:  Procedure Laterality Date  . BUNIONECTOMY Bilateral   . CARPAL TUNNEL RELEASE Left   . LUMBAR LAMINECTOMY    . MASTECTOMY MODIFIED RADICAL Bilateral 1993  . PARTIAL HYSTERECTOMY    . TOTAL KNEE ARTHROPLASTY Bilateral   . TOTAL SHOULDER REPLACEMENT Bilateral     Social History   Tobacco Use  . Smoking status: Never Smoker  . Smokeless tobacco: Never Used  . Tobacco comment: smoking cessation materials not required  Substance Use Topics  . Alcohol use: Not Currently  . Drug use: No     Medication list has been reviewed and updated.  Current Meds  Medication Sig  . amLODipine (NORVASC) 2.5 MG tablet Take 1  tablet (2.5 mg total) by mouth daily.  Marland Kitchen ascorbic acid (VITAMIN C) 250 MG tablet Take 250 mg by mouth daily.  Marland Kitchen aspirin 81 MG tablet Take 1 tablet by mouth daily.  Marland Kitchen atorvastatin (LIPITOR) 10 MG tablet TAKE ONE  TABLET BY MOUTH ONE TIME DAILY   . BIOTIN 5000 PO Take 1 tablet by mouth daily.   . Calcium Carb-Cholecalciferol (CALCIUM + D3) 600-200 MG-UNIT TABS Take 1 tablet by mouth daily.  . Cinnamon 500 MG TABS Take 2 tablets by mouth daily.  . Coenzyme Q10 (COQ10) 200 MG CAPS Take 1 capsule by mouth daily.  . cyanocobalamin 1000 MCG tablet Take 1,000 mcg by mouth daily.  Marland Kitchen desloratadine (CLARINEX) 5 MG tablet TAKE ONE TABLET BY MOUTH ONE TIME DAILY   . escitalopram (LEXAPRO) 10 MG tablet Take 1 tablet (10 mg total) by mouth daily.  . hydrochlorothiazide (HYDRODIURIL) 25 MG tablet Take 1 tablet (25 mg total) by mouth daily.  Marland Kitchen lisinopril (PRINIVIL,ZESTRIL) 20 MG tablet TAKE ONE TABLET BY MOUTH ONE TIME DAILY   . Magnesium 400 MG CAPS Take 1 tablet by mouth daily.   . metFORMIN (GLUCOPHAGE-XR) 500 MG 24 hr tablet Take 1 tablet (500 mg total) by mouth 2 (two) times daily.  . Multiple Vitamin (MULTIVITAMIN) capsule Take 1 capsule by mouth daily.  Marland Kitchen nystatin cream (MYCOSTATIN) APPLY TOPICALLY 2 TIMES DAILY  . omeprazole (PRILOSEC) 20 MG capsule TAKE ONE CAPSULE BY MOUTH ONE TIME DAILY   . oxaprozin (DAYPRO) 600 MG tablet TAKE ONE TABLET BY MOUTH TWICE DAILY     PHQ 2/9 Scores 03/18/2018 11/17/2017 07/07/2017 03/25/2017  PHQ - 2 Score 0 1 0 0  PHQ- 9 Score 0 2 0 0    Physical Exam Vitals signs and nursing note reviewed.  Constitutional:      General: She is not in acute distress.    Appearance: She is well-developed.  HENT:     Head: Normocephalic and atraumatic.     Right Ear: Tympanic membrane and ear canal normal.     Left Ear: Tympanic membrane and ear canal normal.     Mouth/Throat:     Mouth: Mucous membranes are moist.     Pharynx: No oropharyngeal exudate or posterior oropharyngeal erythema.  Neck:     Musculoskeletal: Normal range of motion and neck supple.  Cardiovascular:     Rate and Rhythm: Normal rate and regular rhythm.     Pulses: Normal pulses.  Pulmonary:     Effort:  Pulmonary effort is normal. No respiratory distress.     Breath sounds: Normal breath sounds. No decreased breath sounds or wheezing.  Musculoskeletal: Normal range of motion.  Skin:    General: Skin is warm and dry.     Findings: No rash.  Neurological:     Mental Status: She is alert and oriented to person, place, and time.  Psychiatric:        Behavior: Behavior normal.        Thought Content: Thought content normal.     BP (!) 198/100   Pulse 89   Ht 5\' 3"  (1.6 m)   Wt 219 lb (99.3 kg)   SpO2 95%   BMI 38.79 kg/m   Assessment and Plan: 1. Essential (primary) hypertension Elevated for unclear reasons Stop Beet juice Monitor at home daily - call if still elevated after a week Follow up in 6 weeks  2. DM type 2, controlled, with complication ([place]) Begin check BS daily - pt  instructed in use of her meter and lancet device  3. Vertigo Likely viral since sx have not recurred for several days If sx resume, call for instructions to begin Dramamine   Partially dictated using Editor, commissioning. Any errors are unintentional.  Halina Maidens, MD [facility]  04/23/2018

## 2018-05-20 ENCOUNTER — Other Ambulatory Visit: Payer: Self-pay | Admitting: Internal Medicine

## 2018-05-20 DIAGNOSIS — F3341 Major depressive disorder, recurrent, in partial remission: Secondary | ICD-10-CM

## 2018-06-04 ENCOUNTER — Ambulatory Visit (INDEPENDENT_AMBULATORY_CARE_PROVIDER_SITE_OTHER): Payer: Medicare Other | Admitting: Internal Medicine

## 2018-06-04 ENCOUNTER — Other Ambulatory Visit: Payer: Self-pay

## 2018-06-04 ENCOUNTER — Encounter: Payer: Self-pay | Admitting: Internal Medicine

## 2018-06-04 VITALS — BP 122/78 | HR 89 | Resp 16 | Ht 63.0 in | Wt 219.0 lb

## 2018-06-04 DIAGNOSIS — I10 Essential (primary) hypertension: Secondary | ICD-10-CM | POA: Diagnosis not present

## 2018-06-04 DIAGNOSIS — E118 Type 2 diabetes mellitus with unspecified complications: Secondary | ICD-10-CM | POA: Diagnosis not present

## 2018-06-04 NOTE — Patient Instructions (Signed)
This information is directly available on the CDC website: https://www.cdc.gov/coronavirus/2019-ncov/if-you-are-sick/steps-when-sick.html    Source:CDC Reference to specific commercial products, manufacturers, companies, or trademarks does not constitute its endorsement or recommendation by the U.S. Government, Department of Health and Human Services, or Centers for Disease Control and Prevention.  

## 2018-06-04 NOTE — Progress Notes (Signed)
Date:  06/04/2018   Name:  Kimberly Walls   DOB:  May 05, 1940   MRN:  [id number]   Chief Complaint: Hypertension (6 week Follow up. )  Hypertension  This is a chronic problem. The problem has been gradually improving since onset. The problem is controlled. Pertinent negatives include no chest pain, headaches, palpitations or shortness of breath.  Diabetes  She presents for her follow-up diabetic visit. She has type 2 diabetes mellitus. Her disease course has been fluctuating. Pertinent negatives for hypoglycemia include no headaches or tremors. Pertinent negatives for diabetes include no chest pain, no fatigue, no polydipsia and no polyuria. Current diabetic treatment includes oral agent (monotherapy). She is compliant with treatment all of the time. She is following a generally healthy diet. An ACE inhibitor/angiotensin II receptor blocker is being taken.   Lab Results  Component Value Date   HGBA1C 8.1 (H) 03/18/2018   Lab Results  Component Value Date   CREATININE 0.93 03/18/2018   BUN 20 03/18/2018   NA 137 03/18/2018   K 4.2 03/18/2018   CL 98 03/18/2018   CO2 23 03/18/2018     Review of Systems  Constitutional: Negative for appetite change, fatigue, fever and unexpected weight change.  HENT: Negative for tinnitus and trouble swallowing.   Eyes: Negative for visual disturbance.  Respiratory: Negative for cough, chest tightness and shortness of breath.   Cardiovascular: Negative for chest pain, palpitations and leg swelling.  Gastrointestinal: Negative for abdominal pain.  Endocrine: Negative for polydipsia and polyuria.  Genitourinary: Negative for dysuria and hematuria.  Musculoskeletal: Negative for arthralgias.  Neurological: Negative for tremors, numbness and headaches.  Psychiatric/Behavioral: Negative for dysphoric mood.    Patient Active Problem List   Diagnosis Date Noted  . BMI 38.0-38.9,adult 06/29/2015  . History of pulmonary embolus (PE) 06/29/2015  . DM  type 2, controlled, with complication ([place]) 55/73/2202  . Arthritis, degenerative 12/05/2014  . DDD (degenerative disc disease), lumbar 08/29/2014  . Hyperlipidemia associated with type 2 diabetes mellitus ([place]) 08/29/2014  . Essential (primary) hypertension 08/29/2014  . Acid reflux 08/29/2014  . Herpes 08/29/2014  . Obstructive sleep apnea of adult 08/29/2014  . Depression, major, recurrent, in partial remission ([place]) 08/29/2014  . Headache, tension-type 08/29/2014  . Dermatophytosis of groin 08/29/2014  . History of colon polyps 02/19/2008  . Personal history of malignant neoplasm of breast 02/19/1991    Allergies  Allergen Reactions  . Prochlorperazine   . Pneumococcal Vaccine Rash  . Tetanus Toxoids Rash    Past Surgical History:  Procedure Laterality Date  . BUNIONECTOMY Bilateral   . CARPAL TUNNEL RELEASE Left   . LUMBAR LAMINECTOMY    . MASTECTOMY MODIFIED RADICAL Bilateral 1993  . PARTIAL HYSTERECTOMY    . TOTAL KNEE ARTHROPLASTY Bilateral   . TOTAL SHOULDER REPLACEMENT Bilateral     Social History   Tobacco Use  . Smoking status: Never Smoker  . Smokeless tobacco: Never Used  . Tobacco comment: smoking cessation materials not required  Substance Use Topics  . Alcohol use: Not Currently  . Drug use: No     Medication list has been reviewed and updated.  Current Meds  Medication Sig  . amLODipine (NORVASC) 2.5 MG tablet Take 1 tablet (2.5 mg total) by mouth daily.  Marland Kitchen ascorbic acid (VITAMIN C) 250 MG tablet Take 250 mg by mouth daily.  Marland Kitchen aspirin 81 MG tablet Take 1 tablet by mouth daily.  Marland Kitchen atorvastatin (LIPITOR) 10 MG tablet TAKE ONE  TABLET BY MOUTH ONE TIME DAILY   . BIOTIN 5000 PO Take 1 tablet by mouth daily.   . Calcium Carb-Cholecalciferol (CALCIUM + D3) 600-200 MG-UNIT TABS Take 1 tablet by mouth daily.  . Cinnamon 500 MG TABS Take 2 tablets by mouth daily.  . Coenzyme Q10 (COQ10) 200 MG CAPS Take 1 capsule by mouth daily.  . cyanocobalamin 1000  MCG tablet Take 1,000 mcg by mouth daily.  Marland Kitchen desloratadine (CLARINEX) 5 MG tablet TAKE ONE TABLET BY MOUTH ONE TIME DAILY   . escitalopram (LEXAPRO) 10 MG tablet TAKE ONE TABLET BY MOUTH ONE TIME DAILY   . hydrochlorothiazide (HYDRODIURIL) 25 MG tablet Take 1 tablet (25 mg total) by mouth daily.  Marland Kitchen lisinopril (PRINIVIL,ZESTRIL) 20 MG tablet TAKE ONE TABLET BY MOUTH ONE TIME DAILY   . Magnesium 400 MG CAPS Take 1 tablet by mouth daily.   . metFORMIN (GLUCOPHAGE-XR) 500 MG 24 hr tablet Take 1 tablet (500 mg total) by mouth 2 (two) times daily.  . Multiple Vitamin (MULTIVITAMIN) capsule Take 1 capsule by mouth daily.  Marland Kitchen nystatin cream (MYCOSTATIN) APPLY TOPICALLY 2 TIMES DAILY  . omeprazole (PRILOSEC) 20 MG capsule TAKE ONE CAPSULE BY MOUTH ONE TIME DAILY   . oxaprozin (DAYPRO) 600 MG tablet TAKE ONE TABLET BY MOUTH TWICE DAILY     PHQ 2/9 Scores 06/04/2018 03/18/2018 11/17/2017 07/07/2017  PHQ - 2 Score 0 0 1 0  PHQ- 9 Score 0 0 2 0    BP Readings from Last 3 Encounters:  06/04/18 122/78  04/23/18 (!) 180/94  03/18/18 120/80    Physical Exam Vitals signs and nursing note reviewed.  Constitutional:      General: She is not in acute distress.    Appearance: She is well-developed.  HENT:     Head: Normocephalic and atraumatic.  Neck:     Musculoskeletal: Normal range of motion and neck supple.  Cardiovascular:     Rate and Rhythm: Normal rate and regular rhythm.  Pulmonary:     Effort: Pulmonary effort is normal. No respiratory distress.     Breath sounds: Normal breath sounds. No wheezing or rales.  Musculoskeletal: Normal range of motion.     Right lower leg: No edema.     Left lower leg: No edema.  Skin:    General: Skin is warm and dry.     Findings: No rash.  Neurological:     Mental Status: She is alert and oriented to person, place, and time.  Psychiatric:        Behavior: Behavior normal.        Thought Content: Thought content normal.     Wt Readings from Last 3  Encounters:  06/04/18 219 lb (99.3 kg)  04/23/18 219 lb (99.3 kg)  03/18/18 219 lb (99.3 kg)    BP 122/78   Pulse 89   Resp 16   Ht 5\' 3"  (1.6 m)   Wt 219 lb (99.3 kg)   SpO2 98%   BMI 38.79 kg/m   Assessment and Plan: 1. Essential (primary) hypertension Better control off of beet supplement Continue current regimen  2. DM type 2, controlled, with complication ([place]) Reduce blood sugar checks to 2-3 times per week Continue metformin twice a day Check A1C next visit   Partially dictated using Editor, commissioning. Any errors are unintentional.  Halina Maidens, MD [facility]  06/04/2018

## 2018-06-14 IMAGING — CR DG CHEST 2V
2 series · 2 of 2 positions shown · non-contrast
Comparison: 06/27/2014

CLINICAL DATA: Follow-up.  Breast cancer with bilateral mastectomy.

EXAM:
CHEST  2 VIEW

[chest pa]
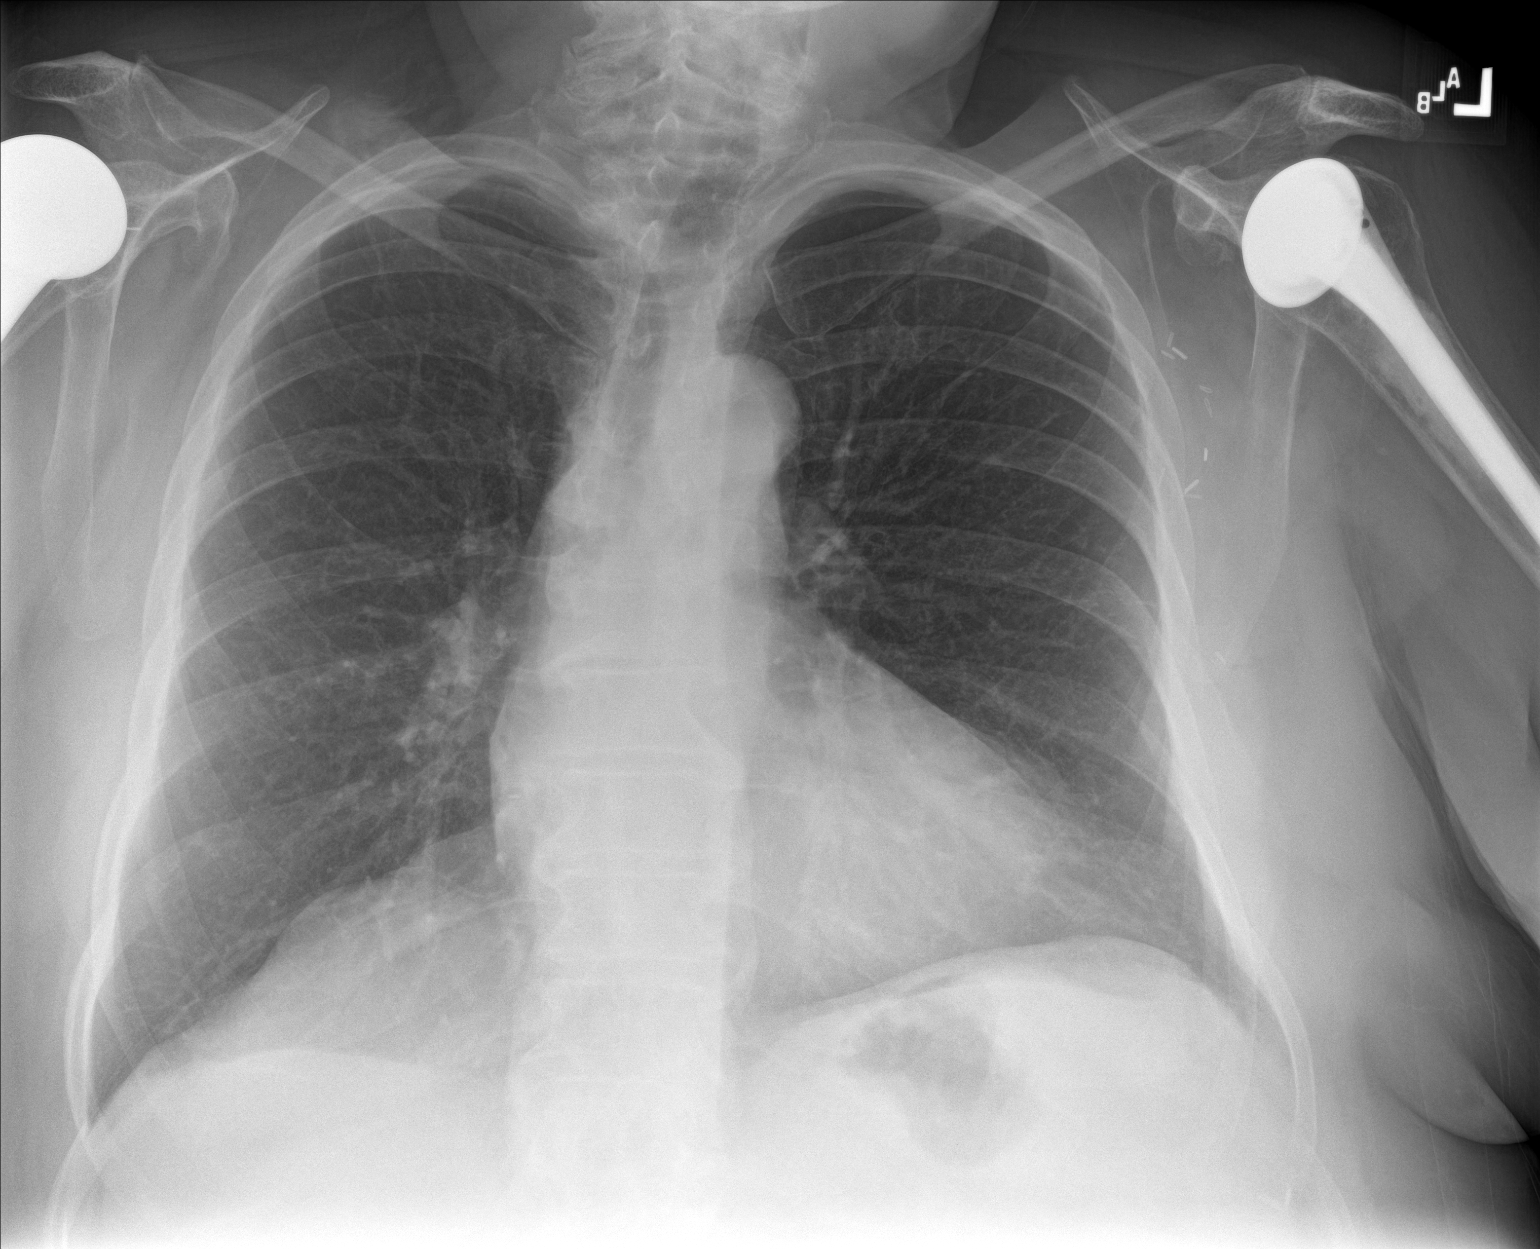

[chest lat]
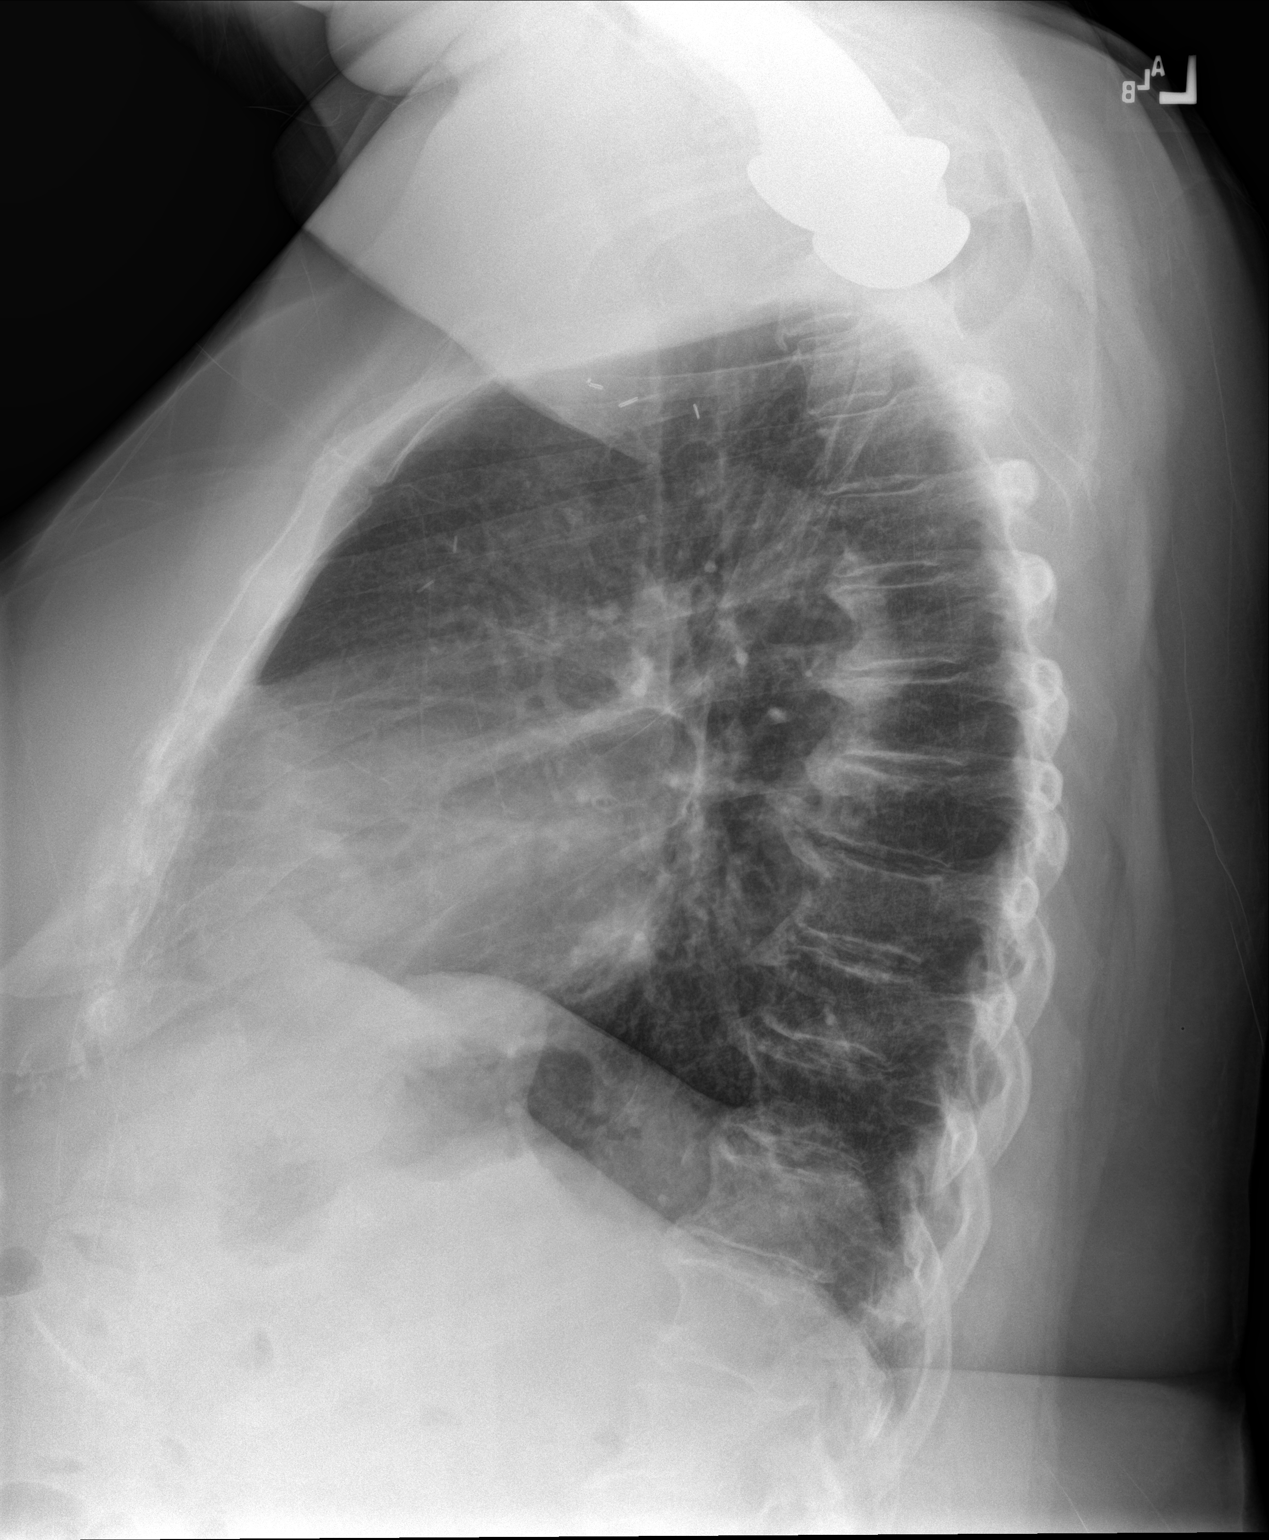

[2 of 2 positions shown; findings below may reference images not displayed]

FINDINGS: Heart and mediastinal contours are within normal limits. No focal
opacities or effusions. No acute bony abnormality. Degenerative
spurring in the thoracic spine. Bilateral shoulder replacements
noted
IMPRESSION: No active cardiopulmonary disease.

## 2018-07-08 ENCOUNTER — Ambulatory Visit (INDEPENDENT_AMBULATORY_CARE_PROVIDER_SITE_OTHER): Payer: Medicare Other

## 2018-07-08 VITALS — BP 129/83 | HR 77 | Ht 63.0 in | Wt 215.4 lb

## 2018-07-08 DIAGNOSIS — Z780 Asymptomatic menopausal state: Secondary | ICD-10-CM

## 2018-07-08 DIAGNOSIS — Z1211 Encounter for screening for malignant neoplasm of colon: Secondary | ICD-10-CM | POA: Diagnosis not present

## 2018-07-08 DIAGNOSIS — Z0000 Encounter for general adult medical examination without abnormal findings: Secondary | ICD-10-CM

## 2018-07-08 NOTE — Patient Instructions (Signed)
Kimberly Walls , Thank you for taking time to come for your Medicare Wellness Visit. I appreciate your ongoing commitment to your health goals. Please review the following plan we discussed and let me know if I can assist you in the future.   Screening recommendations/referrals: Colonoscopy: done 07/02/13. Referral sent to Dr. Epimenio Foot for screening colonoscopy. Mammogram: no longer required Bone Density: Please call [phone] to schedule your bone density screening.  Recommended yearly ophthalmology/optometry visit for glaucoma screening and checkup Recommended yearly dental visit for hygiene and checkup  Vaccinations: Influenza vaccine: done 11/17/17 Pneumococcal vaccine: done 06/27/14  Tdap vaccine: done 07/01/16 Shingles vaccine: Shingrix discussed. Please contact your pharmacy for coverage information.   Advanced directives: Advance directive discussed with you today. I have mailed a copy for you to complete at home and have notarized. Once this is complete please bring a copy in to our office so we can scan it into your chart.  Conditions/risks identified: Recommend healthy eating and physical activity for desired weight loss.   Next appointment: Please follow up in one year for your Medicare Annual Wellness visit.     Preventive Care 77 Years and Older, Female Preventive care refers to lifestyle choices and visits with your health care provider that can promote health and wellness. What does preventive care include?  A yearly physical exam. This is also called an annual well check.  Dental exams once or twice a year.  Routine eye exams. Ask your health care provider how often you should have your eyes checked.  Personal lifestyle choices, including:  Daily care of your teeth and gums.  Regular physical activity.  Eating a healthy diet.  Avoiding tobacco and drug use.  Limiting alcohol use.  Practicing safe sex.  Taking low-dose aspirin every day.  Taking vitamin and  mineral supplements as recommended by your health care provider. What happens during an annual well check? The services and screenings done by your health care provider during your annual well check will depend on your age, overall health, lifestyle risk factors, and family history of disease. Counseling  Your health care provider may ask you questions about your:  Alcohol use.  Tobacco use.  Drug use.  Emotional well-being.  Home and relationship well-being.  Sexual activity.  Eating habits.  History of falls.  Memory and ability to understand (cognition).  Work and work Statistician.  Reproductive health. Screening  You may have the following tests or measurements:  Height, weight, and BMI.  Blood pressure.  Lipid and cholesterol levels. These may be checked every 5 years, or more frequently if you are over 50 years old.  Skin check.  Lung cancer screening. You may have this screening every year starting at age 45 if you have a 30-pack-year history of smoking and currently smoke or have quit within the past 15 years.  Fecal occult blood test (FOBT) of the stool. You may have this test every year starting at age 27.  Flexible sigmoidoscopy or colonoscopy. You may have a sigmoidoscopy every 5 years or a colonoscopy every 10 years starting at age 43.  Hepatitis C blood test.  Hepatitis B blood test.  Sexually transmitted disease (STD) testing.  Diabetes screening. This is done by checking your blood sugar (glucose) after you have not eaten for a while (fasting). You may have this done every 1-3 years.  Bone density scan. This is done to screen for osteoporosis. You may have this done starting at age 47.  Mammogram. This may be  done every 1-2 years. Talk to your health care provider about how often you should have regular mammograms. Talk with your health care provider about your test results, treatment options, and if necessary, the need for more tests. Vaccines   Your health care provider may recommend certain vaccines, such as:  Influenza vaccine. This is recommended every year.  Tetanus, diphtheria, and acellular pertussis (Tdap, Td) vaccine. You may need a Td booster every 10 years.  Zoster vaccine. You may need this after age 48.  Pneumococcal 13-valent conjugate (PCV13) vaccine. One dose is recommended after age 71.  Pneumococcal polysaccharide (PPSV23) vaccine. One dose is recommended after age 26. Talk to your health care provider about which screenings and vaccines you need and how often you need them. This information is not intended to replace advice given to you by your health care provider. Make sure you discuss any questions you have with your health care provider. Document Released: 03/03/2015 Document Revised: 10/25/2015 Document Reviewed: 12/06/2014 Elsevier Interactive Patient Education  2017 [place] Prevention in the Home Falls can cause injuries. They can happen to people of all ages. There are many things you can do to make your home safe and to help prevent falls. What can I do on the outside of my home?  Regularly fix the edges of walkways and driveways and fix any cracks.  Remove anything that might make you trip as you walk through a door, such as a raised step or threshold.  Trim any bushes or trees on the path to your home.  Use bright outdoor lighting.  Clear any walking paths of anything that might make someone trip, such as rocks or tools.  Regularly check to see if handrails are loose or broken. Make sure that both sides of any steps have handrails.  Any raised decks and porches should have guardrails on the edges.  Have any leaves, snow, or ice cleared regularly.  Use sand or salt on walking paths during winter.  Clean up any spills in your garage right away. This includes oil or grease spills. What can I do in the bathroom?  Use night lights.  Install grab bars by the toilet and in the  tub and shower. Do not use towel bars as grab bars.  Use non-skid mats or decals in the tub or shower.  If you need to sit down in the shower, use a plastic, non-slip stool.  Keep the floor dry. Clean up any water that spills on the floor as soon as it happens.  Remove soap buildup in the tub or shower regularly.  Attach bath mats securely with double-sided non-slip rug tape.  Do not have throw rugs and other things on the floor that can make you trip. What can I do in the bedroom?  Use night lights.  Make sure that you have a light by your bed that is easy to reach.  Do not use any sheets or blankets that are too big for your bed. They should not hang down onto the floor.  Have a firm chair that has side arms. You can use this for support while you get dressed.  Do not have throw rugs and other things on the floor that can make you trip. What can I do in the kitchen?  Clean up any spills right away.  Avoid walking on wet floors.  Keep items that you use a lot in easy-to-reach places.  If you need to reach something above you, use  a strong step stool that has a grab bar.  Keep electrical cords out of the way.  Do not use floor polish or wax that makes floors slippery. If you must use wax, use non-skid floor wax.  Do not have throw rugs and other things on the floor that can make you trip. What can I do with my stairs?  Do not leave any items on the stairs.  Make sure that there are handrails on both sides of the stairs and use them. Fix handrails that are broken or loose. Make sure that handrails are as long as the stairways.  Check any carpeting to make sure that it is firmly attached to the stairs. Fix any carpet that is loose or worn.  Avoid having throw rugs at the top or bottom of the stairs. If you do have throw rugs, attach them to the floor with carpet tape.  Make sure that you have a light switch at the top of the stairs and the bottom of the stairs. If you  do not have them, ask someone to add them for you. What else can I do to help prevent falls?  Wear shoes that:  Do not have high heels.  Have rubber bottoms.  Are comfortable and fit you well.  Are closed at the toe. Do not wear sandals.  If you use a stepladder:  Make sure that it is fully opened. Do not climb a closed stepladder.  Make sure that both sides of the stepladder are locked into place.  Ask someone to hold it for you, if possible.  Clearly mark and make sure that you can see:  Any grab bars or handrails.  First and last steps.  Where the edge of each step is.  Use tools that help you move around (mobility aids) if they are needed. These include:  Canes.  Walkers.  Scooters.  Crutches.  Turn on the lights when you go into a dark area. Replace any light bulbs as soon as they burn out.  Set up your furniture so you have a clear path. Avoid moving your furniture around.  If any of your floors are uneven, fix them.  If there are any pets around you, be aware of where they are.  Review your medicines with your doctor. Some medicines can make you feel dizzy. This can increase your chance of falling. Ask your doctor what other things that you can do to help prevent falls. This information is not intended to replace advice given to you by your health care provider. Make sure you discuss any questions you have with your health care provider. Document Released: 12/01/2008 Document Revised: 07/13/2015 Document Reviewed: 03/11/2014 Elsevier Interactive Patient Education  2017 [employer].

## 2018-07-08 NOTE — Progress Notes (Signed)
Subjective:   Kimberly Walls is a 78 y.o. female who presents for Medicare Annual (Subsequent) preventive examination.  Virtual Visit via Telephone Note  I connected with Kimberly Walls on 07/08/18 at 10:00 AM EDT by telephone and verified that I am speaking with the correct person using two identifiers.  Medicare Annual Wellness visit completed telephonically due to Covid-19 pandemic.  Location: Patient: home Provider: office   I discussed the limitations, risks, security and privacy concerns of performing an evaluation and management service by telephone and the availability of in person appointments. The patient expressed understanding and agreed to proceed.  Some vital signs may be absent or patient reported.    Clemetine Marker, LPN  Review of Systems:   Cardiac Risk Factors include: advanced age (>97men, >65 women);diabetes mellitus;hypertension;dyslipidemia;obesity (BMI >30kg/m2)     Objective:     Vitals: BP 129/83 (BP Location: Left Wrist, Patient Position: Sitting)   Pulse 77   Ht 5\' 3"  (1.6 m)   Wt 215 lb 6.4 oz (97.7 kg)   BMI 38.16 kg/m   Body mass index is 38.16 kg/m.  Advanced Directives 07/07/2017 06/29/2015 03/06/2015  Does Patient Have a Medical Advance Directive? No No No  Would patient like information on creating a medical advance directive? Yes (MAU/Ambulatory/Procedural Areas - Information given) No - patient declined information No - patient declined information    Tobacco Social History   Tobacco Use  Smoking Status Never Smoker  Smokeless Tobacco Never Used  Tobacco Comment   smoking cessation materials not required     Counseling given: Not Answered Comment: smoking cessation materials not required   Clinical Intake:  Pre-visit preparation completed: Yes  Pain : No/denies pain     BMI - recorded: 38.16 Nutritional Status: BMI > 30  Obese Nutritional Risks: None Diabetes: Yes CBG done?: No Did pt. bring in CBG monitor from home?:  No   Nutrition Risk Assessment:  Has the patient had any N/V/D within the last 2 months?  No  Does the patient have any non-healing wounds?  No  Has the patient had any unintentional weight loss or weight gain?  No   Diabetes:  Is the patient diabetic?  Yes  If diabetic, was a CBG obtained today?  No  Did the patient bring in their glucometer from home?  No  How often do you monitor your CBG's? Pt does not actively check blood sugar.   Financial Strains and Diabetes Management:  Are you having any financial strains with the device, your supplies or your medication? No .  Does the patient want to be seen by Chronic Care Management for management of their diabetes?  No  Would the patient like to be referred to a Nutritionist or for Diabetic Management?  No   Diabetic Exams:  Diabetic Eye Exam: Completed 03/25/18.   Diabetic Foot Exam: Completed 03/18/18.    How often do you need to have someone help you when you read instructions, pamphlets, or other written materials from your doctor or pharmacy?: 1 - Never  Interpreter Needed?: No  Information entered by :: Clemetine Marker LPN  Past Medical History:  Diagnosis Date  . Allergy   . Anxiety   . Depression   . Diabetes mellitus without complication ([place])   . GERD (gastroesophageal reflux disease)   . Hyperlipidemia   . Hypertension   . Sleep apnea    does not use CPAP   Past Surgical History:  Procedure Laterality Date  .  BUNIONECTOMY Bilateral   . CARPAL TUNNEL RELEASE Left   . LUMBAR LAMINECTOMY    . MASTECTOMY MODIFIED RADICAL Bilateral 1993  . PARTIAL HYSTERECTOMY    . TOTAL KNEE ARTHROPLASTY Bilateral   . TOTAL SHOULDER REPLACEMENT Bilateral    Family History  Problem Relation Age of Onset  . Diabetes Mother   . Hypertension Mother   . CAD Father   . Diabetes Father   . Diabetes Sister   . Hypertension Sister   . Heart disease Brother   . Healthy Sister    Social History   Socioeconomic History  .  Marital status: Divorced    Spouse name: Not on file  . Number of children: 1  . Years of education: some college  . Highest education level: 12th grade  Occupational History  . Occupation: Retired    Comment: works part time for [employer]  Social Needs  . Financial resource strain: Not hard at all  . Food insecurity:    Worry: Never true    Inability: Never true  . Transportation needs:    Medical: No    Non-medical: No  Tobacco Use  . Smoking status: Never Smoker  . Smokeless tobacco: Never Used  . Tobacco comment: smoking cessation materials not required  Substance and Sexual Activity  . Alcohol use: Not Currently  . Drug use: No  . Sexual activity: Not Currently  Lifestyle  . Physical activity:    Days per week: 0 days    Minutes per session: 0 min  . Stress: Not at all  Relationships  . Social connections:    Talks on phone: More than three times a week    Gets together: Three times a week    Attends religious service: Never    Active member of club or organization: No    Attends meetings of clubs or organizations: Never    Relationship status: Divorced  Other Topics Concern  . Not on file  Social History Narrative  . Not on file    Outpatient Encounter Medications as of 07/08/2018  Medication Sig  . amLODipine (NORVASC) 2.5 MG tablet Take 1 tablet (2.5 mg total) by mouth daily.  Marland Kitchen ascorbic acid (VITAMIN C) 250 MG tablet Take 250 mg by mouth daily.  Marland Kitchen aspirin 81 MG tablet Take 1 tablet by mouth daily.  Marland Kitchen atorvastatin (LIPITOR) 10 MG tablet TAKE ONE TABLET BY MOUTH ONE TIME DAILY   . BIOTIN 5000 PO Take 1 tablet by mouth daily.   . Calcium Carb-Cholecalciferol (CALCIUM + D3) 600-200 MG-UNIT TABS Take 1 tablet by mouth daily.  . Cinnamon 500 MG TABS Take 2 tablets by mouth daily.  . Coenzyme Q10 (COQ10) 200 MG CAPS Take 1 capsule by mouth daily.  . cyanocobalamin 1000 MCG tablet Take 1,000 mcg by mouth daily.  Marland Kitchen desloratadine (CLARINEX) 5 MG tablet TAKE  ONE TABLET BY MOUTH ONE TIME DAILY   . escitalopram (LEXAPRO) 10 MG tablet TAKE ONE TABLET BY MOUTH ONE TIME DAILY   . hydrochlorothiazide (HYDRODIURIL) 25 MG tablet Take 1 tablet (25 mg total) by mouth daily.  Marland Kitchen lisinopril (PRINIVIL,ZESTRIL) 20 MG tablet TAKE ONE TABLET BY MOUTH ONE TIME DAILY   . metFORMIN (GLUCOPHAGE-XR) 500 MG 24 hr tablet Take 1 tablet (500 mg total) by mouth 2 (two) times daily.  . Multiple Vitamin (MULTIVITAMIN) capsule Take 1 capsule by mouth daily.  Marland Kitchen nystatin cream (MYCOSTATIN) APPLY TOPICALLY 2 TIMES DAILY  . omeprazole (PRILOSEC) 20 MG capsule TAKE  ONE CAPSULE BY MOUTH ONE TIME DAILY   . oxaprozin (DAYPRO) 600 MG tablet TAKE ONE TABLET BY MOUTH TWICE DAILY   . Magnesium 400 MG CAPS Take 1 tablet by mouth daily.    No facility-administered encounter medications on file as of 07/08/2018.     Activities of Daily Living In your present state of health, do you have any difficulty performing the following activities: 07/08/2018  Hearing? N  Comment declines hearing aids  Vision? N  Comment wears glasses  Difficulty concentrating or making decisions? N  Walking or climbing stairs? N  Dressing or bathing? N  Doing errands, shopping? N  Preparing Food and eating ? N  Using the Toilet? N  In the past six months, have you accidently leaked urine? N  Do you have problems with loss of bowel control? N  Managing your Medications? N  Managing your Finances? N  Housekeeping or managing your Housekeeping? N  Some recent data might be hidden    Patient Care Team: Glean Hess, MD as PCP - General (Internal Medicine) San Jetty, MD as Referring Physician (Gastroenterology) Magda Kiel Erlene Senters, MD as Referring Physician (Dermatology) Arelia Sneddon, MD as Consulting Physician (Otolaryngology) Nelda Bucks, MD as Referring Physician (Ophthalmology)    Assessment:   This is a routine wellness examination for Ray[place].  Exercise Activities and Dietary  recommendations Current Exercise Habits: The patient does not participate in regular exercise at present(gym closed for the past 2 months), Exercise limited by: orthopedic condition(s)  Goals    . DIET - INCREASE WATER INTAKE     Recommend to drink at least 6-8 8oz glasses of water per day.    . lose weight     Pt states she would like to lose weight over the next year and would like to be <200 lbs.        Fall Risk Fall Risk  07/08/2018 06/04/2018 06/04/2018 03/18/2018 07/07/2017  Falls in the past year? 0 0 0 0 No  Number falls in past yr: 0 0 0 0 -  Injury with Fall? 0 0 0 0 -  Follow up Falls prevention discussed - Falls evaluation completed - -   FALL RISK PREVENTION PERTAINING TO THE HOME:  Any stairs in or around the home? Yes  If so, do they handrails? Yes   Home free of loose throw rugs in walkways, pet beds, electrical cords, etc? Yes  Adequate lighting in your home to reduce risk of falls? Yes   ASSISTIVE DEVICES UTILIZED TO PREVENT FALLS:  Life alert? No  Use of a cane, walker or w/c? No  Grab bars in the bathroom? No  Shower chair or bench in shower? Yes  Elevated toilet seat or a handicapped toilet? No   DME ORDERS:  DME order needed?  No   TIMED UP AND GO:  Was the test performed? No . Telephonic visit.   Education: Fall risk prevention has been discussed.  Intervention(s) required? No   Depression Screen PHQ 2/9 Scores 07/08/2018 06/04/2018 03/18/2018 11/17/2017  PHQ - 2 Score 2 0 0 1  PHQ- 9 Score 2 0 0 2     Cognitive Function     6CIT Screen 07/08/2018 07/07/2017 07/01/2016  What Year? 0 points 0 points 0 points  What month? 0 points 0 points 0 points  What time? 0 points 0 points 0 points  Count back from 20 0 points 0 points 0 points  Months in reverse 0 points 0  points 0 points  Repeat phrase 0 points 0 points 0 points  Total Score 0 0 0    Immunization History  Administered Date(s) Administered  . Influenza, High Dose Seasonal PF  11/19/2016, 11/17/2017  . Influenza,inj,Quad PF,6+ Mos 01/01/2016  . Influenza-Unspecified 11/25/2014  . Pneumococcal Conjugate-13 06/27/2014  . Tdap 07/01/2016    Qualifies for Shingles Vaccine? Yes . Due for Shingrix. Education has been provided regarding the importance of this vaccine. Pt has been advised to call insurance company to determine out of pocket expense. Advised may also receive vaccine at local pharmacy or Health Dept. Verbalized acceptance and understanding. Pt would like to discuss at next appointment.   Tdap: Up to date  Flu Vaccine: Up to date  Pneumococcal Vaccine: cannot take due to hx of allergic reaction  Screening Tests Health Maintenance  Topic Date Due  . HEMOGLOBIN A1C  09/16/2018  . INFLUENZA VACCINE  09/19/2018  . FOOT EXAM  03/19/2019  . OPHTHALMOLOGY EXAM  03/26/2019  . TETANUS/TDAP  07/02/2026  . DEXA SCAN  Completed  . PNA vac Low Risk Adult  Discontinued    Cancer Screenings:  Colorectal Screening: Completed 07/02/13. Repeat every 5 years per patient with hx of polypectomy. Referral to GI placed today to Dr. Epimenio Foot for screening recommendations. Pt aware the office will call re: appt.  Mammogram:  No longer required.  Bone Density: Completed 03/20/2004. Results reflect NORMAL. Repeat every 2 years. Ordered today. Pt provided with contact information and advised to call to schedule appt.   Lung Cancer Screening: (Low Dose CT Chest recommended if Age 40-80 years, 30 pack-year currently smoking OR have quit w/in 15years.) does not qualify.   Lung Cancer Screening Referral: An Epic message has been sent to Burgess Estelle, RN (Oncology Nurse Navigator) regarding the possible need for this exam. Raquel Sarna will review the patient's chart to determine if the patient truly qualifies for the exam. If the patient qualifies, Raquel Sarna will order the Low Dose CT of the chest to facilitate the scheduling of this exam.  Additional Screening:  Hepatitis C Screening: no  longer required.  Vision Screening: Recommended annual ophthalmology exams for early detection of glaucoma and other disorders of the eye. Is the patient up to date with their annual eye exam?  Yes  Who is the provider or what is the name of the office in which the pt attends annual eye exams? [facility]  Dental Screening: Recommended annual dental exams for proper oral hygiene  Community Resource Referral:  CRR required this visit?  No      Plan:     I have personally reviewed and addressed the Medicare Annual Wellness questionnaire and have noted the following in the patient's chart:  A. Medical and social history B. Use of alcohol, tobacco or illicit drugs  C. Current medications and supplements D. Functional ability and status E.  Nutritional status F.  Physical activity G. Advance directives H. List of other physicians I.  Hospitalizations, surgeries, and ER visits in previous 12 months J.  [place] such as hearing and vision if needed, cognitive and depression L. Referrals and appointments   In addition, I have reviewed and discussed with patient certain preventive protocols, quality metrics, and best practice recommendations. A written personalized care plan for preventive services as well as general preventive health recommendations were provided to patient.   Signed,  Clemetine Marker, LPN Nurse Health Advisor   Nurse Notes: pt doing well and appreciative of telephonic visit  today.

## 2018-07-20 ENCOUNTER — Ambulatory Visit (INDEPENDENT_AMBULATORY_CARE_PROVIDER_SITE_OTHER): Payer: Medicare Other | Admitting: Internal Medicine

## 2018-07-20 ENCOUNTER — Ambulatory Visit
Admission: RE | Admit: 2018-07-20 | Discharge: 2018-07-20 | Disposition: A | Discharge status: Home / Self Care | Payer: Medicare Other | Source: Ambulatory Visit | Attending: Internal Medicine | Admitting: Internal Medicine

## 2018-07-20 ENCOUNTER — Other Ambulatory Visit: Payer: Self-pay

## 2018-07-20 ENCOUNTER — Encounter: Payer: Self-pay | Admitting: Internal Medicine

## 2018-07-20 ENCOUNTER — Ambulatory Visit
Admission: RE | Admit: 2018-07-20 | Discharge: 2018-07-20 | Disposition: A | Discharge status: Home / Self Care | Payer: Medicare Other | Attending: Internal Medicine | Admitting: Internal Medicine

## 2018-07-20 VITALS — BP 136/84 | HR 75 | Ht 63.0 in | Wt 217.0 lb

## 2018-07-20 DIAGNOSIS — I10 Essential (primary) hypertension: Secondary | ICD-10-CM

## 2018-07-20 DIAGNOSIS — E1169 Type 2 diabetes mellitus with other specified complication: Secondary | ICD-10-CM

## 2018-07-20 DIAGNOSIS — Z853 Personal history of malignant neoplasm of breast: Secondary | ICD-10-CM

## 2018-07-20 DIAGNOSIS — F3341 Major depressive disorder, recurrent, in partial remission: Secondary | ICD-10-CM

## 2018-07-20 DIAGNOSIS — E785 Hyperlipidemia, unspecified: Secondary | ICD-10-CM | POA: Diagnosis not present

## 2018-07-20 DIAGNOSIS — Z1211 Encounter for screening for malignant neoplasm of colon: Secondary | ICD-10-CM | POA: Diagnosis not present

## 2018-07-20 DIAGNOSIS — K219 Gastro-esophageal reflux disease without esophagitis: Secondary | ICD-10-CM | POA: Diagnosis not present

## 2018-07-20 DIAGNOSIS — E118 Type 2 diabetes mellitus with unspecified complications: Secondary | ICD-10-CM

## 2018-07-20 DIAGNOSIS — Z13820 Encounter for screening for osteoporosis: Secondary | ICD-10-CM

## 2018-07-20 DIAGNOSIS — Z23 Encounter for immunization: Secondary | ICD-10-CM

## 2018-07-20 DIAGNOSIS — R0602 Shortness of breath: Secondary | ICD-10-CM | POA: Diagnosis not present

## 2018-07-20 LAB — POCT URINALYSIS DIPSTICK
Bilirubin, UA: NEGATIVE
Blood, UA: NEGATIVE
Glucose, UA: NEGATIVE
Ketones, UA: NEGATIVE
Leukocytes, UA: NEGATIVE
Nitrite, UA: NEGATIVE
Protein, UA: NEGATIVE
Spec Grav, UA: 1.01 (ref 1.010–1.025)
Urobilinogen, UA: 0.2 E.U./dL
pH, UA: 6 (ref 5.0–8.0)

## 2018-07-20 NOTE — Progress Notes (Signed)
Date:  07/20/2018   Name:  Kimberly Walls   DOB:  12/20/1940   MRN:  [id number]   Chief Complaint: Hypertension and Breast Exam Kimberly Walls is a 78 y.o. female who presents today for her annual exam. She feels well. She reports exercising rarely. She reports she is sleeping fairly well.  Mammogram no longer needed Colonoscopy 06/2013 Immunizations are up to date  Diabetes  She presents for her follow-up diabetic visit. She has type 2 diabetes mellitus. Her disease course has been stable. Pertinent negatives for hypoglycemia include no dizziness, headaches, nervousness/anxiousness or tremors. Pertinent negatives for diabetes include no chest pain, no fatigue, no polydipsia and no polyuria. Diabetic complications include retinopathy. Current diabetic treatment includes oral agent (monotherapy). She is compliant with treatment all of the time. There is no compliance (meter does not work) with monitoring of blood glucose. An ACE inhibitor/angiotensin II receptor blocker is being taken.  Hypertension  This is a chronic problem. The problem is unchanged. The problem is controlled. Pertinent negatives include no chest pain, headaches, palpitations or shortness of breath. Hypertensive end-organ damage includes retinopathy.  Hyperlipidemia  This is a chronic problem. The problem is controlled. Pertinent negatives include no chest pain or shortness of breath. Current antihyperlipidemic treatment includes statins.  Depression         This is a chronic problem.  The problem has been resolved since onset.  Associated symptoms include no fatigue and no headaches.  Past treatments include SSRIs - Selective serotonin reuptake inhibitors. Gastroesophageal Reflux  She reports no abdominal pain, no chest pain, no coughing or no wheezing. This is a recurrent problem. The problem occurs rarely. Pertinent negatives include no fatigue. She has tried a PPI for the symptoms.   Lab Results  Component Value Date   HGBA1C 8.1 (H) 03/18/2018   Lab Results  Component Value Date   CREATININE 0.93 03/18/2018   BUN 20 03/18/2018   NA 137 03/18/2018   K 4.2 03/18/2018   CL 98 03/18/2018   CO2 23 03/18/2018   Lab Results  Component Value Date   CHOL 157 07/17/2017   HDL 42 07/17/2017   LDLCALC 81 07/17/2017   TRIG 169 (H) 07/17/2017   CHOLHDL 3.7 07/17/2017     Review of Systems  Constitutional: Negative for chills, fatigue and fever.  HENT: Negative for congestion, hearing loss, tinnitus, trouble swallowing and voice change.   Eyes: Negative for visual disturbance.  Respiratory: Negative for cough, chest tightness, shortness of breath and wheezing.   Cardiovascular: Negative for chest pain, palpitations and leg swelling.  Gastrointestinal: Negative for abdominal pain, constipation, diarrhea and vomiting.  Endocrine: Negative for polydipsia and polyuria.  Genitourinary: Negative for dysuria, frequency, genital sores, vaginal bleeding and vaginal discharge.  Musculoskeletal: Negative for arthralgias, gait problem and joint swelling.  Skin: Negative for color change and rash.  Neurological: Negative for dizziness, tremors, light-headedness and headaches.  Hematological: Negative for adenopathy. Does not bruise/bleed easily.  Psychiatric/Behavioral: Positive for depression. Negative for dysphoric mood and sleep disturbance. The patient is not nervous/anxious.     Patient Active Problem List   Diagnosis Date Noted   BMI 38.0-38.9,adult 06/29/2015   History of pulmonary embolus (PE) 06/29/2015   DM type 2, controlled, with complication ([place]) 78/24/2353   Arthritis, degenerative 12/05/2014   DDD (degenerative disc disease), lumbar 08/29/2014   Hyperlipidemia associated with type 2 diabetes mellitus ([place]) 08/29/2014   Essential (primary) hypertension 08/29/2014   Acid reflux 08/29/2014  Herpes 08/29/2014   Obstructive sleep apnea of adult 08/29/2014   Depression, major, recurrent,  in partial remission ([place]) 08/29/2014   Headache, tension-type 08/29/2014   Dermatophytosis of groin 08/29/2014   History of colon polyps 02/19/2008   Personal history of malignant neoplasm of breast 02/19/1991    Allergies  Allergen Reactions   Prochlorperazine    Pneumococcal Vaccine Rash   Tetanus Toxoids Rash    Past Surgical History:  Procedure Laterality Date   BUNIONECTOMY Bilateral    CARPAL TUNNEL RELEASE Left    LUMBAR LAMINECTOMY     MASTECTOMY MODIFIED RADICAL Bilateral 1993   PARTIAL HYSTERECTOMY     TOTAL KNEE ARTHROPLASTY Bilateral    TOTAL SHOULDER REPLACEMENT Bilateral     Social History   Tobacco Use   Smoking status: Never Smoker   Smokeless tobacco: Never Used   Tobacco comment: smoking cessation materials not required  Substance Use Topics   Alcohol use: Not Currently   Drug use: No     Medication list has been reviewed and updated.  Current Meds  Medication Sig   amLODipine (NORVASC) 2.5 MG tablet Take 1 tablet (2.5 mg total) by mouth daily.   ascorbic acid (VITAMIN C) 250 MG tablet Take 250 mg by mouth daily.   aspirin 81 MG tablet Take 1 tablet by mouth daily.   atorvastatin (LIPITOR) 10 MG tablet TAKE ONE TABLET BY MOUTH ONE TIME DAILY    BIOTIN 5000 PO Take 1 tablet by mouth daily.    Calcium Carb-Cholecalciferol (CALCIUM + D3) 600-200 MG-UNIT TABS Take 1 tablet by mouth daily.   Cinnamon 500 MG TABS Take 2 tablets by mouth daily.   Coenzyme Q10 (COQ10) 200 MG CAPS Take 1 capsule by mouth daily.   cyanocobalamin 1000 MCG tablet Take 1,000 mcg by mouth daily.   desloratadine (CLARINEX) 5 MG tablet TAKE ONE TABLET BY MOUTH ONE TIME DAILY    escitalopram (LEXAPRO) 10 MG tablet TAKE ONE TABLET BY MOUTH ONE TIME DAILY    hydrochlorothiazide (HYDRODIURIL) 25 MG tablet Take 1 tablet (25 mg total) by mouth daily.   lisinopril (PRINIVIL,ZESTRIL) 20 MG tablet TAKE ONE TABLET BY MOUTH ONE TIME DAILY    Magnesium 400  MG CAPS Take 1 tablet by mouth daily.    metFORMIN (GLUCOPHAGE-XR) 500 MG 24 hr tablet Take 1 tablet (500 mg total) by mouth 2 (two) times daily.   Multiple Vitamin (MULTIVITAMIN) capsule Take 1 capsule by mouth daily.   nystatin cream (MYCOSTATIN) APPLY TOPICALLY 2 TIMES DAILY   omeprazole (PRILOSEC) 20 MG capsule TAKE ONE CAPSULE BY MOUTH ONE TIME DAILY    oxaprozin (DAYPRO) 600 MG tablet TAKE ONE TABLET BY MOUTH TWICE DAILY     PHQ 2/9 Scores 07/20/2018 07/08/2018 06/04/2018 03/18/2018  PHQ - 2 Score 0 2 0 0  PHQ- 9 Score 0 2 0 0    BP Readings from Last 3 Encounters:  07/20/18 136/84  07/08/18 129/83  06/04/18 122/78    Physical Exam Vitals signs and nursing note reviewed.  Constitutional:      General: She is not in acute distress.    Appearance: She is well-developed.  HENT:     Head: Normocephalic and atraumatic.     Right Ear: Tympanic membrane and ear canal normal.     Left Ear: Tympanic membrane and ear canal normal.     Nose:     Right Sinus: No maxillary sinus tenderness.     Left Sinus: No maxillary sinus tenderness.  Mouth/Throat:     Pharynx: Uvula midline.  Eyes:     General: No scleral icterus.       Right eye: No discharge.        Left eye: No discharge.     Conjunctiva/sclera: Conjunctivae normal.  Neck:     Musculoskeletal: Normal range of motion. No erythema.     Thyroid: No thyromegaly.     Vascular: No carotid bruit.  Cardiovascular:     Rate and Rhythm: Normal rate and regular rhythm.     Pulses: Normal pulses.     Heart sounds: Normal heart sounds.  Pulmonary:     Effort: Pulmonary effort is normal. No respiratory distress.     Breath sounds: No wheezing.  Chest:     Comments: Bilateral breast reconstruction scars healed - no nodules, other skin change or tenderness Abdominal:     General: Bowel sounds are normal.     Palpations: Abdomen is soft.     Tenderness: There is no abdominal tenderness.  Musculoskeletal: Normal range of  motion.  Lymphadenopathy:     Cervical: No cervical adenopathy.  Skin:    General: Skin is warm and dry.     Findings: No rash.  Neurological:     Mental Status: She is alert and oriented to person, place, and time.     Cranial Nerves: No cranial nerve deficit.     Sensory: No sensory deficit.     Deep Tendon Reflexes: Reflexes are normal and symmetric.  Psychiatric:        Speech: Speech normal.        Behavior: Behavior normal.        Thought Content: Thought content normal.     Wt Readings from Last 3 Encounters:  07/20/18 217 lb (98.4 kg)  07/08/18 215 lb 6.4 oz (97.7 kg)  06/04/18 219 lb (99.3 kg)    BP 136/84    Pulse 75    Ht 5\' 3"  (1.6 m)    Wt 217 lb (98.4 kg)    SpO2 95%    BMI 38.44 kg/m   Assessment and Plan: 1. Essential (primary) hypertension controlled - CBC with Differential/Platelet - POCT urinalysis dipstick  2. DM type 2, controlled, with complication ([place]) Check labs and advise on additional medications - Comprehensive metabolic panel - Hemoglobin A1c  3. Hyperlipidemia associated with type 2 diabetes mellitus ([facility]) Continue statin therapy - Lipid panel  4. Depression, major, recurrent, in partial remission ([place]) Doing well - TSH  5. Gastroesophageal reflux disease, esophagitis presence not specified Controlled sx on PPI  6. Personal history of malignant neoplasm of breast No evidence of skin or chest wall recurrence - DG Chest 2 View; Future  7. Need for vaccination for pneumococcus - Pneumococcal polysaccharide vaccine 23-valent greater than or equal to 2yo subcutaneous/IM  8. Colon cancer screening Due for 5 yr follow up - Ambulatory referral to Gastroenterology  9. Encounter for screening for osteoporosis Pt will schedule at [facility] - order form given   Partially dictated using Dragon software. Any errors are unintentional.  Halina Maidens, MD [facility]  07/20/2018

## 2018-07-20 NOTE — Patient Instructions (Signed)

## 2018-07-21 ENCOUNTER — Encounter: Payer: Self-pay | Admitting: Internal Medicine

## 2018-07-21 LAB — CBC WITH DIFFERENTIAL/PLATELET
Basophils Absolute: 0.1 10*3/uL (ref 0.0–0.2)
Basos: 2 %
EOS (ABSOLUTE): 0.3 10*3/uL (ref 0.0–0.4)
Eos: 5 %
Hematocrit: 44.5 % (ref 34.0–46.6)
Hemoglobin: 14.9 g/dL (ref 11.1–15.9)
Immature Grans (Abs): 0 10*3/uL (ref 0.0–0.1)
Immature Granulocytes: 0 %
Lymphocytes Absolute: 2.4 10*3/uL (ref 0.7–3.1)
Lymphs: 34 %
MCH: 29.7 pg (ref 26.6–33.0)
MCHC: 33.5 g/dL (ref 31.5–35.7)
MCV: 89 fL (ref 79–97)
Monocytes Absolute: 0.7 10*3/uL (ref 0.1–0.9)
Monocytes: 10 %
Neutrophils Absolute: 3.4 10*3/uL (ref 1.4–7.0)
Neutrophils: 49 %
Platelets: 237 10*3/uL (ref 150–450)
RBC: 5.01 x10E6/uL (ref 3.77–5.28)
RDW: 13.9 % (ref 11.7–15.4)
WBC: 6.9 10*3/uL (ref 3.4–10.8)

## 2018-07-21 LAB — COMPREHENSIVE METABOLIC PANEL
ALT: 19 IU/L (ref 0–32)
AST: 26 IU/L (ref 0–40)
Albumin/Globulin Ratio: 1.9 (ref 1.2–2.2)
Albumin: 4.4 g/dL (ref 3.7–4.7)
Alkaline Phosphatase: 93 IU/L (ref 39–117)
BUN/Creatinine Ratio: 20 (ref 12–28)
BUN: 17 mg/dL (ref 8–27)
Bilirubin Total: 0.5 mg/dL (ref 0.0–1.2)
CO2: 26 mmol/L (ref 20–29)
Calcium: 9.2 mg/dL (ref 8.7–10.3)
Chloride: 98 mmol/L (ref 96–106)
Creatinine, Ser: 0.86 mg/dL (ref 0.57–1.00)
GFR calc Af Amer: 76 mL/min/{1.73_m2} (ref >59)
GFR calc non Af Amer: 66 mL/min/{1.73_m2} (ref >59)
Globulin, Total: 2.3 g/dL (ref 1.5–4.5)
Glucose: 122 mg/dL — ABNORMAL HIGH (ref 65–99)
Potassium: 4.4 mmol/L (ref 3.5–5.2)
Sodium: 140 mmol/L (ref 134–144)
Total Protein: 6.7 g/dL (ref 6.0–8.5)

## 2018-07-21 LAB — TSH: TSH: 1.86 u[IU]/mL (ref 0.450–4.500)

## 2018-07-21 LAB — LIPID PANEL
Chol/HDL Ratio: 3.9 ratio (ref 0.0–4.4)
Cholesterol, Total: 165 mg/dL (ref 100–199)
HDL: 42 mg/dL (ref >39)
LDL Calculated: 66 mg/dL (ref 0–99)
Triglycerides: 285 mg/dL — ABNORMAL HIGH (ref 0–149)
VLDL Cholesterol Cal: 57 mg/dL — ABNORMAL HIGH (ref 5–40)

## 2018-07-21 LAB — HEMOGLOBIN A1C
Est. average glucose Bld gHb Est-mCnc: 154 mg/dL
Hgb A1c MFr Bld: 7 % — ABNORMAL HIGH (ref 4.8–5.6)

## 2018-07-23 ENCOUNTER — Telehealth: Payer: Self-pay | Admitting: Internal Medicine

## 2018-07-23 DIAGNOSIS — E2839 Other primary ovarian failure: Secondary | ICD-10-CM | POA: Diagnosis not present

## 2018-07-23 DIAGNOSIS — Z13820 Encounter for screening for osteoporosis: Secondary | ICD-10-CM | POA: Diagnosis not present

## 2018-07-23 NOTE — Telephone Encounter (Signed)
Please let patient know that her bone density was normal.

## 2018-07-24 NOTE — Telephone Encounter (Signed)
Patient informed. 

## 2018-08-07 ENCOUNTER — Encounter: Payer: Self-pay | Admitting: Internal Medicine

## 2018-08-19 ENCOUNTER — Other Ambulatory Visit: Payer: Self-pay | Admitting: Internal Medicine

## 2018-08-19 DIAGNOSIS — E785 Hyperlipidemia, unspecified: Secondary | ICD-10-CM

## 2018-09-14 ENCOUNTER — Encounter: Payer: Self-pay | Admitting: Internal Medicine

## 2018-09-19 ENCOUNTER — Other Ambulatory Visit: Payer: Self-pay | Admitting: Internal Medicine

## 2018-09-29 DIAGNOSIS — H6122 Impacted cerumen, left ear: Secondary | ICD-10-CM | POA: Diagnosis not present

## 2018-11-09 DIAGNOSIS — L570 Actinic keratosis: Secondary | ICD-10-CM | POA: Diagnosis not present

## 2018-11-19 ENCOUNTER — Other Ambulatory Visit: Payer: Self-pay | Admitting: Internal Medicine

## 2018-11-19 DIAGNOSIS — I10 Essential (primary) hypertension: Secondary | ICD-10-CM

## 2018-11-20 ENCOUNTER — Encounter: Payer: Self-pay | Admitting: Internal Medicine

## 2018-11-20 ENCOUNTER — Ambulatory Visit (INDEPENDENT_AMBULATORY_CARE_PROVIDER_SITE_OTHER): Payer: Medicare Other | Admitting: Internal Medicine

## 2018-11-20 ENCOUNTER — Other Ambulatory Visit: Payer: Self-pay

## 2018-11-20 VITALS — BP 134/84 | HR 89 | Ht 63.0 in | Wt 216.0 lb

## 2018-11-20 DIAGNOSIS — E1169 Type 2 diabetes mellitus with other specified complication: Secondary | ICD-10-CM | POA: Diagnosis not present

## 2018-11-20 DIAGNOSIS — Z23 Encounter for immunization: Secondary | ICD-10-CM

## 2018-11-20 DIAGNOSIS — E118 Type 2 diabetes mellitus with unspecified complications: Secondary | ICD-10-CM | POA: Diagnosis not present

## 2018-11-20 DIAGNOSIS — I10 Essential (primary) hypertension: Secondary | ICD-10-CM

## 2018-11-20 DIAGNOSIS — E785 Hyperlipidemia, unspecified: Secondary | ICD-10-CM | POA: Diagnosis not present

## 2018-11-20 NOTE — Patient Instructions (Addendum)
Dr San Jetty - [facility]  Address: [address] [place], [place], [place] [address] Phone: [phone]

## 2018-11-20 NOTE — Progress Notes (Signed)
Date:  11/20/2018   Name:  Kimberly Walls   DOB:  1940-12-28   MRN:  [id number]   Chief Complaint: Hypertension (High dose flu shot.) and Diabetes  Hypertension This is a chronic problem. The problem is controlled. Pertinent negatives include no chest pain, headaches, palpitations or shortness of breath. Past treatments include ACE inhibitors, diuretics and calcium channel blockers. The current treatment provides significant improvement. There are no compliance problems.   Diabetes She presents for her follow-up diabetic visit. She has type 2 diabetes mellitus. Her disease course has been stable. Pertinent negatives for hypoglycemia include no headaches or tremors. Pertinent negatives for diabetes include no chest pain and no fatigue. Symptoms are stable. Current diabetic treatment includes oral agent (monotherapy). She is compliant with treatment all of the time. Her weight is stable. She is following a generally healthy diet. Her breakfast blood glucose is taken between 6-7 am. Her breakfast blood glucose range is generally 140-180 mg/dl. An ACE inhibitor/angiotensin II receptor blocker is being taken.   Lab Results  Component Value Date   HGBA1C 7.0 (H) 07/20/2018   Lab Results  Component Value Date   CREATININE 0.86 07/20/2018   BUN 17 07/20/2018   NA 140 07/20/2018   K 4.4 07/20/2018   CL 98 07/20/2018   CO2 26 07/20/2018   Lab Results  Component Value Date   CHOL 165 07/20/2018   HDL 42 07/20/2018   LDLCALC 66 07/20/2018   TRIG 285 (H) 07/20/2018   CHOLHDL 3.9 07/20/2018     Review of Systems  Constitutional: Negative for appetite change, fatigue, fever and unexpected weight change.  HENT: Negative for tinnitus and trouble swallowing.   Eyes: Negative for visual disturbance.  Respiratory: Negative for cough, chest tightness and shortness of breath.   Cardiovascular: Negative for chest pain, palpitations and leg swelling.  Gastrointestinal: Negative for abdominal pain.   Genitourinary: Negative for dysuria and hematuria.  Musculoskeletal: Positive for arthralgias.  Neurological: Negative for tremors, numbness and headaches.  Hematological: Negative for adenopathy.  Psychiatric/Behavioral: Positive for sleep disturbance. Negative for dysphoric mood.    Patient Active Problem List   Diagnosis Date Noted  . BMI 38.0-38.9,adult 06/29/2015  . History of pulmonary embolus (PE) 06/29/2015  . DM type 2, controlled, with complication ([place]) 123XX123  . Arthritis, degenerative 12/05/2014  . DDD (degenerative disc disease), lumbar 08/29/2014  . Hyperlipidemia associated with type 2 diabetes mellitus ([place]) 08/29/2014  . Essential (primary) hypertension 08/29/2014  . Acid reflux 08/29/2014  . Herpes 08/29/2014  . Obstructive sleep apnea of adult 08/29/2014  . Depression, major, recurrent, in partial remission ([place]) 08/29/2014  . Headache, tension-type 08/29/2014  . Dermatophytosis of groin 08/29/2014  . History of colon polyps 02/19/2008  . Personal history of malignant neoplasm of breast 02/19/1991    Allergies  Allergen Reactions  . Prochlorperazine   . Pneumococcal Vaccine Rash  . Tetanus Toxoids Rash    Past Surgical History:  Procedure Laterality Date  . BUNIONECTOMY Bilateral   . CARPAL TUNNEL RELEASE Left   . LUMBAR LAMINECTOMY    . MASTECTOMY MODIFIED RADICAL Bilateral 1993  . PARTIAL HYSTERECTOMY    . TOTAL KNEE ARTHROPLASTY Bilateral   . TOTAL SHOULDER REPLACEMENT Bilateral     Social History   Tobacco Use  . Smoking status: Never Smoker  . Smokeless tobacco: Never Used  . Tobacco comment: smoking cessation materials not required  Substance Use Topics  . Alcohol use: Not Currently  . Drug use:  No     Medication list has been reviewed and updated.  Current Meds  Medication Sig  . amLODipine (NORVASC) 2.5 MG tablet Take 1 tablet (2.5 mg total) by mouth daily.  Marland Kitchen aspirin 81 MG tablet Take 1 tablet by mouth daily.  Marland Kitchen  atorvastatin (LIPITOR) 10 MG tablet TAKE ONE TABLET BY MOUTH ONCE DAILY   . Calcium Carb-Cholecalciferol (CALCIUM + D3) 600-200 MG-UNIT TABS Take 1 tablet by mouth daily.  . Cinnamon 500 MG TABS Take 2 tablets by mouth daily.  . Coenzyme Q10 (COQ10) 200 MG CAPS Take 1 capsule by mouth daily.  . cyanocobalamin 1000 MCG tablet Take 1,000 mcg by mouth daily.  Marland Kitchen desloratadine (CLARINEX) 5 MG tablet TAKE ONE TABLET BY MOUTH ONCE DAILY   . escitalopram (LEXAPRO) 10 MG tablet TAKE ONE TABLET BY MOUTH ONE TIME DAILY   . hydrochlorothiazide (HYDRODIURIL) 25 MG tablet TAKE ONE TABLET BY MOUTH ONCE DAILY  (Patient taking differently: 12.5 mg. )  . lisinopril (ZESTRIL) 20 MG tablet TAKE ONE TABLET BY MOUTH ONCE DAILY   . metFORMIN (GLUCOPHAGE-XR) 500 MG 24 hr tablet TAKE ONE TABLET BY MOUTH TWICE DAILY   . Multiple Vitamin (MULTIVITAMIN) capsule Take 1 capsule by mouth daily.  Marland Kitchen nystatin cream (MYCOSTATIN) apply topically twice a day  . omeprazole (PRILOSEC) 20 MG capsule TAKE ONE CAPSULE BY MOUTH ONCE DAILY   . oxaprozin (DAYPRO) 600 MG tablet TAKE ONE TABLET BY MOUTH TWICE DAILY     PHQ 2/9 Scores 11/20/2018 07/20/2018 07/08/2018 06/04/2018  PHQ - 2 Score 0 0 2 0  PHQ- 9 Score - 0 2 0    BP Readings from Last 3 Encounters:  11/20/18 134/84  07/20/18 136/84  07/08/18 129/83    Physical Exam Vitals signs and nursing note reviewed.  Constitutional:      General: She is not in acute distress.    Appearance: She is well-developed.  HENT:     Head: Normocephalic and atraumatic.  Cardiovascular:     Rate and Rhythm: Normal rate and regular rhythm.     Pulses: Normal pulses.  Pulmonary:     Effort: Pulmonary effort is normal. No respiratory distress.  Musculoskeletal: Normal range of motion.  Skin:    General: Skin is warm and dry.     Findings: No rash.  Neurological:     Mental Status: She is alert and oriented to person, place, and time.  Psychiatric:        Behavior: Behavior normal.         Thought Content: Thought content normal.     Wt Readings from Last 3 Encounters:  11/20/18 216 lb (98 kg)  07/20/18 217 lb (98.4 kg)  07/08/18 215 lb 6.4 oz (97.7 kg)    BP 134/84   Pulse 89   Ht 5\' 3"  (1.6 m)   Wt 216 lb (98 kg)   SpO2 96%   BMI 38.26 kg/m   Assessment and Plan: 1. Essential (primary) hypertension Clinically stable exam with well controlled BP.   Tolerating medications, lisinopril, hctz and amlodpine 2.5 mg, without side effects at this time. Pt to continue current regimen and low sodium diet; benefits of regular exercise as able discussed.   2. DM type 2, controlled, with complication ([place]) Clinically stable by exam and report without s/s of hypoglycemia. Pt reports higher fasting and random glucoses over the past few months with no change in diet or activity DM complicated by HTN, lipids. Tolerating medications metformin 500 mg  bid well without side effects or other concerns. Will add therapy if needed after labs return. - Hemoglobin 123456 - Basic metabolic panel  3. Hyperlipidemia associated with type 2 diabetes mellitus ([place]) Tolerating high intensity statin medication without side effects at this time LDL is at goal of < 70 on current dose of atorvastatin 10 mg Continue same therapy without change at this time.   Partially dictated using Editor, commissioning. Any errors are unintentional.  Halina Maidens, MD [facility]  11/20/2018

## 2018-11-21 LAB — BASIC METABOLIC PANEL
BUN/Creatinine Ratio: 29 — ABNORMAL HIGH (ref 12–28)
BUN: 23 mg/dL (ref 8–27)
CO2: 23 mmol/L (ref 20–29)
Calcium: 9.6 mg/dL (ref 8.7–10.3)
Chloride: 98 mmol/L (ref 96–106)
Creatinine, Ser: 0.79 mg/dL (ref 0.57–1.00)
GFR calc Af Amer: 84 mL/min/{1.73_m2} (ref >59)
GFR calc non Af Amer: 72 mL/min/{1.73_m2} (ref >59)
Glucose: 136 mg/dL — ABNORMAL HIGH (ref 65–99)
Potassium: 4.2 mmol/L (ref 3.5–5.2)
Sodium: 138 mmol/L (ref 134–144)

## 2018-11-21 LAB — HEMOGLOBIN A1C
Est. average glucose Bld gHb Est-mCnc: 154 mg/dL
Hgb A1c MFr Bld: 7 % — ABNORMAL HIGH (ref 4.8–5.6)

## 2019-01-19 ENCOUNTER — Other Ambulatory Visit: Payer: Self-pay | Admitting: Internal Medicine

## 2019-01-19 DIAGNOSIS — I10 Essential (primary) hypertension: Secondary | ICD-10-CM

## 2019-02-17 ENCOUNTER — Other Ambulatory Visit: Payer: Self-pay | Admitting: Internal Medicine

## 2019-02-17 DIAGNOSIS — F3341 Major depressive disorder, recurrent, in partial remission: Secondary | ICD-10-CM

## 2019-02-17 DIAGNOSIS — E785 Hyperlipidemia, unspecified: Secondary | ICD-10-CM

## 2019-02-20 DIAGNOSIS — S6991XA Unspecified injury of right wrist, hand and finger(s), initial encounter: Secondary | ICD-10-CM | POA: Diagnosis not present

## 2019-02-20 DIAGNOSIS — S59902A Unspecified injury of left elbow, initial encounter: Secondary | ICD-10-CM | POA: Diagnosis not present

## 2019-02-23 ENCOUNTER — Ambulatory Visit (INDEPENDENT_AMBULATORY_CARE_PROVIDER_SITE_OTHER): Payer: Medicare Other | Admitting: Internal Medicine

## 2019-02-23 ENCOUNTER — Other Ambulatory Visit: Payer: Self-pay

## 2019-02-23 ENCOUNTER — Encounter: Payer: Self-pay | Admitting: Internal Medicine

## 2019-02-23 VITALS — BP 136/86 | HR 85 | Ht 63.0 in | Wt 214.0 lb

## 2019-02-23 DIAGNOSIS — L03114 Cellulitis of left upper limb: Secondary | ICD-10-CM | POA: Diagnosis not present

## 2019-02-23 MED ORDER — AMOXICILLIN-POT CLAVULANATE 875-125 MG PO TABS: 1.0000 | ORAL_TABLET | Freq: Two times a day (BID) | ORAL | 0 refills | Status: AC

## 2019-02-23 NOTE — Progress Notes (Signed)
Date:  02/23/2019   Name:  Kimberly Walls   DOB:  [birth year]   MRN:  [id number]   Chief Complaint: Wound Infection (Left arm wound. Seen [facility] after a fall in the rain the otherday outside on the concrete. Hit her left knee, right thumb, and left elbow. Wants to be sure her arm is not getting infected. Receieved a XR of thumb and elbow and it is not fractured. ) and Hypertension (Follow up.)  Wound Check She was originally treated 3 to 5 days ago. Prior ED Treatment: no treatment in UC - she has been using TAO and dressing. Maximum temperature: no fever. There has been clear discharge from the wound. There is no redness present. The swelling has not changed. The pain has not changed. There is difficulty moving the extremity or digit due to pain.   She [facility]/20/2019 [facility].  She was able to get up on her own and get to [facility] where she was able to clean up. She went home but then the next day went to UC to be sure there were no fractures of her right thumb, left elbow and knees. Tdap done 2018  Lab Results  Component Value Date   CREATININE 0.79 11/20/2018   BUN 23 11/20/2018   NA 138 11/20/2018   K 4.2 11/20/2018   CL 98 11/20/2018   CO2 23 11/20/2018   Lab Results  Component Value Date   CHOL 165 07/20/2018   HDL 42 07/20/2018   LDLCALC 66 07/20/2018   TRIG 285 (H) 07/20/2018   CHOLHDL 3.9 07/20/2018   Lab Results  Component Value Date   TSH 1.860 07/20/2018   Lab Results  Component Value Date   HGBA1C 7.0 (H) 11/20/2018     Review of Systems  Constitutional: Negative for chills, fatigue and fever.  Respiratory: Negative for chest tightness and shortness of breath.   Cardiovascular: Negative for chest pain, palpitations and leg swelling.  Musculoskeletal: Positive for arthralgias.  Skin: Positive for wound.  Neurological: Negative for dizziness and headaches.    Patient Active Problem List   Diagnosis Date Noted  . BMI 38.0-38.9,adult 06/29/2015  .  History of pulmonary embolus (PE) 06/29/2015  . DM type 2, controlled, with complication ([place]) 123XX123  . Arthritis, degenerative 12/05/2014  . DDD (degenerative disc disease), lumbar 08/29/2014  . Hyperlipidemia associated with type 2 diabetes mellitus ([place]) 08/29/2014  . Essential (primary) hypertension 08/29/2014  . Acid reflux 08/29/2014  . Herpes 08/29/2014  . Obstructive sleep apnea of adult 08/29/2014  . Depression, major, recurrent, in partial remission ([place]) 08/29/2014  . Headache, tension-type 08/29/2014  . Dermatophytosis of groin 08/29/2014  . History of colon polyps 02/19/2008  . Personal history of malignant neoplasm of breast 02/19/1991    Allergies  Allergen Reactions  . Prochlorperazine   . Pneumococcal Vaccine Rash  . Tetanus Toxoids Rash    Past Surgical History:  Procedure Laterality Date  . BUNIONECTOMY Bilateral   . CARPAL TUNNEL RELEASE Left   . LUMBAR LAMINECTOMY    . MASTECTOMY MODIFIED RADICAL Bilateral 1993  . PARTIAL HYSTERECTOMY    . TOTAL KNEE ARTHROPLASTY Bilateral   . TOTAL SHOULDER REPLACEMENT Bilateral     Social History   Tobacco Use  . Smoking status: Never Smoker  . Smokeless tobacco: Never Used  . Tobacco comment: smoking cessation materials not required  Substance Use Topics  . Alcohol use: Not Currently  . Drug use: No  Medication list has been reviewed and updated.  Current Meds  Medication Sig  . amLODipine (NORVASC) 2.5 MG tablet TAKE ONE TABLET BY MOUTH ONCE DAILY   . aspirin 81 MG tablet Take 1 tablet by mouth daily.  Marland Kitchen atorvastatin (LIPITOR) 10 MG tablet TAKE ONE TABLET BY MOUTH ONCE DAILY   . Calcium Carb-Cholecalciferol (CALCIUM + D3) 600-200 MG-UNIT TABS Take 1 tablet by mouth daily.  . Cinnamon 500 MG TABS Take 2 tablets by mouth daily.  . Coenzyme Q10 (COQ10) 200 MG CAPS Take 1 capsule by mouth daily.  . cyanocobalamin 1000 MCG tablet Take 1,000 mcg by mouth daily.  Marland Kitchen desloratadine (CLARINEX) 5 MG tablet  TAKE ONE TABLET BY MOUTH ONCE DAILY   . escitalopram (LEXAPRO) 10 MG tablet TAKE ONE TABLET BY MOUTH ONCE DAILY   . hydrochlorothiazide (HYDRODIURIL) 25 MG tablet TAKE ONE TABLET BY MOUTH ONCE DAILY  (Patient taking differently: 12.5 mg. )  . lisinopril (ZESTRIL) 20 MG tablet TAKE ONE TABLET BY MOUTH ONCE DAILY   . metFORMIN (GLUCOPHAGE-XR) 500 MG 24 hr tablet TAKE ONE TABLET BY MOUTH TWICE DAILY   . Multiple Vitamin (MULTIVITAMIN) capsule Take 1 capsule by mouth daily.  Marland Kitchen nystatin cream (MYCOSTATIN) Apply topically 2 (two) times daily. To rash under breasts and abdomen  . omeprazole (PRILOSEC) 20 MG capsule TAKE ONE CAPSULE BY MOUTH ONCE DAILY   . oxaprozin (DAYPRO) 600 MG tablet TAKE ONE TABLET BY MOUTH TWICE DAILY     PHQ 2/9 Scores 02/23/2019 11/20/2018 07/20/2018 07/08/2018  PHQ - 2 Score 0 0 0 2  PHQ- 9 Score 0 - 0 2    BP Readings from Last 3 Encounters:  02/23/19 136/86  11/20/18 134/84  07/20/18 136/84    Physical Exam Vitals and nursing note reviewed.  Constitutional:      General: She is not in acute distress.    Appearance: She is well-developed.  HENT:     Head: Normocephalic and atraumatic.  Cardiovascular:     Rate and Rhythm: Normal rate and regular rhythm.     Pulses: Normal pulses.  Pulmonary:     Effort: Pulmonary effort is normal. No respiratory distress.     Breath sounds: No wheezing or rhonchi.  Musculoskeletal:        General: Normal range of motion.       Arms:     Comments: 2x1.5 superficial abrasion.  Surrounding tissue slightly puffy and red.  No bleeding, drainage or odor. Mild lymphedema - chronic since mastectomy  Skin:    General: Skin is warm and dry.     Findings: No rash.  Neurological:     Mental Status: She is alert and oriented to person, place, and time.  Psychiatric:        Behavior: Behavior normal.        Thought Content: Thought content normal.     Wt Readings from Last 3 Encounters:  02/23/19 214 lb (97.1 kg)  11/20/18 216 lb  (98 kg)  07/20/18 217 lb (98.4 kg)    BP 136/86   Pulse 85   Ht 5\' 3"  (1.6 m)   Wt 214 lb (97.1 kg)   SpO2 97%   BMI 37.91 kg/m   Assessment and Plan: 1. Cellulitis of left upper extremity Continue local care with TAO and non stick dressing Start antibiotics for early cellulitis TDAP is up to date Take nsaids for pain - pt has daypro bid - amoxicillin-clavulanate (AUGMENTIN) 875-125 MG tablet; Take 1 tablet  by mouth 2 (two) times daily for 10 days.  Dispense: 20 tablet; Refill: 0   Partially dictated using Editor, commissioning. Any errors are unintentional.  Halina Maidens, MD [facility]  02/23/2019

## 2019-03-09 ENCOUNTER — Telehealth: Payer: Self-pay

## 2019-03-09 ENCOUNTER — Other Ambulatory Visit: Payer: Self-pay | Admitting: Internal Medicine

## 2019-03-09 DIAGNOSIS — F411 Generalized anxiety disorder: Secondary | ICD-10-CM

## 2019-03-09 MED ORDER — DIAZEPAM 2 MG PO TABS: 2.0000 mg | ORAL_TABLET | Freq: Three times a day (TID) | ORAL | 0 refills | Status: DC | PRN

## 2019-03-09 NOTE — Telephone Encounter (Signed)
I sent in a few low dose valium for her to use AS NEEDED only.  She must not drive within 8 hours of taking these.

## 2019-03-09 NOTE — Telephone Encounter (Signed)
Pt *brother passed ( not mother )

## 2019-03-09 NOTE — Telephone Encounter (Signed)
Pt called to request being prescribed something to help "calm her nerves." Her mother passed away last week and she is having to deal with a lot at this time.   Would you like to see pt in person or do a VV?  Please Advise.

## 2019-03-09 NOTE — Telephone Encounter (Signed)
Patient informed. 

## 2019-03-22 DIAGNOSIS — Z96612 Presence of left artificial shoulder joint: Secondary | ICD-10-CM | POA: Diagnosis not present

## 2019-03-22 DIAGNOSIS — M25512 Pain in left shoulder: Secondary | ICD-10-CM | POA: Diagnosis not present

## 2019-03-22 DIAGNOSIS — M25511 Pain in right shoulder: Secondary | ICD-10-CM | POA: Diagnosis not present

## 2019-03-24 DIAGNOSIS — Z23 Encounter for immunization: Secondary | ICD-10-CM | POA: Diagnosis not present

## 2019-03-26 DIAGNOSIS — Z96612 Presence of left artificial shoulder joint: Secondary | ICD-10-CM | POA: Diagnosis not present

## 2019-03-26 DIAGNOSIS — M25512 Pain in left shoulder: Secondary | ICD-10-CM | POA: Diagnosis not present

## 2019-03-26 DIAGNOSIS — M75122 Complete rotator cuff tear or rupture of left shoulder, not specified as traumatic: Secondary | ICD-10-CM | POA: Diagnosis not present

## 2019-03-29 ENCOUNTER — Encounter: Payer: Self-pay | Admitting: Internal Medicine

## 2019-03-29 ENCOUNTER — Other Ambulatory Visit: Payer: Self-pay

## 2019-03-29 ENCOUNTER — Ambulatory Visit (INDEPENDENT_AMBULATORY_CARE_PROVIDER_SITE_OTHER): Payer: Medicare Other | Admitting: Internal Medicine

## 2019-03-29 VITALS — BP 146/85 | HR 87 | Temp 97.8°F | Ht 63.0 in | Wt 208.0 lb

## 2019-03-29 DIAGNOSIS — Z96612 Presence of left artificial shoulder joint: Secondary | ICD-10-CM | POA: Diagnosis not present

## 2019-03-29 DIAGNOSIS — F4321 Adjustment disorder with depressed mood: Secondary | ICD-10-CM | POA: Diagnosis not present

## 2019-03-29 DIAGNOSIS — E118 Type 2 diabetes mellitus with unspecified complications: Secondary | ICD-10-CM | POA: Diagnosis not present

## 2019-03-29 DIAGNOSIS — I10 Essential (primary) hypertension: Secondary | ICD-10-CM | POA: Diagnosis not present

## 2019-03-29 NOTE — Progress Notes (Signed)
Date:  03/29/2019   Name:  Kimberly Walls   DOB:  1940/09/30   MRN:  [id number]   Chief Complaint: Diabetes (4 month follow up.) and Hypertension (F/up.) She has had one Covid vaccine and is getting the second later this month.  Two days before that she is scheduled to get her Hep A booster.  I advised that she delay the Hep A for about one month after the second Covid vaccine.  Diabetes She presents for her follow-up diabetic visit. She has type 2 diabetes mellitus. Her disease course has been stable. Hypoglycemia symptoms include nervousness/anxiousness. Pertinent negatives for hypoglycemia include no dizziness or headaches. Pertinent negatives for diabetes include no chest pain and no fatigue. Current diabetic treatment includes oral agent (monotherapy) (metformin bid). Her weight is decreasing steadily. She monitors blood glucose at home 1-2 x per day. Her breakfast blood glucose is taken between 6-7 am. Her breakfast blood glucose range is generally 140-180 mg/dl. An ACE inhibitor/angiotensin II receptor blocker is being taken.  Hypertension This is a chronic problem. The problem is controlled. Pertinent negatives include no chest pain, headaches, palpitations or shortness of breath. Past treatments include ACE inhibitors, calcium channel blockers and diuretics. The current treatment provides significant improvement.  Anxiety/sleep disturbance - her younger brother passed away about three weeks ago in [place].  She was very close to him and is having some trouble sleeping.  She was given a few Valium which she is taking but cutting in half to help her sleep.  She is gradually feeling better.  Lab Results  Component Value Date   CREATININE 0.79 11/20/2018   BUN 23 11/20/2018   NA 138 11/20/2018   K 4.2 11/20/2018   CL 98 11/20/2018   CO2 23 11/20/2018   Lab Results  Component Value Date   CHOL 165 07/20/2018   HDL 42 07/20/2018   LDLCALC 66 07/20/2018   TRIG 285 (H) 07/20/2018   CHOLHDL 3.9 07/20/2018   Lab Results  Component Value Date   TSH 1.860 07/20/2018   Lab Results  Component Value Date   HGBA1C 7.0 (H) 11/20/2018     Review of Systems  Constitutional: Negative for chills, fatigue and fever.  HENT: Negative for trouble swallowing.   Eyes: Negative for visual disturbance.  Respiratory: Negative for cough and shortness of breath.   Cardiovascular: Negative for chest pain, palpitations and leg swelling.  Musculoskeletal: Positive for arthralgias.  Neurological: Negative for dizziness and headaches.  Psychiatric/Behavioral: Positive for dysphoric mood. Negative for sleep disturbance. The patient is nervous/anxious.     Patient Active Problem List   Diagnosis Date Noted  . BMI 38.0-38.9,adult 06/29/2015  . History of pulmonary embolus (PE) 06/29/2015  . DM type 2, controlled, with complication ([place]) 123XX123  . Arthritis, degenerative 12/05/2014  . DDD (degenerative disc disease), lumbar 08/29/2014  . Hyperlipidemia associated with type 2 diabetes mellitus ([place]) 08/29/2014  . Essential (primary) hypertension 08/29/2014  . Acid reflux 08/29/2014  . Herpes 08/29/2014  . Obstructive sleep apnea of adult 08/29/2014  . Depression, major, recurrent, in partial remission ([place]) 08/29/2014  . Headache, tension-type 08/29/2014  . Dermatophytosis of groin 08/29/2014  . History of colon polyps 02/19/2008  . Personal history of malignant neoplasm of breast 02/19/1991    Allergies  Allergen Reactions  . Prochlorperazine   . Pneumococcal Vaccine Rash  . Tetanus Toxoids Rash    Past Surgical History:  Procedure Laterality Date  . BUNIONECTOMY Bilateral   . CARPAL  TUNNEL RELEASE Left   . LUMBAR LAMINECTOMY    . MASTECTOMY MODIFIED RADICAL Bilateral 1993  . PARTIAL HYSTERECTOMY    . TOTAL KNEE ARTHROPLASTY Bilateral   . TOTAL SHOULDER REPLACEMENT Bilateral     Social History   Tobacco Use  . Smoking status: Never Smoker  . Smokeless  tobacco: Never Used  . Tobacco comment: smoking cessation materials not required  Substance Use Topics  . Alcohol use: Not Currently  . Drug use: No     Medication list has been reviewed and updated.  Current Meds  Medication Sig  . amLODipine (NORVASC) 2.5 MG tablet TAKE ONE TABLET BY MOUTH ONCE DAILY   . aspirin 81 MG tablet Take 1 tablet by mouth daily.  Marland Kitchen atorvastatin (LIPITOR) 10 MG tablet TAKE ONE TABLET BY MOUTH ONCE DAILY   . Calcium Carb-Cholecalciferol (CALCIUM + D3) 600-200 MG-UNIT TABS Take 1 tablet by mouth daily.  . Cinnamon 500 MG TABS Take 2 tablets by mouth daily.  . Coenzyme Q10 (COQ10) 200 MG CAPS Take 1 capsule by mouth daily.  Marland Kitchen desloratadine (CLARINEX) 5 MG tablet TAKE ONE TABLET BY MOUTH ONCE DAILY   . diazepam (VALIUM) 2 MG tablet Take 1 tablet (2 mg total) by mouth every 8 (eight) hours as needed for anxiety.  Marland Kitchen escitalopram (LEXAPRO) 10 MG tablet TAKE ONE TABLET BY MOUTH ONCE DAILY   . hydrochlorothiazide (HYDRODIURIL) 25 MG tablet TAKE ONE TABLET BY MOUTH ONCE DAILY  (Patient taking differently: 12.5 mg. )  . lisinopril (ZESTRIL) 20 MG tablet TAKE ONE TABLET BY MOUTH ONCE DAILY   . metFORMIN (GLUCOPHAGE-XR) 500 MG 24 hr tablet TAKE ONE TABLET BY MOUTH TWICE DAILY   . Multiple Vitamin (MULTIVITAMIN) capsule Take 1 capsule by mouth daily.  Marland Kitchen nystatin cream (MYCOSTATIN) Apply topically 2 (two) times daily. To rash under breasts and abdomen  . omeprazole (PRILOSEC) 20 MG capsule TAKE ONE CAPSULE BY MOUTH ONCE DAILY   . oxaprozin (DAYPRO) 600 MG tablet TAKE ONE TABLET BY MOUTH TWICE DAILY     PHQ 2/9 Scores 03/29/2019 02/23/2019 11/20/2018 07/20/2018  PHQ - 2 Score 4 0 0 0  PHQ- 9 Score 4 0 - 0    BP Readings from Last 3 Encounters:  03/29/19 (!) 146/85  02/23/19 136/86  11/20/18 134/84    Physical Exam Vitals and nursing note reviewed.  Constitutional:      General: She is not in acute distress.    Appearance: She is well-developed.  HENT:     Head:  Normocephalic and atraumatic.  Cardiovascular:     Rate and Rhythm: Normal rate and regular rhythm.     Pulses: Normal pulses.     Heart sounds: No murmur.  Pulmonary:     Effort: Pulmonary effort is normal. No respiratory distress.  Musculoskeletal:     Right shoulder: Tenderness present. Decreased range of motion.     Right lower leg: No edema.     Left lower leg: No edema.  Lymphadenopathy:     Cervical: No cervical adenopathy.  Skin:    General: Skin is warm and dry.     Capillary Refill: Capillary refill takes less than 2 seconds.     Findings: No rash.  Neurological:     General: No focal deficit present.     Mental Status: She is alert and oriented to person, place, and time.  Psychiatric:        Behavior: Behavior normal.        Thought  Content: Thought content normal.     Wt Readings from Last 3 Encounters:  03/29/19 208 lb (94.3 kg)  02/23/19 214 lb (97.1 kg)  11/20/18 216 lb (98 kg)    BP (!) 146/85   Pulse 87   Temp 97.8 F (36.6 C) (Oral)   Ht 5\' 3"  (1.6 m)   Wt 208 lb (94.3 kg)   SpO2 96%   BMI 36.85 kg/m   Assessment and Plan: 1. DM type 2, controlled, with complication ([place]) Clinically stable by exam and report without s/s of hypoglycemia. DM complicated by HTN. Tolerating medications metformin bid well without side effects but AM glucoses have been running close to 200 and afternoon readings around 130. Will continue same dose and change if A1C is much higher. - Hemoglobin A1c  2. Essential (primary) hypertension Clinically stable exam with well controlled BP on amlodipine, hctz and lisinopril. Tolerating medications without side effects at this time. Pt to continue current regimen and low sodium diet; benefits of regular exercise as able discussed.  3. Grief reaction Slowing improving - can continue Valium at bedtime as needed Follow up if needed   Partially dictated using [place]. Any errors are unintentional.  Halina Maidens,  MD [facility]  03/29/2019

## 2019-03-30 LAB — HEMOGLOBIN A1C
Est. average glucose Bld gHb Est-mCnc: 154 mg/dL
Hgb A1c MFr Bld: 7 % — ABNORMAL HIGH (ref 4.8–5.6)

## 2019-03-31 DIAGNOSIS — H019 Unspecified inflammation of eyelid: Secondary | ICD-10-CM | POA: Diagnosis not present

## 2019-03-31 DIAGNOSIS — D3132 Benign neoplasm of left choroid: Secondary | ICD-10-CM | POA: Diagnosis not present

## 2019-03-31 DIAGNOSIS — E119 Type 2 diabetes mellitus without complications: Secondary | ICD-10-CM | POA: Diagnosis not present

## 2019-03-31 DIAGNOSIS — H527 Unspecified disorder of refraction: Secondary | ICD-10-CM | POA: Diagnosis not present

## 2019-03-31 DIAGNOSIS — H2513 Age-related nuclear cataract, bilateral: Secondary | ICD-10-CM | POA: Diagnosis not present

## 2019-03-31 LAB — HM DIABETES EYE EXAM

## 2019-04-07 DIAGNOSIS — Z96651 Presence of right artificial knee joint: Secondary | ICD-10-CM | POA: Diagnosis not present

## 2019-04-07 DIAGNOSIS — Z96653 Presence of artificial knee joint, bilateral: Secondary | ICD-10-CM | POA: Diagnosis not present

## 2019-04-07 DIAGNOSIS — Z96652 Presence of left artificial knee joint: Secondary | ICD-10-CM | POA: Diagnosis not present

## 2019-04-13 DIAGNOSIS — M25562 Pain in left knee: Secondary | ICD-10-CM | POA: Diagnosis not present

## 2019-04-13 DIAGNOSIS — M6281 Muscle weakness (generalized): Secondary | ICD-10-CM | POA: Diagnosis not present

## 2019-04-13 DIAGNOSIS — M25561 Pain in right knee: Secondary | ICD-10-CM | POA: Diagnosis not present

## 2019-04-14 DIAGNOSIS — Z23 Encounter for immunization: Secondary | ICD-10-CM | POA: Diagnosis not present

## 2019-04-19 ENCOUNTER — Other Ambulatory Visit: Payer: Self-pay | Admitting: Internal Medicine

## 2019-04-19 DIAGNOSIS — I10 Essential (primary) hypertension: Secondary | ICD-10-CM

## 2019-04-20 ENCOUNTER — Telehealth: Payer: Self-pay

## 2019-04-20 NOTE — Telephone Encounter (Signed)
Pt said a few days after getting her second covid vaccine she started having chills and then sweats at night.   Told her this could be her body building immunity to the vaccine. Told her to give it time and if it doesn't get better let us know.  CM

## 2019-05-17 ENCOUNTER — Other Ambulatory Visit: Payer: Self-pay | Admitting: Internal Medicine

## 2019-06-30 IMAGING — CR DG CHEST 2V
2 series · 3 of 3 positions shown · non-contrast
Comparison: Two-view chest x-ray 07/01/2016

CLINICAL DATA: Breast cancer. Shortness of breath. Right-sided
chest pain.

EXAM:
CHEST - 2 VIEW

[Series 1: chest pa · 0.14mm/px · 2 of 2 slices shown]
[im 1/2]
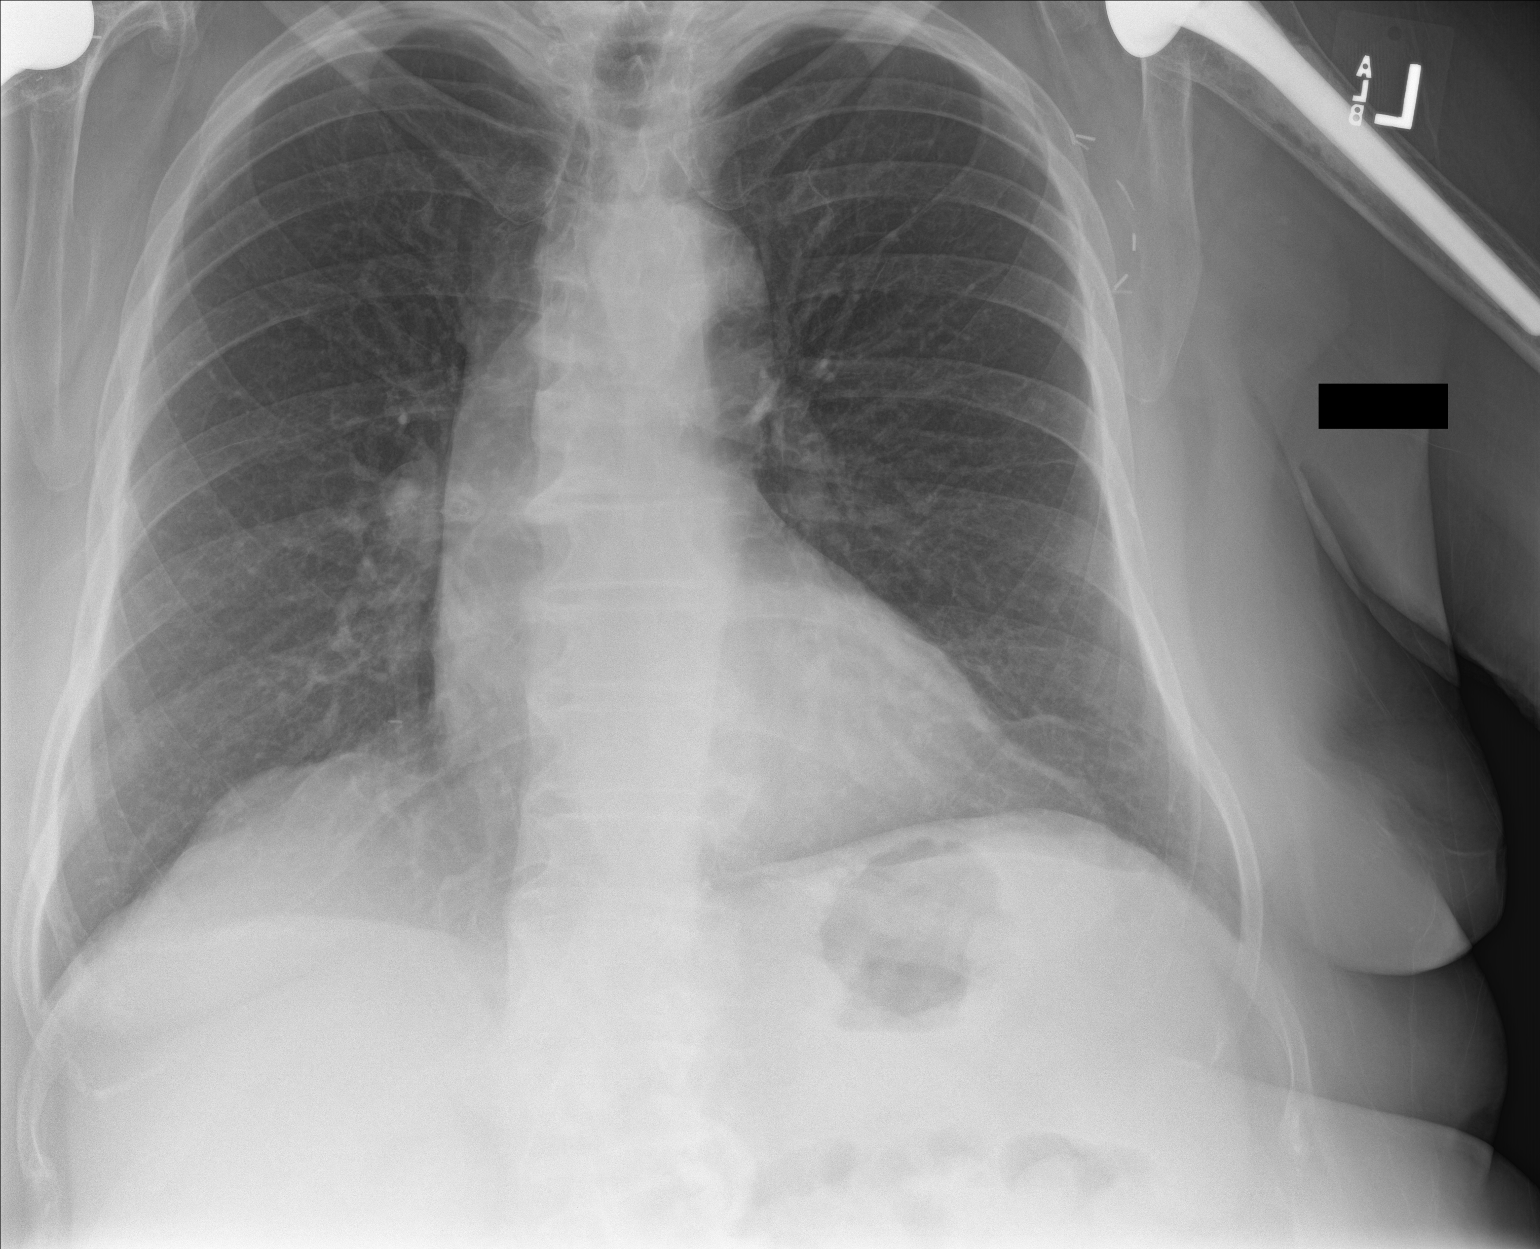
[im 2/2]
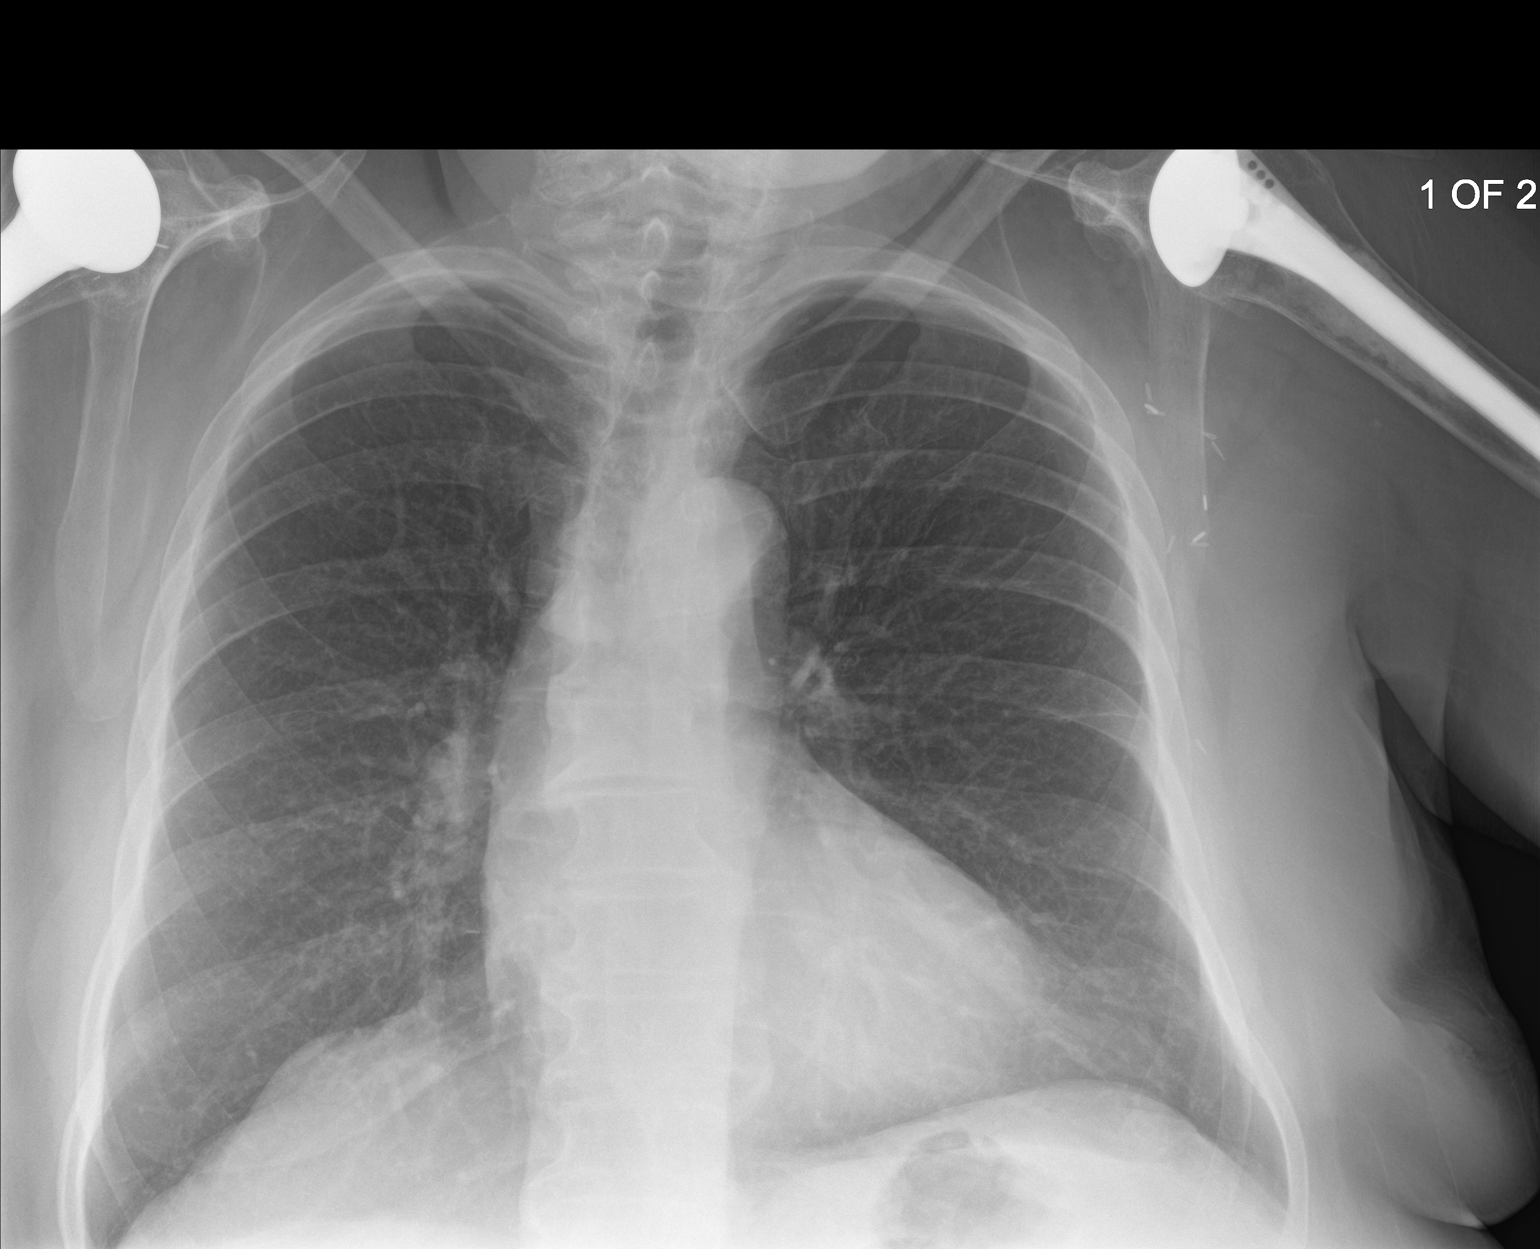

[chest lat]
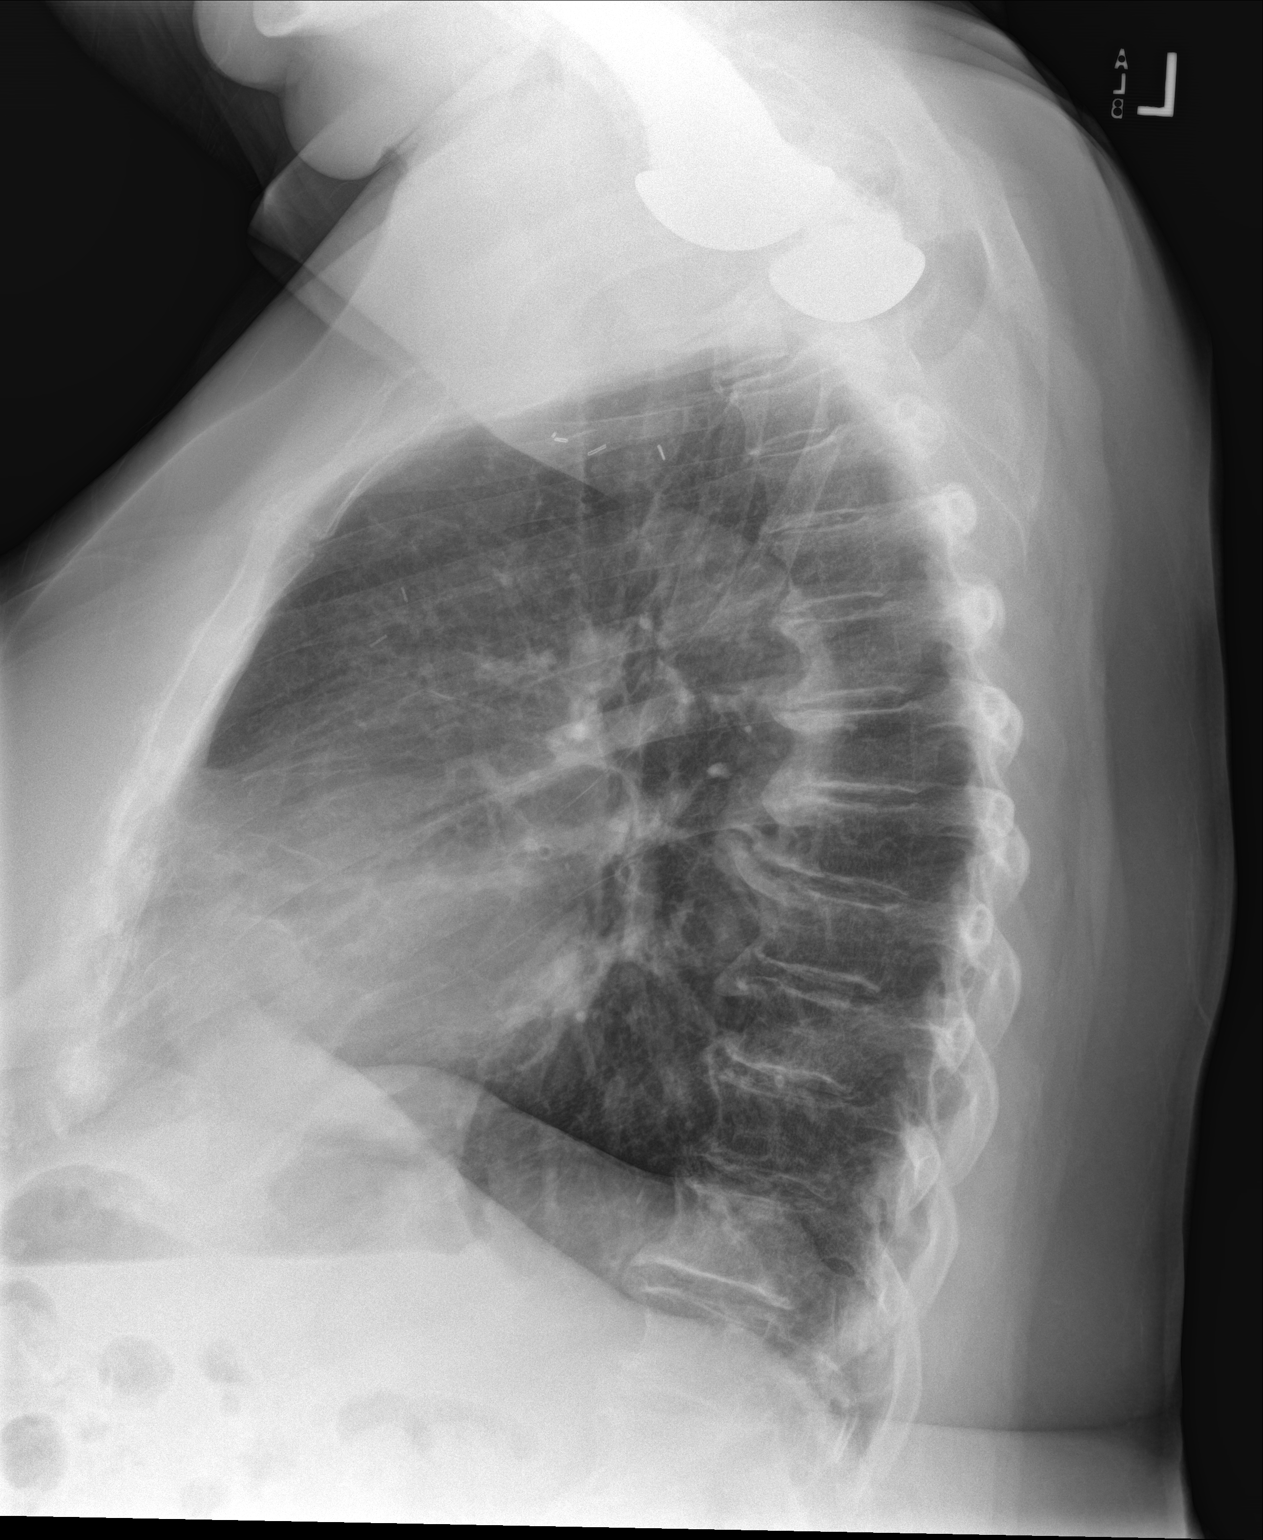

[3 of 3 positions shown; findings below may reference images not displayed]

FINDINGS: The heart size is normal. Lungs are clear. There is no edema or
effusion. No focal airspace disease is present. Multilevel endplate
degenerative changes are noted in the thoracolumbar spine.
IMPRESSION: No active cardiopulmonary disease.

## 2019-07-06 ENCOUNTER — Telehealth: Payer: Self-pay | Admitting: Internal Medicine

## 2019-07-06 NOTE — Telephone Encounter (Signed)
Left message for patient to call back and schedule Medicare Annual Wellness Visit (AWV) either virtually/audio only or in office. Whichever the patients preference is.  Last AWV 07/08/18; please schedule 07/09/19 OR AFTER with [facility]-Nurse Health Advisor.

## 2019-07-12 ENCOUNTER — Ambulatory Visit: Payer: Medicare Other | Admitting: Internal Medicine

## 2019-07-14 ENCOUNTER — Ambulatory Visit (INDEPENDENT_AMBULATORY_CARE_PROVIDER_SITE_OTHER): Payer: Medicare Other

## 2019-07-14 DIAGNOSIS — Z0000 Encounter for general adult medical examination without abnormal findings: Secondary | ICD-10-CM | POA: Diagnosis not present

## 2019-07-14 NOTE — Progress Notes (Signed)
Subjective:   Kimberly Walls is a 79 y.o. female who presents for Medicare Annual (Subsequent) preventive examination.  Virtual Visit via Telephone Note  I connected with  Kimberly Walls on 07/14/19 at  3:20 PM EDT by telephone and verified that I am speaking with the correct person using two identifiers.  Medicare Annual Wellness visit completed telephonically due to Covid-19 pandemic.   Location: Patient: home Provider: office   I discussed the limitations, risks, security and privacy concerns of performing an evaluation and management service by telephone and the availability of in person appointments. The patient expressed understanding and agreed to proceed.  Unable to perform video visit due to patient does not have video capability.   Some vital signs may be absent or patient reported.   Clemetine Marker, LPN    Review of Systems:   Cardiac Risk Factors include: advanced age (>66men, >59 women);diabetes mellitus;dyslipidemia;hypertension;obesity (BMI >30kg/m2)     Objective:     Vitals: There were no vitals taken for this visit.  There is no height or weight on file to calculate BMI.  Advanced Directives 07/14/2019 07/07/2017 06/29/2015 03/06/2015  Does Patient Have a Medical Advance Directive? No No No No  Would patient like information on creating a medical advance directive? No - Patient declined Yes (MAU/Ambulatory/Procedural Areas - Information given) No - patient declined information No - patient declined information    Tobacco Social History   Tobacco Use  Smoking Status Never Smoker  Smokeless Tobacco Never Used  Tobacco Comment   smoking cessation materials not required     Counseling given: Not Answered Comment: smoking cessation materials not required   Clinical Intake:  Pre-visit preparation completed: Yes  Pain : 0-10 Pain Score: 6  Pain Type: Chronic pain Pain Location: Knee Pain Orientation: Right, Left Pain Descriptors / Indicators: Aching,  Sore, Discomfort Pain Onset: More than a month ago Pain Frequency: Constant     Nutritional Risks: None Diabetes: Yes CBG done?: No Did pt. bring in CBG monitor from home?: No   Nutrition Risk Assessment:  Has the patient had any N/V/D within the last 2 months?  No  Does the patient have any non-healing wounds?  No  Has the patient had any unintentional weight loss or weight gain?  No   Diabetes:  Is the patient diabetic?  Yes  If diabetic, was a CBG obtained today?  No  Did the patient bring in their glucometer from home?  No  How often do you monitor your CBG's? A few times per month.   Financial Strains and Diabetes Management:  Are you having any financial strains with the device, your supplies or your medication? No .  Does the patient want to be seen by Chronic Care Management for management of their diabetes?  No  Would the patient like to be referred to a Nutritionist or for Diabetic Management?  No   Diabetic Exams:  Diabetic Eye Exam: Completed 03/31/19 negative retinopathy.   Diabetic Foot Exam: Completed 07/20/18.   How often do you need to have someone help you when you read instructions, pamphlets, or other written materials from your doctor or pharmacy?: 1 - Never  Interpreter Needed?: No  Information entered by :: Clemetine Marker LPN  Past Medical History:  Diagnosis Date  . Allergy   . Anxiety   . Depression   . Diabetes mellitus without complication ([place])   . GERD (gastroesophageal reflux disease)   . Hyperlipidemia   . Hypertension   .  Sleep apnea    does not use CPAP   Past Surgical History:  Procedure Laterality Date  . BUNIONECTOMY Bilateral   . CARPAL TUNNEL RELEASE Left   . LUMBAR LAMINECTOMY    . MASTECTOMY MODIFIED RADICAL Bilateral 1993  . PARTIAL HYSTERECTOMY    . TOTAL KNEE ARTHROPLASTY Bilateral   . TOTAL SHOULDER REPLACEMENT Bilateral    Family History  Problem Relation Age of Onset  . Diabetes Mother   . Hypertension Mother     . CAD Father   . Diabetes Father   . Diabetes Sister   . Hypertension Sister   . Heart disease Brother   . Healthy Sister    Social History   Socioeconomic History  . Marital status: Divorced    Spouse name: Not on file  . Number of children: 1  . Years of education: some college  . Highest education level: 12th grade  Occupational History  . Occupation: Retired    Comment: works part time for [employer]  . Smoking status: Never Smoker  . Smokeless tobacco: Never Used  . Tobacco comment: smoking cessation materials not required  Substance and Sexual Activity  . Alcohol use: Not Currently  . Drug use: No  . Sexual activity: Not Currently  Other Topics Concern  . Not on file  Social History Narrative  . Not on file   Social Determinants of Health   Financial Resource Strain: Low Risk   . Difficulty of Paying Living Expenses: Not hard at all  Food Insecurity: No Food Insecurity  . Worried About Charity fundraiser in the Last Year: Never true  . Ran Out of Food in the Last Year: Never true  Transportation Needs: No Transportation Needs  . Lack of Transportation (Medical): No  . Lack of Transportation (Non-Medical): No  Physical Activity: Inactive  . Days of Exercise per Week: 0 days  . Minutes of Exercise per Session: 0 min  Stress: No Stress Concern Present  . Feeling of Stress : Only a little  Social Connections: Moderately Isolated  . Frequency of Communication with Friends and Family: More than three times a week  . Frequency of Social Gatherings with Friends and Family: Three times a week  . Attends Religious Services: Never  . Active Member of Clubs or Organizations: No  . Attends Archivist Meetings: Never  . Marital Status: Divorced    Outpatient Encounter Medications as of 07/14/2019  Medication Sig  . amLODipine (NORVASC) 2.5 MG tablet TAKE ONE TABLET BY MOUTH ONCE DAILY   . ascorbic acid (VITAMIN C) 250 MG tablet Take by  mouth.  Marland Kitchen aspirin 81 MG tablet Take 1 tablet by mouth daily.  Marland Kitchen atorvastatin (LIPITOR) 10 MG tablet TAKE ONE TABLET BY MOUTH ONCE DAILY   . Calcium Carb-Cholecalciferol (CALCIUM + D3) 600-200 MG-UNIT TABS Take 1 tablet by mouth daily.  . Cinnamon 500 MG TABS Take 2 tablets by mouth daily.  . Coenzyme Q10 (COQ10) 200 MG CAPS Take 1 capsule by mouth daily.  . cyanocobalamin 1000 MCG tablet Take 1,000 mcg by mouth daily.  Marland Kitchen desloratadine (CLARINEX) 5 MG tablet TAKE ONE TABLET BY MOUTH ONCE DAILY   . escitalopram (LEXAPRO) 10 MG tablet TAKE ONE TABLET BY MOUTH ONCE DAILY   . hydrochlorothiazide (HYDRODIURIL) 25 MG tablet TAKE ONE TABLET BY MOUTH ONCE DAILY   . lisinopril (ZESTRIL) 20 MG tablet TAKE ONE TABLET BY MOUTH ONCE DAILY   . metFORMIN (GLUCOPHAGE-XR) 500  MG 24 hr tablet TAKE ONE TABLET BY MOUTH TWICE DAILY   . Multiple Vitamin (MULTIVITAMIN) capsule Take 1 capsule by mouth daily.  Marland Kitchen nystatin cream (MYCOSTATIN) APPLY TOPICALLY TO RASH UNDER BREASTS AND ABDOMEN TWICE A DAY   . omeprazole (PRILOSEC) 20 MG capsule TAKE ONE CAPSULE BY MOUTH ONCE DAILY   . oxaprozin (DAYPRO) 600 MG tablet TAKE ONE TABLET BY MOUTH TWICE DAILY   . [DISCONTINUED] diazepam (VALIUM) 2 MG tablet Take 1 tablet (2 mg total) by mouth every 8 (eight) hours as needed for anxiety.   No facility-administered encounter medications on file as of 07/14/2019.    Activities of Daily Living In your present state of health, do you have any difficulty performing the following activities: 07/14/2019  Hearing? N  Comment declines hearing aids  Vision? N  Difficulty concentrating or making decisions? N  Walking or climbing stairs? N  Dressing or bathing? N  Doing errands, shopping? N  Preparing Food and eating ? N  Using the Toilet? N  In the past six months, have you accidently leaked urine? N  Do you have problems with loss of bowel control? N  Managing your Medications? N  Managing your Finances? N  Housekeeping or  managing your Housekeeping? N  Some recent data might be hidden    Patient Care Team: Glean Hess, MD as PCP - General (Internal Medicine) San Jetty, MD as Referring Physician (Gastroenterology) Magda Kiel Erlene Senters, MD as Referring Physician (Dermatology) Arelia Sneddon, MD as Consulting Physician (Otolaryngology) Nelda Bucks, MD as Referring Physician (Ophthalmology)    Assessment:   This is a routine wellness examination for [place].  Exercise Activities and Dietary recommendations Current Exercise Habits: The patient does not participate in regular exercise at present, Exercise limited by: orthopedic condition(s)  Goals    . DIET - INCREASE WATER INTAKE     Recommend to drink at least 6-8 8oz glasses of water per day.    . lose weight     Pt states she would like to lose weight over the next year and would like to be <200 lbs.        Fall Risk Fall Risk  07/14/2019 03/29/2019 07/20/2018 07/08/2018 06/04/2018  Falls in the past year? 1 1 0 0 0  Number falls in past yr: 0 1 0 0 0  Injury with Fall? 1 1 0 0 0  Risk for fall due to : History of fall(s) History of fall(s);Impaired balance/gait;Impaired mobility - - -  Follow up Falls prevention discussed Falls evaluation completed Falls evaluation completed;Education provided Falls prevention discussed -   FALL RISK PREVENTION PERTAINING TO THE HOME:  Any stairs in or around the home? Yes  If so, are there any without handrails? Yes   Home free of loose throw rugs in walkways, pet beds, electrical cords, etc? Yes  Adequate lighting in your home to reduce risk of falls? Yes   ASSISTIVE DEVICES UTILIZED TO PREVENT FALLS:  Life alert? No  Use of a cane, walker or w/c? No  Grab bars in the bathroom? No  Shower chair or bench in shower? Yes  Elevated toilet seat or a handicapped toilet? No   DME ORDERS:  DME order needed?  No   TIMED UP AND GO:  Was the test performed? No . Telephonic visit.   Education:  Fall risk prevention has been discussed.  Intervention(s) required? No   DME/home health order needed?  No   Depression Screen PHQ 2/9  Scores 07/14/2019 03/29/2019 02/23/2019 11/20/2018  PHQ - 2 Score 2 4 0 0  PHQ- 9 Score 6 4 0 -     Cognitive Function     6CIT Screen 07/14/2019 07/08/2018 07/07/2017 07/01/2016  What Year? 0 points 0 points 0 points 0 points  What month? 0 points 0 points 0 points 0 points  What time? 0 points 0 points 0 points 0 points  Count back from 20 0 points 0 points 0 points 0 points  Months in reverse 0 points 0 points 0 points 0 points  Repeat phrase 0 points 0 points 0 points 0 points  Total Score 0 0 0 0    Immunization History  Administered Date(s) Administered  . Fluad Quad(high Dose 65+) 11/20/2018  . Hepatitis A, Adult 09/21/2018, 05/05/2019  . Influenza Split 02/02/2004, 01/02/2009  . Influenza, High Dose Seasonal PF 11/19/2016, 11/17/2017  . Influenza,inj,Quad PF,6+ Mos 01/01/2016  . Influenza,inj,quad, With Preservative 11/19/2018  . Influenza-Unspecified 11/25/2014  . PFIZER SARS-COV-2 Vaccination 03/24/2019, 04/14/2019  . Pneumococcal Conjugate-13 06/27/2014  . Pneumococcal Polysaccharide-23 07/20/2018  . Tdap 07/01/2016  . Unspecified SARS-COV-2 Vaccination 03/24/2019    Qualifies for Shingles Vaccine? Yes . Due for Shingrix. Education has been provided regarding the importance of this vaccine. Pt has been advised to call insurance company to determine out of pocket expense. Advised may also receive vaccine at local pharmacy or Health Dept. Verbalized acceptance and understanding.  Tdap: Up to date  Flu Vaccine: Up to date  Pneumococcal Vaccine: Up to date  Covid-19 Vaccine: Up to date   Screening Tests Health Maintenance  Topic Date Due  . FOOT EXAM  07/20/2019  . INFLUENZA VACCINE  09/19/2019  . HEMOGLOBIN A1C  09/26/2019  . OPHTHALMOLOGY EXAM  03/30/2020  . TETANUS/TDAP  07/02/2026  . DEXA SCAN  Completed  . COVID-19 Vaccine   Completed  . PNA vac Low Risk Adult  Discontinued    Cancer Screenings:  Colorectal Screening: Completed 07/02/13.  No longer required.   Mammogram:  No longer required.   Bone Density: Completed 07/23/18. Results reflect NORMAL. Repeat every 2 years.   Lung Cancer Screening: (Low Dose CT Chest recommended if Age 80-80 years, 30 pack-year currently smoking OR have quit w/in 15years.) does not qualify.    Additional Screening:  Hepatitis C Screening: no longer required  Vision Screening: Recommended annual ophthalmology exams for early detection of glaucoma and other disorders of the eye. Is the patient up to date with their annual eye exam?  Yes  Who is the provider or what is the name of the office in which the pt attends annual eye exams? [facility]  Dental Screening: Recommended annual dental exams for proper oral hygiene  Community Resource Referral:  CRR required this visit?  No      Plan:     I have personally reviewed and addressed the Medicare Annual Wellness questionnaire and have noted the following in the patient's chart:  A. Medical and social history B. Use of alcohol, tobacco or illicit drugs  C. Current medications and supplements D. Functional ability and status E.  Nutritional status F.  Physical activity G. Advance directives H. List of other physicians I.  Hospitalizations, surgeries, and ER visits in previous 12 months J.  [place] such as hearing and vision if needed, cognitive and depression L. Referrals and appointments   In addition, I have reviewed and discussed with patient certain preventive protocols, quality metrics, and best practice recommendations. A  written personalized care plan for preventive services as well as general preventive health recommendations were provided to patient.   Signed,  Clemetine Marker, LPN Nurse Health Advisor   Nurse Notes: pt still dealing with loss of her brother in Jan 2021. 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 She has started working at the [employer] again which she feels like is helping to stay busy.

## 2019-07-14 NOTE — Patient Instructions (Addendum)
Kimberly Walls , Thank you for taking time to come for your Medicare Wellness Visit. I appreciate your ongoing commitment to your health goals. Please review the following plan we discussed and let me know if I can assist you in the future.   Screening recommendations/referrals: Colonoscopy: done 07/02/13 Bone Density: done 07/23/18 Recommended yearly ophthalmology/optometry visit for glaucoma screening and checkup Recommended yearly dental visit for hygiene and checkup  Vaccinations: Influenza vaccine: done 11/20/18 Pneumococcal vaccine: done 07/20/18 Tdap vaccine: done 07/01/16 Shingles vaccine: Shingrix discussed. Please contact your pharmacy for coverage information.  Covid-19:done 03/24/19 & 04/14/19  Advanced directives: Please bring a copy of your health care power of attorney and living will to the office at your convenience once you have completed those documents  Conditions/risks identified: Recommend healthy eating and physical activity   Next appointment: Please follow up in one year for your Medicare Annual Wellness visit.     Preventive Care 33 Years and Older, Female Preventive care refers to lifestyle choices and visits with your health care provider that can promote health and wellness. What does preventive care include?  A yearly physical exam. This is also called an annual well check.  Dental exams once or twice a year.  Routine eye exams. Ask your health care provider how often you should have your eyes checked.  Personal lifestyle choices, including:  Daily care of your teeth and gums.  Regular physical activity.  Eating a healthy diet.  Avoiding tobacco and drug use.  Limiting alcohol use.  Practicing safe sex.  Taking low-dose aspirin every day.  Taking vitamin and mineral supplements as recommended by your health care provider. What happens during an annual well check? The services and screenings done by your health care provider during your annual well  check will depend on your age, overall health, lifestyle risk factors, and family history of disease. Counseling  Your health care provider may ask you questions about your:  Alcohol use.  Tobacco use.  Drug use.  Emotional well-being.  Home and relationship well-being.  Sexual activity.  Eating habits.  History of falls.  Memory and ability to understand (cognition).  Work and work Statistician.  Reproductive health. Screening  You may have the following tests or measurements:  Height, weight, and BMI.  Blood pressure.  Lipid and cholesterol levels. These may be checked every 5 years, or more frequently if you are over 31 years old.  Skin check.  Lung cancer screening. You may have this screening every year starting at age 48 if you have a 30-pack-year history of smoking and currently smoke or have quit within the past 15 years.  Fecal occult blood test (FOBT) of the stool. You may have this test every year starting at age 57.  Flexible sigmoidoscopy or colonoscopy. You may have a sigmoidoscopy every 5 years or a colonoscopy every 10 years starting at age 66.  Hepatitis C blood test.  Hepatitis B blood test.  Sexually transmitted disease (STD) testing.  Diabetes screening. This is done by checking your blood sugar (glucose) after you have not eaten for a while (fasting). You may have this done every 1-3 years.  Bone density scan. This is done to screen for osteoporosis. You may have this done starting at age 61.  Mammogram. This may be done every 1-2 years. Talk to your health care provider about how often you should have regular mammograms. Talk with your health care provider about your test results, treatment options, and if necessary, the need  for more tests. Vaccines  Your health care provider may recommend certain vaccines, such as:  Influenza vaccine. This is recommended every year.  Tetanus, diphtheria, and acellular pertussis (Tdap, Td) vaccine. You  may need a Td booster every 10 years.  Zoster vaccine. You may need this after age 34.  Pneumococcal 13-valent conjugate (PCV13) vaccine. One dose is recommended after age 61.  Pneumococcal polysaccharide (PPSV23) vaccine. One dose is recommended after age 28. Talk to your health care provider about which screenings and vaccines you need and how often you need them. This information is not intended to replace advice given to you by your health care provider. Make sure you discuss any questions you have with your health care provider. Document Released: 03/03/2015 Document Revised: 10/25/2015 Document Reviewed: 12/06/2014 Elsevier Interactive Patient Education  2017 [place] Prevention in the Home Falls can cause injuries. They can happen to people of all ages. There are many things you can do to make your home safe and to help prevent falls. What can I do on the outside of my home?  Regularly fix the edges of walkways and driveways and fix any cracks.  Remove anything that might make you trip as you walk through a door, such as a raised step or threshold.  Trim any bushes or trees on the path to your home.  Use bright outdoor lighting.  Clear any walking paths of anything that might make someone trip, such as rocks or tools.  Regularly check to see if handrails are loose or broken. Make sure that both sides of any steps have handrails.  Any raised decks and porches should have guardrails on the edges.  Have any leaves, snow, or ice cleared regularly.  Use sand or salt on walking paths during winter.  Clean up any spills in your garage right away. This includes oil or grease spills. What can I do in the bathroom?  Use night lights.  Install grab bars by the toilet and in the tub and shower. Do not use towel bars as grab bars.  Use non-skid mats or decals in the tub or shower.  If you need to sit down in the shower, use a plastic, non-slip stool.  Keep the floor  dry. Clean up any water that spills on the floor as soon as it happens.  Remove soap buildup in the tub or shower regularly.  Attach bath mats securely with double-sided non-slip rug tape.  Do not have throw rugs and other things on the floor that can make you trip. What can I do in the bedroom?  Use night lights.  Make sure that you have a light by your bed that is easy to reach.  Do not use any sheets or blankets that are too big for your bed. They should not hang down onto the floor.  Have a firm chair that has side arms. You can use this for support while you get dressed.  Do not have throw rugs and other things on the floor that can make you trip. What can I do in the kitchen?  Clean up any spills right away.  Avoid walking on wet floors.  Keep items that you use a lot in easy-to-reach places.  If you need to reach something above you, use a strong step stool that has a grab bar.  Keep electrical cords out of the way.  Do not use floor polish or wax that makes floors slippery. If you must use wax, use  non-skid floor wax.  Do not have throw rugs and other things on the floor that can make you trip. What can I do with my stairs?  Do not leave any items on the stairs.  Make sure that there are handrails on both sides of the stairs and use them. Fix handrails that are broken or loose. Make sure that handrails are as long as the stairways.  Check any carpeting to make sure that it is firmly attached to the stairs. Fix any carpet that is loose or worn.  Avoid having throw rugs at the top or bottom of the stairs. If you do have throw rugs, attach them to the floor with carpet tape.  Make sure that you have a light switch at the top of the stairs and the bottom of the stairs. If you do not have them, ask someone to add them for you. What else can I do to help prevent falls?  Wear shoes that:  Do not have high heels.  Have rubber bottoms.  Are comfortable and fit you  well.  Are closed at the toe. Do not wear sandals.  If you use a stepladder:  Make sure that it is fully opened. Do not climb a closed stepladder.  Make sure that both sides of the stepladder are locked into place.  Ask someone to hold it for you, if possible.  Clearly mark and make sure that you can see:  Any grab bars or handrails.  First and last steps.  Where the edge of each step is.  Use tools that help you move around (mobility aids) if they are needed. These include:  Canes.  Walkers.  Scooters.  Crutches.  Turn on the lights when you go into a dark area. Replace any light bulbs as soon as they burn out.  Set up your furniture so you have a clear path. Avoid moving your furniture around.  If any of your floors are uneven, fix them.  If there are any pets around you, be aware of where they are.  Review your medicines with your doctor. Some medicines can make you feel dizzy. This can increase your chance of falling. Ask your doctor what other things that you can do to help prevent falls. This information is not intended to replace advice given to you by your health care provider. Make sure you discuss any questions you have with your health care provider. Document Released: 12/01/2008 Document Revised: 07/13/2015 Document Reviewed: 03/11/2014 Elsevier Interactive Patient Education  2017 [employer].

## 2019-07-21 ENCOUNTER — Other Ambulatory Visit: Payer: Self-pay

## 2019-07-21 ENCOUNTER — Ambulatory Visit
Admission: RE | Admit: 2019-07-21 | Discharge: 2019-07-21 | Disposition: A | Discharge status: Home / Self Care | Payer: Medicare Other | Source: Ambulatory Visit | Attending: Internal Medicine | Admitting: Internal Medicine

## 2019-07-21 ENCOUNTER — Ambulatory Visit
Admission: RE | Admit: 2019-07-21 | Discharge: 2019-07-21 | Disposition: A | Discharge status: Home / Self Care | Payer: Medicare Other | Attending: Internal Medicine | Admitting: Internal Medicine

## 2019-07-21 ENCOUNTER — Encounter: Payer: Self-pay | Admitting: Internal Medicine

## 2019-07-21 ENCOUNTER — Ambulatory Visit (INDEPENDENT_AMBULATORY_CARE_PROVIDER_SITE_OTHER): Payer: Medicare Other | Admitting: Internal Medicine

## 2019-07-21 VITALS — BP 152/84 | HR 81 | Temp 97.8°F | Ht 63.0 in | Wt 207.0 lb

## 2019-07-21 DIAGNOSIS — Z853 Personal history of malignant neoplasm of breast: Secondary | ICD-10-CM

## 2019-07-21 DIAGNOSIS — F3341 Major depressive disorder, recurrent, in partial remission: Secondary | ICD-10-CM | POA: Diagnosis not present

## 2019-07-21 DIAGNOSIS — F430 Acute stress reaction: Secondary | ICD-10-CM

## 2019-07-21 DIAGNOSIS — E118 Type 2 diabetes mellitus with unspecified complications: Secondary | ICD-10-CM | POA: Diagnosis not present

## 2019-07-21 DIAGNOSIS — Z86010 Personal history of colonic polyps: Secondary | ICD-10-CM | POA: Diagnosis not present

## 2019-07-21 DIAGNOSIS — E785 Hyperlipidemia, unspecified: Secondary | ICD-10-CM | POA: Diagnosis not present

## 2019-07-21 DIAGNOSIS — E1169 Type 2 diabetes mellitus with other specified complication: Secondary | ICD-10-CM | POA: Diagnosis not present

## 2019-07-21 DIAGNOSIS — F411 Generalized anxiety disorder: Secondary | ICD-10-CM | POA: Diagnosis not present

## 2019-07-21 DIAGNOSIS — I10 Essential (primary) hypertension: Secondary | ICD-10-CM | POA: Diagnosis not present

## 2019-07-21 DIAGNOSIS — Z0000 Encounter for general adult medical examination without abnormal findings: Secondary | ICD-10-CM | POA: Diagnosis not present

## 2019-07-21 DIAGNOSIS — R079 Chest pain, unspecified: Secondary | ICD-10-CM | POA: Diagnosis not present

## 2019-07-21 LAB — POCT URINALYSIS DIPSTICK
Bilirubin, UA: NEGATIVE
Glucose, UA: NEGATIVE
Ketones, UA: NEGATIVE
Nitrite, UA: NEGATIVE
Protein, UA: NEGATIVE
Spec Grav, UA: <=1.005 — AB (ref 1.010–1.025)
Urobilinogen, UA: 0.2 E.U./dL
pH, UA: 6 (ref 5.0–8.0)

## 2019-07-21 MED ORDER — DIAZEPAM 2 MG PO TABS: 2.0000 mg | ORAL_TABLET | Freq: Three times a day (TID) | ORAL | 0 refills | Status: DC | PRN

## 2019-07-21 NOTE — Progress Notes (Signed)
Date:  07/21/2019   Name:  Kimberly Walls   DOB:  1940/05/23   MRN:  [id number]   Chief Complaint: Annual Exam (Breast Exam. No pap- aged out. Foot exam needed.) Kimberly Walls is a 79 y.o. female who presents today for her Complete Annual Exam. She feels fairly well. She reports exercising working 2 jobs. She reports she is sleeping fairly well.   Mammogram discontinued DEXA  07/2018 normal Colonoscopy aged out Immunization History  Administered Date(s) Administered  . Fluad Quad(high Dose 65+) 11/20/2018  . Hepatitis A, Adult 09/21/2018, 05/05/2019  . Influenza Split 02/02/2004, 01/02/2009  . Influenza, High Dose Seasonal PF 11/19/2016, 11/17/2017  . Influenza,inj,Quad PF,6+ Mos 01/01/2016  . Influenza,inj,quad, With Preservative 11/19/2018  . Influenza-Unspecified 11/25/2014  . PFIZER SARS-COV-2 Vaccination 03/24/2019, 04/14/2019  . Pneumococcal Conjugate-13 06/27/2014  . Pneumococcal Polysaccharide-23 07/20/2018  . Tdap 07/01/2016  . Unspecified SARS-COV-2 Vaccination 03/24/2019    Diabetes She presents for her follow-up diabetic visit. She has type 2 diabetes mellitus. Her disease course has been stable. Pertinent negatives for hypoglycemia include no dizziness, headaches, nervousness/anxiousness or tremors. Pertinent negatives for diabetes include no chest pain, no fatigue, no polydipsia and no polyuria. She is following a generally healthy diet. She monitors blood glucose at home 1-2 x per week. Her breakfast blood glucose is taken between 9-10 am. Her breakfast blood glucose range is generally 140-180 mg/dl. An ACE inhibitor/angiotensin II receptor blocker is being taken. Eye exam is current.  Hypertension This is a chronic problem. The problem is controlled. Pertinent negatives include no chest pain, headaches, palpitations or shortness of breath. Past treatments include diuretics, calcium channel blockers and ACE inhibitors. The current treatment provides significant  improvement.  Hyperlipidemia This is a chronic problem. Pertinent negatives include no chest pain or shortness of breath. Current antihyperlipidemic treatment includes statins. The current treatment provides significant improvement of lipids.  Depression        This is a chronic problem.The problem is unchanged.  Associated symptoms include no fatigue and no headaches.  Past treatments include SSRIs - Selective serotonin reuptake inhibitors.  Compliance with treatment is good.   Lab Results  Component Value Date   CREATININE 0.79 11/20/2018   BUN 23 11/20/2018   NA 138 11/20/2018   K 4.2 11/20/2018   CL 98 11/20/2018   CO2 23 11/20/2018   Lab Results  Component Value Date   CHOL 165 07/20/2018   HDL 42 07/20/2018   LDLCALC 66 07/20/2018   TRIG 285 (H) 07/20/2018   CHOLHDL 3.9 07/20/2018   Lab Results  Component Value Date   TSH 1.860 07/20/2018   Lab Results  Component Value Date   HGBA1C 7.0 (H) 03/29/2019   Lab Results  Component Value Date   WBC 6.9 07/20/2018   HGB 14.9 07/20/2018   HCT 44.5 07/20/2018   MCV 89 07/20/2018   PLT 237 07/20/2018   Lab Results  Component Value Date   ALT 19 07/20/2018   AST 26 07/20/2018   ALKPHOS 93 07/20/2018   BILITOT 0.5 07/20/2018     Review of Systems  Constitutional: Negative for chills, fatigue and fever.  HENT: Negative for congestion, hearing loss, tinnitus, trouble swallowing and voice change.   Eyes: Positive for visual disturbance.  Respiratory: Negative for cough, chest tightness, shortness of breath and wheezing.   Cardiovascular: Negative for chest pain, palpitations and leg swelling.  Gastrointestinal: Negative for abdominal pain, constipation, diarrhea and vomiting.  Endocrine: Negative for  polydipsia and polyuria.  Genitourinary: Negative for dysuria, frequency, genital sores, vaginal bleeding and vaginal discharge.  Musculoskeletal: Negative for arthralgias, gait problem and joint swelling.  Skin:  Negative for color change and rash.  Neurological: Negative for dizziness, tremors, light-headedness and headaches.  Hematological: Negative for adenopathy. Does not bruise/bleed easily.  Psychiatric/Behavioral: Positive for depression and dysphoric mood (still grieving her brothers death). Negative for sleep disturbance. The patient is not nervous/anxious.     Patient Active Problem List   Diagnosis Date Noted  . BMI 38.0-38.9,adult 06/29/2015  . History of pulmonary embolus (PE) 06/29/2015  . DM type 2, controlled, with complication ([place]) 123XX123  . Arthritis, degenerative 12/05/2014  . DDD (degenerative disc disease), lumbar 08/29/2014  . Hyperlipidemia associated with type 2 diabetes mellitus ([place]) 08/29/2014  . Essential (primary) hypertension 08/29/2014  . Acid reflux 08/29/2014  . Herpes 08/29/2014  . Obstructive sleep apnea of adult 08/29/2014  . Depression, major, recurrent, in partial remission ([place]) 08/29/2014  . Headache, tension-type 08/29/2014  . Dermatophytosis of groin 08/29/2014  . History of colon polyps 02/19/2008  . Personal history of malignant neoplasm of breast 02/19/1991    Allergies  Allergen Reactions  . Prochlorperazine   . Pneumococcal Vaccine Rash  . Tetanus Toxoids Rash    Past Surgical History:  Procedure Laterality Date  . BUNIONECTOMY Bilateral   . CARPAL TUNNEL RELEASE Left   . LUMBAR LAMINECTOMY    . MASTECTOMY MODIFIED RADICAL Bilateral 1993  . PARTIAL HYSTERECTOMY    . TOTAL KNEE ARTHROPLASTY Bilateral   . TOTAL SHOULDER REPLACEMENT Bilateral     Social History   Tobacco Use  . Smoking status: Never Smoker  . Smokeless tobacco: Never Used  . Tobacco comment: smoking cessation materials not required  Substance Use Topics  . Alcohol use: Not Currently  . Drug use: No     Medication list has been reviewed and updated.  Current Meds  Medication Sig  . amLODipine (NORVASC) 2.5 MG tablet TAKE ONE TABLET BY MOUTH ONCE DAILY     . ascorbic acid (VITAMIN C) 250 MG tablet Take by mouth.  Marland Kitchen aspirin 81 MG tablet Take 1 tablet by mouth daily.  Marland Kitchen atorvastatin (LIPITOR) 10 MG tablet TAKE ONE TABLET BY MOUTH ONCE DAILY   . Biotin 2500 MCG CAPS Take 5,000 mcg by mouth daily.  . Calcium Carb-Cholecalciferol (CALCIUM + D3) 600-200 MG-UNIT TABS Take 1 tablet by mouth daily.  . Cinnamon 500 MG TABS Take 2 tablets by mouth daily.  . Coenzyme Q10 (COQ10) 200 MG CAPS Take 1 capsule by mouth daily.  . cyanocobalamin 1000 MCG tablet Take 1,000 mcg by mouth daily.  Marland Kitchen desloratadine (CLARINEX) 5 MG tablet TAKE ONE TABLET BY MOUTH ONCE DAILY   . escitalopram (LEXAPRO) 10 MG tablet TAKE ONE TABLET BY MOUTH ONCE DAILY   . hydrochlorothiazide (HYDRODIURIL) 25 MG tablet TAKE ONE TABLET BY MOUTH ONCE DAILY   . lisinopril (ZESTRIL) 20 MG tablet TAKE ONE TABLET BY MOUTH ONCE DAILY   . Multiple Vitamin (MULTIVITAMIN) capsule Take 1 capsule by mouth daily.  Marland Kitchen omeprazole (PRILOSEC) 20 MG capsule TAKE ONE CAPSULE BY MOUTH ONCE DAILY   . oxaprozin (DAYPRO) 600 MG tablet TAKE ONE TABLET BY MOUTH TWICE DAILY   . zinc gluconate 50 MG tablet Take 50 mg by mouth daily.    PHQ 2/9 Scores 07/14/2019 03/29/2019 02/23/2019 11/20/2018  PHQ - 2 Score 2 4 0 0  PHQ- 9 Score 6 4 0 -  GAD 7 : Generalized Anxiety Score 07/21/2019  Nervous, Anxious, on Edge 0  Control/stop worrying 0  Worry too much - different things 0  Trouble relaxing 0  Restless 0  Easily annoyed or irritable 0  Afraid - awful might happen 0  Total GAD 7 Score 0  Anxiety Difficulty Not difficult at all    BP Readings from Last 3 Encounters:  07/21/19 (!) 152/84  03/29/19 (!) 146/85  02/23/19 136/86    Physical Exam Vitals and nursing note reviewed.  Constitutional:      General: She is not in acute distress.    Appearance: She is well-developed.  HENT:     Head: Normocephalic and atraumatic.     Right Ear: Tympanic membrane and ear canal normal.     Left Ear: Tympanic membrane  and ear canal normal.     Nose:     Right Sinus: No maxillary sinus tenderness.     Left Sinus: No maxillary sinus tenderness.  Eyes:     General: No scleral icterus.       Right eye: No discharge.        Left eye: No discharge.     Conjunctiva/sclera: Conjunctivae normal.  Neck:     Thyroid: No thyromegaly.     Vascular: No carotid bruit.  Cardiovascular:     Rate and Rhythm: Normal rate and regular rhythm.     Pulses: Normal pulses.     Heart sounds: Normal heart sounds.  Pulmonary:     Effort: Pulmonary effort is normal. No respiratory distress.     Breath sounds: No wheezing.  Chest:     Comments: S/p bilateral mastectomies with implants Skin intact without evidence of disease Abdominal:     General: Bowel sounds are normal.     Palpations: Abdomen is soft.     Tenderness: There is no abdominal tenderness.  Musculoskeletal:        General: Normal range of motion.     Cervical back: Normal range of motion. No erythema.     Right lower leg: No edema.     Left lower leg: No edema.  Lymphadenopathy:     Cervical: No cervical adenopathy.  Skin:    General: Skin is warm and dry.     Capillary Refill: Capillary refill takes less than 2 seconds.     Findings: No rash.  Neurological:     General: No focal deficit present.     Mental Status: She is alert and oriented to person, place, and time.     Cranial Nerves: No cranial nerve deficit.     Sensory: No sensory deficit.     Deep Tendon Reflexes: Reflexes are normal and symmetric.  Psychiatric:        Attention and Perception: Attention normal.        Mood and Affect: Mood normal. Affect is tearful (when speaking about her brother).        Speech: Speech normal.     Wt Readings from Last 3 Encounters:  07/21/19 207 lb (93.9 kg)  03/29/19 208 lb (94.3 kg)  02/23/19 214 lb (97.1 kg)    BP (!) 152/84   Pulse 81   Temp 97.8 F (36.6 C) (Oral)   Ht 5\' 3"  (1.6 m)   Wt 207 lb (93.9 kg)   SpO2 95%   BMI 36.67 kg/m     Assessment and Plan: 1. Annual physical exam  2. Essential (primary) hypertension Clinically stable exam but BP is not  well controlled today. Recommend checking BP at home to verify control.  Continue same regimen. Tolerating medications without side effects at this time. Pt to continue current regimen and low sodium diet; benefits of regular exercise as able discussed. - CBC with Differential/Platelet - TSH - POCT urinalysis dipstick  3. Hyperlipidemia associated with type 2 diabetes mellitus ([place]) Tolerating statin medication without side effects at this time LDL is at goal of < 70 on current dose Continue same therapy without change at this time. - Lipid panel  4. DM type 2, controlled, with complication ([place]) Clinically stable by exam and report without s/s of hypoglycemia. DM complicated by htn, lipids. Tolerating medications- metformin - well without side effects or other concerns. - Comprehensive metabolic panel - Hemoglobin A1c  5. Depression, major, recurrent, in partial remission ([place]) Clinically stable on current regimen with good control of symptoms, No SI or HI. Will continue current therapy.  Mild mood disturbance recently related to her brothers death.  6. Anxiety in acute stress reaction Will provide a few more valium to use as needed when dealing with her brothers estate - diazepam (VALIUM) 2 MG tablet; Take 1 tablet (2 mg total) by mouth every 8 (eight) hours as needed for anxiety.  Dispense: 20 tablet; Refill: 0  7. Personal history of malignant neoplasm of breast No evidence of disease recurrence Pt requests annual CXR to continue - DG Chest 2 View; Future  8. History of colon polyps Will request prior colonoscopy and discuss referral to another provider for 5 yr follow up   Partially dictated using Editor, commissioning. Any errors are unintentional.  Halina Maidens, MD [facility]  07/21/2019

## 2019-07-22 LAB — CBC WITH DIFFERENTIAL/PLATELET
Basophils Absolute: 0.1 10*3/uL (ref 0.0–0.2)
Basos: 1 %
EOS (ABSOLUTE): 0.3 10*3/uL (ref 0.0–0.4)
Eos: 4 %
Hematocrit: 43.2 % (ref 34.0–46.6)
Hemoglobin: 14.7 g/dL (ref 11.1–15.9)
Immature Grans (Abs): 0 10*3/uL (ref 0.0–0.1)
Immature Granulocytes: 0 %
Lymphocytes Absolute: 2.3 10*3/uL (ref 0.7–3.1)
Lymphs: 32 %
MCH: 29.3 pg (ref 26.6–33.0)
MCHC: 34 g/dL (ref 31.5–35.7)
MCV: 86 fL (ref 79–97)
Monocytes Absolute: 0.7 10*3/uL (ref 0.1–0.9)
Monocytes: 10 %
Neutrophils Absolute: 3.7 10*3/uL (ref 1.4–7.0)
Neutrophils: 53 %
Platelets: 241 10*3/uL (ref 150–450)
RBC: 5.02 x10E6/uL (ref 3.77–5.28)
RDW: 14.3 % (ref 11.7–15.4)
WBC: 7.2 10*3/uL (ref 3.4–10.8)

## 2019-07-22 LAB — HEMOGLOBIN A1C
Est. average glucose Bld gHb Est-mCnc: 143 mg/dL
Hgb A1c MFr Bld: 6.6 % — ABNORMAL HIGH (ref 4.8–5.6)

## 2019-07-22 LAB — COMPREHENSIVE METABOLIC PANEL
ALT: 15 IU/L (ref 0–32)
AST: 23 IU/L (ref 0–40)
Albumin/Globulin Ratio: 1.7 (ref 1.2–2.2)
Albumin: 4.2 g/dL (ref 3.7–4.7)
Alkaline Phosphatase: 102 IU/L (ref 48–121)
BUN/Creatinine Ratio: 23 (ref 12–28)
BUN: 19 mg/dL (ref 8–27)
Bilirubin Total: 0.5 mg/dL (ref 0.0–1.2)
CO2: 24 mmol/L (ref 20–29)
Calcium: 10.1 mg/dL (ref 8.7–10.3)
Chloride: 96 mmol/L (ref 96–106)
Creatinine, Ser: 0.83 mg/dL (ref 0.57–1.00)
GFR calc Af Amer: 79 mL/min/{1.73_m2} (ref >59)
GFR calc non Af Amer: 68 mL/min/{1.73_m2} (ref >59)
Globulin, Total: 2.5 g/dL (ref 1.5–4.5)
Glucose: 113 mg/dL — ABNORMAL HIGH (ref 65–99)
Potassium: 4.5 mmol/L (ref 3.5–5.2)
Sodium: 136 mmol/L (ref 134–144)
Total Protein: 6.7 g/dL (ref 6.0–8.5)

## 2019-07-22 LAB — LIPID PANEL
Chol/HDL Ratio: 3.7 ratio (ref 0.0–4.4)
Cholesterol, Total: 151 mg/dL (ref 100–199)
HDL: 41 mg/dL (ref >39)
LDL Chol Calc (NIH): 72 mg/dL (ref 0–99)
Triglycerides: 231 mg/dL — ABNORMAL HIGH (ref 0–149)
VLDL Cholesterol Cal: 38 mg/dL (ref 5–40)

## 2019-07-22 LAB — TSH: TSH: 3.39 u[IU]/mL (ref 0.450–4.500)

## 2019-09-16 DIAGNOSIS — L570 Actinic keratosis: Secondary | ICD-10-CM | POA: Diagnosis not present

## 2019-09-16 DIAGNOSIS — L243 Irritant contact dermatitis due to cosmetics: Secondary | ICD-10-CM | POA: Diagnosis not present

## 2019-09-16 DIAGNOSIS — L812 Freckles: Secondary | ICD-10-CM | POA: Diagnosis not present

## 2019-09-16 DIAGNOSIS — L304 Erythema intertrigo: Secondary | ICD-10-CM | POA: Diagnosis not present

## 2019-09-16 DIAGNOSIS — D1801 Hemangioma of skin and subcutaneous tissue: Secondary | ICD-10-CM | POA: Diagnosis not present

## 2019-09-16 DIAGNOSIS — D485 Neoplasm of uncertain behavior of skin: Secondary | ICD-10-CM | POA: Diagnosis not present

## 2019-09-16 DIAGNOSIS — Z1283 Encounter for screening for malignant neoplasm of skin: Secondary | ICD-10-CM | POA: Diagnosis not present

## 2019-09-16 DIAGNOSIS — L821 Other seborrheic keratosis: Secondary | ICD-10-CM | POA: Diagnosis not present

## 2019-09-16 DIAGNOSIS — D225 Melanocytic nevi of trunk: Secondary | ICD-10-CM | POA: Diagnosis not present

## 2019-09-16 DIAGNOSIS — L853 Xerosis cutis: Secondary | ICD-10-CM | POA: Diagnosis not present

## 2019-10-14 DIAGNOSIS — L081 Erythrasma: Secondary | ICD-10-CM | POA: Diagnosis not present

## 2019-10-14 DIAGNOSIS — S40861A Insect bite (nonvenomous) of right upper arm, initial encounter: Secondary | ICD-10-CM | POA: Diagnosis not present

## 2019-10-14 DIAGNOSIS — L570 Actinic keratosis: Secondary | ICD-10-CM | POA: Diagnosis not present

## 2019-10-14 DIAGNOSIS — Z1283 Encounter for screening for malignant neoplasm of skin: Secondary | ICD-10-CM | POA: Diagnosis not present

## 2019-10-14 DIAGNOSIS — Z09 Encounter for follow-up examination after completed treatment for conditions other than malignant neoplasm: Secondary | ICD-10-CM | POA: Diagnosis not present

## 2019-10-14 DIAGNOSIS — L298 Other pruritus: Secondary | ICD-10-CM | POA: Diagnosis not present

## 2019-10-14 DIAGNOSIS — D692 Other nonthrombocytopenic purpura: Secondary | ICD-10-CM | POA: Diagnosis not present

## 2019-10-29 ENCOUNTER — Telehealth: Payer: Self-pay

## 2019-10-29 NOTE — Chronic Care Management (AMB) (Signed)
  Chronic Care Management   Note  10/29/2019 Name: EYLIN PONTARELLI MRN: [id number] DOB: 1940-03-16  SAGAN WURZEL is a 79 y.o. year old female who is a primary care patient of Glean Hess, MD. I reached out to [name] by phone today in response to a referral sent by Ms. Joesphine Bare Gamarra's health plan.     Ms. Illingworth was given information about Chronic Care Management services today including:  1. CCM service includes personalized support from designated clinical staff supervised by her physician, including individualized plan of care and coordination with other care providers 2. 24/7 contact phone numbers for assistance for urgent and routine care needs. 3. Service will only be billed when office clinical staff spend 20 minutes or more in a month to coordinate care. 4. Only one practitioner may furnish and bill the service in a calendar month. 5. The patient may stop CCM services at any time (effective at the end of the month) by phone call to the office staff. 6. The patient will be responsible for cost sharing (co-pay) of up to 20% of the service fee (after annual deductible is met).  Patient wishes to consider information provided and/or speak with a member of the care team before deciding about enrollment in care management services.   Follow up plan: The patient has been provided with contact information for the care management team and has been advised to call with any health related questions or concerns.   Noreene Larsson, [place], [place], [place] [address] Direct Dial: [phone] [email or web address] Website: [email or web address]

## 2019-11-16 ENCOUNTER — Other Ambulatory Visit: Payer: Self-pay

## 2019-11-16 ENCOUNTER — Ambulatory Visit (INDEPENDENT_AMBULATORY_CARE_PROVIDER_SITE_OTHER): Payer: Medicare Other | Admitting: Internal Medicine

## 2019-11-16 ENCOUNTER — Encounter: Payer: Self-pay | Admitting: Internal Medicine

## 2019-11-16 VITALS — BP 124/80 | HR 87 | Temp 97.9°F | Ht 63.0 in | Wt 212.0 lb

## 2019-11-16 DIAGNOSIS — D485 Neoplasm of uncertain behavior of skin: Secondary | ICD-10-CM

## 2019-11-16 DIAGNOSIS — Z23 Encounter for immunization: Secondary | ICD-10-CM | POA: Diagnosis not present

## 2019-11-16 DIAGNOSIS — E118 Type 2 diabetes mellitus with unspecified complications: Secondary | ICD-10-CM

## 2019-11-16 DIAGNOSIS — I10 Essential (primary) hypertension: Secondary | ICD-10-CM

## 2019-11-16 NOTE — Progress Notes (Signed)
Date:  11/16/2019   Name:  Kimberly Walls   DOB:  26-Jul-1940   MRN:  [id number]   Chief Complaint: Diabetes (follow up - last reading 119 last week ), Hypertension, and Flu Vaccine  Diabetes She presents for her follow-up diabetic visit. She has type 2 diabetes mellitus. Her disease course has been stable. Pertinent negatives for hypoglycemia include no headaches or tremors. Pertinent negatives for diabetes include no chest pain, no fatigue, no polydipsia and no polyuria. Symptoms are stable. Current diabetic treatment includes oral agent (monotherapy). She is compliant with treatment all of the time. Her weight is stable. She is following a generally healthy diet. There is no change in her home blood glucose trend. Her breakfast blood glucose is taken between 6-7 am. Her breakfast blood glucose range is generally 110-130 mg/dl. An ACE inhibitor/angiotensin II receptor blocker is being taken.  Hypertension This is a chronic problem. The problem is unchanged. The problem is controlled. Pertinent negatives include no chest pain, headaches, palpitations, peripheral edema or shortness of breath. (Lightheadedness ) Past treatments include ACE inhibitors, calcium channel blockers and diuretics. The current treatment provides moderate improvement.    Lab Results  Component Value Date   CREATININE 0.83 07/21/2019   BUN 19 07/21/2019   NA 136 07/21/2019   K 4.5 07/21/2019   CL 96 07/21/2019   CO2 24 07/21/2019   Lab Results  Component Value Date   CHOL 151 07/21/2019   HDL 41 07/21/2019   LDLCALC 72 07/21/2019   TRIG 231 (H) 07/21/2019   CHOLHDL 3.7 07/21/2019   Lab Results  Component Value Date   TSH 3.390 07/21/2019   Lab Results  Component Value Date   HGBA1C 6.6 (H) 07/21/2019   Lab Results  Component Value Date   WBC 7.2 07/21/2019   HGB 14.7 07/21/2019   HCT 43.2 07/21/2019   MCV 86 07/21/2019   PLT 241 07/21/2019   Lab Results  Component Value Date   ALT 15 07/21/2019     AST 23 07/21/2019   ALKPHOS 102 07/21/2019   BILITOT 0.5 07/21/2019     Review of Systems  Constitutional: Negative for appetite change, diaphoresis, fatigue, fever and unexpected weight change.  HENT: Negative for tinnitus and trouble swallowing.   Eyes: Negative for visual disturbance.  Respiratory: Negative for cough, chest tightness and shortness of breath.   Cardiovascular: Negative for chest pain, palpitations and leg swelling.  Gastrointestinal: Negative for abdominal pain.  Endocrine: Negative for polydipsia and polyuria.  Genitourinary: Negative for dysuria and hematuria.  Musculoskeletal: Negative for arthralgias.  Neurological: Positive for light-headedness (when bending over). Negative for tremors, numbness and headaches.  Psychiatric/Behavioral: Negative for dysphoric mood.    Patient Active Problem List   Diagnosis Date Noted  . BMI 38.0-38.9,adult 06/29/2015  . History of pulmonary embolus (PE) 06/29/2015  . DM type 2, controlled, with complication ([place]) 01/60/1093  . Arthritis, degenerative 12/05/2014  . DDD (degenerative disc disease), lumbar 08/29/2014  . Hyperlipidemia associated with type 2 diabetes mellitus ([place]) 08/29/2014  . Essential (primary) hypertension 08/29/2014  . Acid reflux 08/29/2014  . Herpes 08/29/2014  . Obstructive sleep apnea of adult 08/29/2014  . Depression, major, recurrent, in partial remission ([place]) 08/29/2014  . Headache, tension-type 08/29/2014  . Dermatophytosis of groin 08/29/2014  . History of colon polyps 02/19/2008  . Personal history of malignant neoplasm of breast 02/19/1991    Allergies  Allergen Reactions  . Prochlorperazine   . Pneumococcal Vaccine Rash  .  Tetanus Toxoids Rash    Past Surgical History:  Procedure Laterality Date  . BUNIONECTOMY Bilateral   . CARPAL TUNNEL RELEASE Left   . LUMBAR LAMINECTOMY    . MASTECTOMY MODIFIED RADICAL Bilateral 1993  . PARTIAL HYSTERECTOMY    . TOTAL KNEE ARTHROPLASTY  Bilateral   . TOTAL SHOULDER REPLACEMENT Bilateral     Social History   Tobacco Use  . Smoking status: Never Smoker  . Smokeless tobacco: Never Used  . Tobacco comment: smoking cessation materials not required  Vaping Use  . Vaping Use: Never used  Substance Use Topics  . Alcohol use: Not Currently  . Drug use: No     Medication list has been reviewed and updated.  Current Meds  Medication Sig  . amLODipine (NORVASC) 2.5 MG tablet TAKE ONE TABLET BY MOUTH ONCE DAILY   . ascorbic acid (VITAMIN C) 250 MG tablet Take by mouth.  Marland Kitchen aspirin 81 MG tablet Take 1 tablet by mouth daily.  Marland Kitchen atorvastatin (LIPITOR) 10 MG tablet TAKE ONE TABLET BY MOUTH ONCE DAILY   . Biotin 2500 MCG CAPS Take 5,000 mcg by mouth daily.  . Calcium Carb-Cholecalciferol (CALCIUM + D3) 600-200 MG-UNIT TABS Take 1 tablet by mouth daily.  . Cinnamon 500 MG TABS Take 2 tablets by mouth daily.  . Coenzyme Q10 (COQ10) 200 MG CAPS Take 1 capsule by mouth daily.  . cyanocobalamin 1000 MCG tablet Take 1,000 mcg by mouth daily.  Marland Kitchen desloratadine (CLARINEX) 5 MG tablet TAKE ONE TABLET BY MOUTH ONCE DAILY   . escitalopram (LEXAPRO) 10 MG tablet TAKE ONE TABLET BY MOUTH ONCE DAILY   . hydrochlorothiazide (HYDRODIURIL) 25 MG tablet TAKE ONE TABLET BY MOUTH ONCE DAILY   . lisinopril (ZESTRIL) 20 MG tablet TAKE ONE TABLET BY MOUTH ONCE DAILY   . metFORMIN (GLUCOPHAGE-XR) 500 MG 24 hr tablet TAKE ONE TABLET BY MOUTH TWICE DAILY   . Multiple Vitamin (MULTIVITAMIN) capsule Take 1 capsule by mouth daily.  Marland Kitchen nystatin cream (MYCOSTATIN) APPLY TOPICALLY TO RASH UNDER BREASTS AND ABDOMEN TWICE A DAY   . omeprazole (PRILOSEC) 20 MG capsule TAKE ONE CAPSULE BY MOUTH ONCE DAILY   . oxaprozin (DAYPRO) 600 MG tablet TAKE ONE TABLET BY MOUTH TWICE DAILY     PHQ 2/9 Scores 07/21/2019 07/14/2019 03/29/2019 02/23/2019  PHQ - 2 Score 2 2 4  0  PHQ- 9 Score 6 6 4  0    GAD 7 : Generalized Anxiety Score 07/21/2019  Nervous, Anxious, on Edge 0    Control/stop worrying 0  Worry too much - different things 0  Trouble relaxing 0  Restless 0  Easily annoyed or irritable 0  Afraid - awful might happen 0  Total GAD 7 Score 0  Anxiety Difficulty Not difficult at all    BP Readings from Last 3 Encounters:  11/16/19 (!) 142/90  07/21/19 (!) 152/84  03/29/19 (!) 146/85    Physical Exam Vitals and nursing note reviewed.  Constitutional:      General: She is not in acute distress.    Appearance: She is well-developed.  HENT:     Head: Normocephalic and atraumatic.  Cardiovascular:     Rate and Rhythm: Normal rate and regular rhythm.     Pulses: Normal pulses.     Heart sounds: No murmur heard.   Pulmonary:     Effort: Pulmonary effort is normal. No respiratory distress.  Musculoskeletal:        General: Normal range of motion.  Cervical back: Normal range of motion.     Right lower leg: No edema.     Left lower leg: No edema.  Skin:    General: Skin is warm and dry.     Comments: Scattered SK and AK;  Suspicious lesion on right lower leg   Neurological:     Mental Status: She is alert and oriented to person, place, and time.  Psychiatric:        Behavior: Behavior normal.        Thought Content: Thought content normal.     Wt Readings from Last 3 Encounters:  11/16/19 212 lb (96.2 kg)  07/21/19 207 lb (93.9 kg)  03/29/19 208 lb (94.3 kg)    BP (!) 142/90 (BP Location: Right Arm, Patient Position: Sitting)   Pulse 87   Temp 97.9 F (36.6 C) (Oral)   Ht 5\' 3"  (1.6 m)   Wt 212 lb (96.2 kg)   SpO2 94%   BMI 37.55 kg/m   Assessment and Plan: 1. DM type 2, controlled, with complication ([place]) Clinically stable by exam and report without s/s of hypoglycemia. DM complicated by HTN. Tolerating medications well without side effects or other concerns.  2. Essential (primary) hypertension Clinically stable exam with well controlled BP. Tolerating medications without side effects at this time. Pt to  continue current regimen and low sodium diet; benefits of regular exercise as able discussed.  3. Neoplasm of uncertain behavior of skin Recommend follow up with dermatology   Partially dictated using Dragon software. Any errors are unintentional.  Halina Maidens, MD [facility]  11/16/2019

## 2019-11-17 LAB — BASIC METABOLIC PANEL
BUN/Creatinine Ratio: 30 — ABNORMAL HIGH (ref 12–28)
BUN: 27 mg/dL (ref 8–27)
CO2: 23 mmol/L (ref 20–29)
Calcium: 9.9 mg/dL (ref 8.7–10.3)
Chloride: 95 mmol/L — ABNORMAL LOW (ref 96–106)
Creatinine, Ser: 0.89 mg/dL (ref 0.57–1.00)
GFR calc Af Amer: 72 mL/min/{1.73_m2} (ref >59)
GFR calc non Af Amer: 62 mL/min/{1.73_m2} (ref >59)
Glucose: 155 mg/dL — ABNORMAL HIGH (ref 65–99)
Potassium: 4.4 mmol/L (ref 3.5–5.2)
Sodium: 135 mmol/L (ref 134–144)

## 2019-11-17 LAB — HEMOGLOBIN A1C
Est. average glucose Bld gHb Est-mCnc: 151 mg/dL
Hgb A1c MFr Bld: 6.9 % — ABNORMAL HIGH (ref 4.8–5.6)

## 2019-12-28 DIAGNOSIS — Z23 Encounter for immunization: Secondary | ICD-10-CM | POA: Diagnosis not present

## 2020-01-03 DIAGNOSIS — M25511 Pain in right shoulder: Secondary | ICD-10-CM | POA: Diagnosis not present

## 2020-01-10 DIAGNOSIS — M25511 Pain in right shoulder: Secondary | ICD-10-CM | POA: Diagnosis not present

## 2020-01-23 DIAGNOSIS — M25552 Pain in left hip: Secondary | ICD-10-CM | POA: Diagnosis not present

## 2020-01-23 DIAGNOSIS — M25551 Pain in right hip: Secondary | ICD-10-CM | POA: Diagnosis not present

## 2020-02-04 ENCOUNTER — Ambulatory Visit: Payer: Self-pay | Admitting: *Deleted

## 2020-02-04 NOTE — Telephone Encounter (Signed)
Pt called in c/o waking up Wed. Morning with bad diarrhea, being numb on the right side of her body from her right shoulder to right hip down her leg and into her right foot.  She was like that all day Wed.   Also having diarrhea too. She's also c/o breaking out in sweats that started Wed. Morning and is still happening. When she woke up [place]. Morning the numbness was gone and she no longer was having diarrhea.   On Wed and Thur.. she mentioned she was so dizzy she had to hold onto things around the house to get around.   She's not doing that today but still having the hot, sweaty spells. Her big complaint now is extreme weakness.   "I can't hardly put one foot in front of the other".  Her speech is clear and she's answering my questions appropriately.  I have referred her to the ED.  Her brother lives up the street so she is going to call him and see if he will take her to "[facility]" ED.  I instructed her to call 911 if her brother could not take her but it was very important she get to the ED.   She verbalized understanding and agreed to call 911 if it did not work out with her brother.  [facility] closed today at 12 noon.  Her PCP is Dr. Halina Maidens.   Reason for Disposition . [1] Numbness (i.e., loss of sensation) of the face, arm / hand, or leg / foot on one side of the body AND [2] sudden onset AND [3] brief (now gone)  Answer Assessment - Initial Assessment Questions 1. SYMPTOM: "What is the main symptom you are concerned about?" (e.g., weakness, numbness)     Wed. Morning I  Had a upset stomach and diarrhea.  My hip, shoulder and foot on right side.  was numb.  The numbness is gone now.  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 When I woke up.    I'm so weak.  I'm trying to drink Gator Aid.   2. ONSET: "When did this start?" (minutes, hours, days; while sleeping)     Wed. Morning.     I'm very weak and sweating. Don't have a thermometer.   3. LAST NORMAL: "When was the last time you  were normal (no symptoms)?"     Woke up Wed morning and the numbness was gone when I woke up Thursday.     I'm having hot spells. 4. PATTERN "Does this come and go, or has it been constant since it started?"  "Is it present now?"     I'm breaking out in sweats since Wed. 5. CARDIAC SYMPTOMS: "Have you had any of the following symptoms: chest pain, difficulty breathing, palpitations?"     No shortness of breath or chest pain. 6. NEUROLOGIC SYMPTOMS: "Have you had any of the following symptoms: headache, dizziness, vision loss, double vision, changes in speech, unsteady on your feet?"     I've had a headache since Wed.   I took Tylenol and that helped.   7. OTHER SYMPTOMS: "Do you have any other symptoms?"     No 8. PREGNANCY: "Is there any chance you are pregnant?" "When was your last menstrual period?"     N/A due to age.  Protocols used: NEUROLOGIC DEFICIT-A-AH

## 2020-02-15 ENCOUNTER — Other Ambulatory Visit: Payer: Self-pay | Admitting: Internal Medicine

## 2020-02-15 DIAGNOSIS — I10 Essential (primary) hypertension: Secondary | ICD-10-CM

## 2020-02-15 DIAGNOSIS — F3341 Major depressive disorder, recurrent, in partial remission: Secondary | ICD-10-CM

## 2020-03-02 ENCOUNTER — Encounter: Payer: Self-pay | Admitting: Internal Medicine

## 2020-03-02 ENCOUNTER — Other Ambulatory Visit: Payer: Self-pay

## 2020-03-02 ENCOUNTER — Ambulatory Visit (INDEPENDENT_AMBULATORY_CARE_PROVIDER_SITE_OTHER): Payer: Medicare Other | Admitting: Internal Medicine

## 2020-03-02 VITALS — BP 144/90 | HR 87 | Temp 98.1°F | Ht 63.0 in | Wt 212.0 lb

## 2020-03-02 DIAGNOSIS — E118 Type 2 diabetes mellitus with unspecified complications: Secondary | ICD-10-CM

## 2020-03-02 DIAGNOSIS — F3341 Major depressive disorder, recurrent, in partial remission: Secondary | ICD-10-CM | POA: Diagnosis not present

## 2020-03-02 DIAGNOSIS — I10 Essential (primary) hypertension: Secondary | ICD-10-CM | POA: Diagnosis not present

## 2020-03-02 DIAGNOSIS — Z1159 Encounter for screening for other viral diseases: Secondary | ICD-10-CM

## 2020-03-02 MED ORDER — GLUCOSE BLOOD VI STRP: ORAL_STRIP | 12 refills | Status: AC

## 2020-03-02 NOTE — Progress Notes (Signed)
Date:  03/02/2020   Name:  Kimberly Walls   DOB:  08-24-1940   MRN:  [id number]   Chief Complaint: Diabetes (Last reading x2 days ago 159 /)  Diabetes She presents for her follow-up diabetic visit. She has type 2 diabetes mellitus. Pertinent negatives for hypoglycemia include no headaches or tremors. Pertinent negatives for diabetes include no chest pain, no fatigue, no polydipsia and no polyuria. Current diabetic treatment includes oral agent (monotherapy) (metformin 500 mg bid). Her weight is stable. An ACE inhibitor/angiotensin II receptor blocker is being taken. Eye exam is current.  Hypertension This is a chronic problem. The problem is controlled. Pertinent negatives include no chest pain, headaches, palpitations or shortness of breath.  Depression        This is a chronic problem.  The problem has been resolved since onset.  Associated symptoms include no fatigue, no appetite change and no headaches.  Past treatments include SSRIs - Selective serotonin reuptake inhibitors.  Compliance with treatment is good.  Previous treatment provided significant relief.   Lab Results  Component Value Date   CREATININE 0.89 11/16/2019   BUN 27 11/16/2019   NA 135 11/16/2019   K 4.4 11/16/2019   CL 95 (L) 11/16/2019   CO2 23 11/16/2019   Lab Results  Component Value Date   CHOL 151 07/21/2019   HDL 41 07/21/2019   LDLCALC 72 07/21/2019   TRIG 231 (H) 07/21/2019   CHOLHDL 3.7 07/21/2019   Lab Results  Component Value Date   TSH 3.390 07/21/2019   Lab Results  Component Value Date   HGBA1C 6.9 (H) 11/16/2019   Lab Results  Component Value Date   WBC 7.2 07/21/2019   HGB 14.7 07/21/2019   HCT 43.2 07/21/2019   MCV 86 07/21/2019   PLT 241 07/21/2019   Lab Results  Component Value Date   ALT 15 07/21/2019   AST 23 07/21/2019   ALKPHOS 102 07/21/2019   BILITOT 0.5 07/21/2019     Review of Systems  Constitutional: Negative for appetite change, fatigue, fever and unexpected  weight change.  HENT: Negative for tinnitus and trouble swallowing.   Eyes: Negative for visual disturbance.  Respiratory: Negative for cough, chest tightness and shortness of breath.   Cardiovascular: Negative for chest pain, palpitations and leg swelling.  Gastrointestinal: Negative for abdominal pain.  Endocrine: Negative for polydipsia and polyuria.  Genitourinary: Negative for dysuria and hematuria.  Musculoskeletal: Negative for arthralgias.  Neurological: Negative for tremors, numbness and headaches.  Psychiatric/Behavioral: Positive for depression. Negative for dysphoric mood.    Patient Active Problem List   Diagnosis Date Noted  . BMI 38.0-38.9,adult 06/29/2015  . History of pulmonary embolus (PE) 06/29/2015  . DM type 2, controlled, with complication (HCC) 03/06/2015  . Arthritis, degenerative 12/05/2014  . DDD (degenerative disc disease), lumbar 08/29/2014  . Hyperlipidemia associated with type 2 diabetes mellitus (HCC) 08/29/2014  . Essential (primary) hypertension 08/29/2014  . Acid reflux 08/29/2014  . Herpes 08/29/2014  . Obstructive sleep apnea of adult 08/29/2014  . Depression, major, recurrent, in partial remission (HCC) 08/29/2014  . Headache, tension-type 08/29/2014  . Dermatophytosis of groin 08/29/2014  . History of colon polyps 02/19/2008  . Personal history of malignant neoplasm of breast 02/19/1991    Allergies  Allergen Reactions  . Prochlorperazine   . Pneumococcal Vaccine Rash  . Tetanus Toxoids Rash    Past Surgical History:  Procedure Laterality Date  . BUNIONECTOMY Bilateral   . CARPAL TUNNEL  RELEASE Left   . LUMBAR LAMINECTOMY    . MASTECTOMY MODIFIED RADICAL Bilateral 1993  . PARTIAL HYSTERECTOMY    . TOTAL KNEE ARTHROPLASTY Bilateral   . TOTAL SHOULDER REPLACEMENT Bilateral     Social History   Tobacco Use  . Smoking status: Never Smoker  . Smokeless tobacco: Never Used  . Tobacco comment: smoking cessation materials not  required  Vaping Use  . Vaping Use: Never used  Substance Use Topics  . Alcohol use: Not Currently  . Drug use: No     Medication list has been reviewed and updated.  Current Meds  Medication Sig  . amLODipine (NORVASC) 2.5 MG tablet TAKE ONE TABLET BY MOUTH ONCE DAILY  . ascorbic acid (VITAMIN C) 250 MG tablet Take by mouth.  Marland Kitchen aspirin 81 MG tablet Take 1 tablet by mouth daily.  Marland Kitchen atorvastatin (LIPITOR) 10 MG tablet TAKE ONE TABLET BY MOUTH ONCE DAILY   . Biotin 2500 MCG CAPS Take 5,000 mcg by mouth daily.  . Calcium Carb-Cholecalciferol (CALCIUM + D3) 600-200 MG-UNIT TABS Take 1 tablet by mouth daily.  . Cinnamon 500 MG TABS Take 2 tablets by mouth daily.  . Coenzyme Q10 (COQ10) 200 MG CAPS Take 1 capsule by mouth daily.  . cyanocobalamin 1000 MCG tablet Take 1,000 mcg by mouth daily.  Marland Kitchen desloratadine (CLARINEX) 5 MG tablet TAKE ONE TABLET BY MOUTH ONCE DAILY   . diazepam (VALIUM) 2 MG tablet Take 1 tablet (2 mg total) by mouth every 8 (eight) hours as needed for anxiety.  Marland Kitchen escitalopram (LEXAPRO) 10 MG tablet TAKE ONE TABLET BY MOUTH ONCE DAILY  . hydrochlorothiazide (HYDRODIURIL) 25 MG tablet TAKE ONE TABLET BY MOUTH ONCE DAILY   . lisinopril (ZESTRIL) 20 MG tablet TAKE ONE TABLET BY MOUTH ONCE DAILY   . metFORMIN (GLUCOPHAGE-XR) 500 MG 24 hr tablet TAKE ONE TABLET BY MOUTH TWICE DAILY   . Multiple Vitamin (MULTIVITAMIN) capsule Take 1 capsule by mouth daily.  Marland Kitchen nystatin cream (MYCOSTATIN) APPLY TOPICALLY TO RASH UNDER BREASTS AND ABDOMEN TWICE A DAY   . omeprazole (PRILOSEC) 20 MG capsule TAKE ONE CAPSULE BY MOUTH ONCE DAILY   . oxaprozin (DAYPRO) 600 MG tablet TAKE ONE TABLET BY MOUTH TWICE DAILY   . zinc gluconate 50 MG tablet Take 50 mg by mouth daily.    PHQ 2/9 Scores 03/02/2020 07/21/2019 07/14/2019 03/29/2019  PHQ - 2 Score 0 2 2 4   PHQ- 9 Score 0 6 6 4     GAD 7 : Generalized Anxiety Score 03/02/2020 07/21/2019  Nervous, Anxious, on Edge 0 0  Control/stop worrying 0 0   Worry too much - different things 0 0  Trouble relaxing 0 0  Restless 0 0  Easily annoyed or irritable 0 0  Afraid - awful might happen 0 0  Total GAD 7 Score 0 0  Anxiety Difficulty - Not difficult at all    BP Readings from Last 3 Encounters:  03/02/20 (!) 144/90  11/16/19 124/80  07/21/19 (!) 152/84    Physical Exam Vitals and nursing note reviewed.  Constitutional:      General: She is not in acute distress.    Appearance: She is well-developed. She is obese.  HENT:     Head: Normocephalic and atraumatic.  Cardiovascular:     Rate and Rhythm: Normal rate and regular rhythm.     Heart sounds: No murmur heard.   Pulmonary:     Effort: Pulmonary effort is normal. No respiratory distress.  Breath sounds: No wheezing or rhonchi.  Musculoskeletal:     Cervical back: Normal range of motion.     Right lower leg: No edema.     Left lower leg: No edema.  Lymphadenopathy:     Cervical: No cervical adenopathy.  Skin:    General: Skin is warm and dry.     Capillary Refill: Capillary refill takes less than 2 seconds.     Findings: No rash.  Neurological:     General: No focal deficit present.     Mental Status: She is alert and oriented to person, place, and time.  Psychiatric:        Mood and Affect: Mood and affect and mood normal.        Behavior: Behavior normal.     Wt Readings from Last 3 Encounters:  03/02/20 212 lb (96.2 kg)  11/16/19 212 lb (96.2 kg)  07/21/19 207 lb (93.9 kg)    BP (!) 144/90   Pulse 87   Temp 98.1 F (36.7 C) (Oral)   Ht 5\' 3"  (1.6 m)   Wt 212 lb (96.2 kg)   SpO2 97%   BMI 37.55 kg/m   Assessment and Plan: 1. DM type 2, controlled, with complication ([place]) Clinically stable by exam and report without s/s of hypoglycemia. DM complicated by HTN. Tolerating medications -metformin -  well without side effects or other concerns. Concern that A1C may be higher since home BS are higher May need to add medication or increase the  dose of metformin - Hemoglobin A1c - glucose blood test strip; Use to test BS twice a day  Dispense: 100 each; Refill: 12  2. Essential (primary) hypertension Clinically stable exam.  BP elevated today due to recent stress - family visiting due to recent death in the family; possibly increased sodium intake Tolerating medications without side effects at this time. Pt to continue current regimen and low sodium diet; benefits of regular exercise as able discussed.  3. Need for hepatitis C screening test - Hepatitis C antibody  4. Depression, major, recurrent, in partial remission ([place]) Clinically stable on current regimen with good control of symptoms, No SI or HI. Will continue current therapy with Lexapro 10 mg.    Partially dictated using Editor, commissioning. Any errors are unintentional.  Halina Maidens, MD [facility]  03/02/2020

## 2020-03-03 LAB — HEMOGLOBIN A1C
Est. average glucose Bld gHb Est-mCnc: 160 mg/dL
Hgb A1c MFr Bld: 7.2 % — ABNORMAL HIGH (ref 4.8–5.6)

## 2020-03-03 LAB — HEPATITIS C ANTIBODY: Hep C Virus Ab: <0.1 s/co ratio (ref 0.0–0.9)

## 2020-03-20 ENCOUNTER — Other Ambulatory Visit: Payer: Self-pay | Admitting: Internal Medicine

## 2020-03-20 DIAGNOSIS — E785 Hyperlipidemia, unspecified: Secondary | ICD-10-CM

## 2020-03-29 ENCOUNTER — Encounter: Payer: Self-pay | Admitting: Internal Medicine

## 2020-03-29 LAB — HM DIABETES EYE EXAM

## 2020-06-17 ENCOUNTER — Other Ambulatory Visit: Payer: Self-pay | Admitting: Internal Medicine

## 2020-06-17 NOTE — Telephone Encounter (Signed)
Requested Prescriptions  Pending Prescriptions Disp Refills  . oxaprozin (DAYPRO) 600 MG tablet [Pharmacy Med Name: Oxaprozin Oral Tablet 600 MG] 60 tablet     Sig: TAKE ONE TABLET BY MOUTH TWICE DAILY     Analgesics:  NSAIDS Passed - 06/17/2020  1:38 PM      Passed - Cr in normal range and within 360 days    Creatinine, Ser  Date Value Ref Range Status  11/16/2019 0.89 0.57 - 1.00 mg/dL Final         Passed - HGB in normal range and within 360 days    Hemoglobin  Date Value Ref Range Status  07/21/2019 14.7 11.1 - 15.9 g/dL Final         Passed - Patient is not pregnant      Passed - Valid encounter within last 12 months    Recent Outpatient Visits          3 months ago DM type 2, controlled, with complication [facility])   [facility]an [facility], [facility]   7 months ago DM type 2, controlled, with complication [facility])   [facility], [facility]   11 months ago Essential (primary) hypertension   [facility], [facility]   1 year ago DM type 2, controlled, with complication [facility])   [facility], [facility]   1 year ago Cellulitis of left upper extremity   [facility], [facility]      Future Appointments            In 1 month Army Melia Jesse Sans, MD [facility]           . metFORMIN (GLUCOPHAGE-XR) 500 MG 24 hr tablet [Pharmacy Med Name: metFORMIN HCl ER Oral Tablet Extended Release 24 Hour 500 MG] 60 tablet 0    Sig: TAKE ONE TABLET BY MOUTH TWICE DAILY     Endocrinology:  Diabetes - Biguanides Passed - 06/17/2020  1:38 PM      Passed - Cr in normal range and within 360 days    Creatinine, Ser  Date Value Ref Range Status  11/16/2019 0.89 0.57 - 1.00 mg/dL Final         Passed - HBA1C is between 0 and 7.9 and within 180 days    Hgb A1c MFr Bld  Date Value Ref Range Status  03/02/2020 7.2 (H) 4.8 - 5.6 % Final    Comment:             Prediabetes: 5.7 - 6.4           Diabetes: >6.4          Glycemic control for adults with diabetes: <7.0          Passed - eGFR in normal range and within 360 days    GFR calc Af Amer  Date Value Ref Range Status  11/16/2019 72 >59 mL/min/1.73 Final    Comment:    **[facility] currently reports eGFR in compliance with the current**   recommendations of the [employer]. [facility] will   update reporting as new guidelines are published from the NKF-ASN   Task force.    GFR calc non Af Amer  Date Value Ref Range Status  11/16/2019 62 >59 mL/min/1.73 Final         Passed - Valid encounter within last 6 months    Recent Outpatient Visits  3 months ago DM type 2, controlled, with complication [facility])   [facility] Glean [facility], [facility]   7 months ago DM type 2, controlled, with complication [facility]   [facility] Glean [facility], [facility]   11 months ago Essential (primary) hypertension   [facility] Gle[facility], [facility]   1 year ago DM type 2, controlled, with complication [facility])   [facility] Glean [facility], [facility]   1 year ago Cellulitis of left upper extremity   [facility], [facility]      Future Appointments            In 1 month Army Melia Jesse Sans, MD [facility], [facility]

## 2020-06-17 NOTE — Telephone Encounter (Signed)
Requested medication (s) are due for refill today: yes  Requested medication (s) are on the active medication list: yes  Last refill:  05/17/19  Future visit scheduled: yes  Notes to clinic:  last OV where issue was addressed 02/23/19. Routing to office to review.   Requested Prescriptions  Pending Prescriptions Disp Refills   oxaprozin (DAYPRO) 600 MG tablet [Pharmacy Med Name: Oxaprozin Oral Tablet 600 MG] 60 tablet     Sig: TAKE ONE TABLET BY MOUTH TWICE DAILY      Analgesics:  NSAIDS Passed - 06/17/2020  1:38 PM      Passed - Cr in normal range and within 360 days    Creatinine, Ser  Date Value Ref Range Status  11/16/2019 0.89 0.57 - 1.00 mg/dL Final          Passed - HGB in normal range and within 360 days    Hemoglobin  Date Value Ref Range Status  07/21/2019 14.7 11.1 - 15.9 g/dL Final          Passed - Patient is not pregnant      Passed - Valid encounter within last 12 months    Recent Outpatient Visits           3 months ago DM type 2, controlled, with complication [facility])   [facility], [facility]   7 months ago DM type 2, controlled, with complication [facility])   [facility]   11 months ago Essential (primary) hypertension   [facility] Gle[facility], [facility]   1 year ago DM type 2, controlled, with complication [facility])   [facility], [facility]   1 year ago Cellulitis of left upper extremity   [facility], [facility]       Future Appointments             In 1 month Army Melia Jesse Sans, MD [facility], [facility]              Signed Prescriptions Disp Refills   metFORMIN (GLUCOPHAGE-XR) 500 MG 24 hr tablet 60 tablet 0    Sig: TAKE ONE TABLET BY MOUTH TWICE DAILY      Endocrinology:  Diabetes - Biguanides Passed - 06/17/2020  1:38 PM      Passed - Cr in normal range and within 360 days    Creatinine, Ser  Date Value Ref Range Status  11/16/2019  0.89 0.57 - 1.00 mg/dL Final          Passed - HBA1C is between 0 and 7.9 and within 180 days    Hgb A1c MFr Bld  Date Value Ref Range Status  03/02/2020 7.2 (H) 4.8 - 5.6 % Final    Comment:             Prediabetes: 5.7 - 6.4          Diabetes: >6.4          Glycemic control for adults with diabetes: <7.0           Passed - eGFR in normal range and within 360 days    GFR calc Af Amer  Date Value Ref Range Status  11/16/2019 72 >59 mL/min/1.73 Final    Comment:    **[facility] currently reports eGFR in compliance with the current**   recommendations of the [employer]. [facility] will   update reporting as new guidelines are published from the NKF-ASN  Task force.    GFR calc non Af Amer  Date Value Ref Range Status  11/16/2019 62 >59 mL/min/1.73 Final          Passed - Valid encounter within last 6 months    Recent Outpatient Visits           3 months ago DM type 2, controlled, with complication [facility])   [facility], [facility]   7 months ago DM type 2, controlled, with complication [facility])   [facility], [facility]   11 months ago Essential (primary) hypertension   [facility] Glean Hess, MD   1 year ago DM type 2, controlled, with complication [facility])   [facility] Glean Hess, MD   1 year ago Cellulitis of left upper extremity   [facility] Glean Hess, MD       Future Appointments             In 1 month Army Melia Jesse Sans, MD [facility]

## 2020-07-15 ENCOUNTER — Other Ambulatory Visit: Payer: Self-pay | Admitting: Internal Medicine

## 2020-07-18 NOTE — Telephone Encounter (Signed)
Requested Prescriptions  Pending Prescriptions Disp Refills  . metFORMIN (GLUCOPHAGE-XR) 500 MG 24 hr tablet [Pharmacy Med Name: metFORMIN HCl ER Oral Tablet Extended Release 24 Hour 500 MG] 60 tablet 0    Sig: TAKE ONE TABLET BY MOUTH TWICE DAILY     Endocrinology:  Diabetes - Biguanides Passed - 07/15/2020  3:04 PM      Passed - Cr in normal range and within 360 days    Creatinine, Ser  Date Value Ref Range Status  11/16/2019 0.89 0.57 - 1.00 mg/dL Final         Passed - HBA1C is between 0 and 7.9 and within 180 days    Hgb A1c MFr Bld  Date Value Ref Range Status  03/02/2020 7.2 (H) 4.8 - 5.6 % Final    Comment:             Prediabetes: 5.7 - 6.4          Diabetes: >6.4          Glycemic control for adults with diabetes: <7.0          Passed - eGFR in normal range and within 360 days    GFR calc Af Amer  Date Value Ref Range Status  11/16/2019 72 >59 mL/min/1.73 Final    Comment:    **[facility] currently reports eGFR in compliance with the current**   recommendations of the [employer]. [facility] will   update reporting as new guidelines are published from the NKF-ASN   Task force.    GFR calc non Af Amer  Date Value Ref Range Status  11/16/2019 62 >59 mL/min/1.73 Final         Passed - Valid encounter within last 6 months    Recent Outpatient Visits          4 months ago DM type 2, controlled, with complication [facility])   [facility] Glean [facility], [facility]   8 months ago DM type 2, controlled, with complication [facility])   [facility] Glean Hess, [facility]   12 months ago Essential (primary) hypertension   [facility] Glean Hess, MD   1 year ago DM type 2, controlled, with complication [facility])   [facility] Glean Hess, MD   1 year ago Cellulitis of left upper extremity   [facility] Glean Hess, MD      Future Appointments            In 1 week Army Melia Jesse Sans, MD [facility],  [facility]

## 2020-07-19 ENCOUNTER — Other Ambulatory Visit: Payer: Self-pay | Admitting: Internal Medicine

## 2020-07-19 ENCOUNTER — Ambulatory Visit: Payer: Medicare Other

## 2020-07-19 NOTE — Telephone Encounter (Signed)
Requested medication (s) are due for refill today: yes  Requested medication (s) are on the active medication list: yes  Last refill:  04/17/2020  Future visit scheduled: yes  Notes to clinic:  Medication not assigned to a protocol, review manually   Requested Prescriptions  Pending Prescriptions Disp Refills   nystatin cream (MYCOSTATIN) [Pharmacy Med Name: Nystatin External Cream 100000 UNIT/GM] 60 g 0    Sig: APPLY TOPICALLY TO RASH UNDER BREASTS AND ABDOMEN TWICE DAILY      Off-Protocol Failed - 07/19/2020 10:56 AM      Failed - Medication not assigned to a protocol, review manually.      Passed - Valid encounter within last 12 months    Recent Outpatient Visits           4 months ago DM type 2, controlled, with complication [facility])   [facility] Glean [facility], MD   8 months ago DM type 2, controlled, with complication [facility])   [facility] Glean Hess, MD   12 months ago Essential (primary) hypertension   [facility] Glean Hess, MD   1 year ago DM type 2, controlled, with complication [facility])   [facility] Glean [facility], [facility]   1 year ago Cellulitis of left upper extremity   [facility] Glean Hess, MD       Future Appointments             In 6 days Glean Hess, MD [facility], [facility]              Signed Prescriptions Disp Refills   oxaprozin (DAYPRO) 600 MG tablet 60 tablet 0    Sig: TAKE ONE TABLET BY MOUTH TWICE DAILY      Analgesics:  NSAIDS Failed - 07/19/2020 10:56 AM      Failed - HGB in normal range and within 360 days    Hemoglobin  Date Value Ref Range Status  07/21/2019 14.7 11.1 - 15.9 g/dL Final          Passed - Cr in normal range and within 360 days    Creatinine, Ser  Date Value Ref Range Status  11/16/2019 0.89 0.57 - 1.00 mg/dL Final          Passed - Patient is not pregnant      Passed - Valid encounter within last 12 months    Recent Outpatient  Visits           4 months ago DM type 2, controlled, with complication [facility])   [facility] Glean [facility], [facility]   8 months ago DM type 2, controlled, with complication [facility])   [facility] Gle[facility], [facility]   12 months ago Essential (primary) hypertension   [facility] Glean Hess, MD   1 year ago DM type 2, controlled, with complication [facility])   [facility] Glean Hess, [facility]   1 year ago Cellulitis of left upper extremity   [facility] Glean Hess, MD       Future Appointments             In 6 days Glean Hess, MD [facility], [facility]

## 2020-07-24 ENCOUNTER — Other Ambulatory Visit: Payer: Self-pay

## 2020-07-24 ENCOUNTER — Ambulatory Visit (INDEPENDENT_AMBULATORY_CARE_PROVIDER_SITE_OTHER): Payer: Medicare Other

## 2020-07-24 VITALS — BP 160/82 | HR 78 | Temp 98.1°F | Resp 17 | Ht 63.0 in | Wt 213.2 lb

## 2020-07-24 DIAGNOSIS — Z0000 Encounter for general adult medical examination without abnormal findings: Secondary | ICD-10-CM | POA: Diagnosis not present

## 2020-07-24 NOTE — Progress Notes (Signed)
Subjective:   Kimberly Walls is a 80 y.o. female who presents for Medicare Annual (Subsequent) preventive examination.  Review of Systems     Cardiac Risk Factors include: advanced age (>34men, >31 women);diabetes mellitus;dyslipidemia;hypertension;obesity (BMI >30kg/m2)     Objective:    Today's Vitals   07/24/20 1435 07/24/20 1439  BP: (!) 160/82   Pulse: 78   Resp: 17   Temp: 98.1 F (36.7 C)   TempSrc: Oral   SpO2: 96%   Weight: 213 lb 3.2 oz (96.7 kg)   Height: 5\' 3"  (1.6 m)   PainSc:  6    Body mass index is 37.77 kg/m.  Advanced Directives 07/24/2020 07/14/2019 07/07/2017 06/29/2015 03/06/2015  Does Patient Have a Medical Advance Directive? No No No No No  Does patient want to make changes to medical advance directive? No - Patient declined - - - -  Would patient like information on creating a medical advance directive? - No - Patient declined Yes (MAU/Ambulatory/Procedural Areas - Information given) No - patient declined information No - patient declined information    Current Medications (verified) Outpatient Encounter Medications as of 07/24/2020  Medication Sig  . amLODipine (NORVASC) 2.5 MG tablet TAKE ONE TABLET BY MOUTH ONCE DAILY  . ascorbic acid (VITAMIN C) 250 MG tablet Take by mouth.  Marland Kitchen aspirin 81 MG tablet Take 1 tablet by mouth daily.  Marland Kitchen atorvastatin (LIPITOR) 10 MG tablet TAKE ONE TABLET BY MOUTH ONCE DAILY  . Biotin 2500 MCG CAPS Take 5,000 mcg by mouth daily.  . Calcium Carb-Cholecalciferol (CALCIUM + D3) 600-200 MG-UNIT TABS Take 1 tablet by mouth daily.  . Cinnamon 500 MG TABS Take 2 tablets by mouth daily.  . Coenzyme Q10 (COQ10) 200 MG CAPS Take 1 capsule by mouth daily.  Marland Kitchen desloratadine (CLARINEX) 5 MG tablet TAKE ONE TABLET BY MOUTH ONCE DAILY  . escitalopram (LEXAPRO) 10 MG tablet TAKE ONE TABLET BY MOUTH ONCE DAILY  . glucose blood test strip Use to test BS twice a day  . hydrochlorothiazide (HYDRODIURIL) 25 MG tablet TAKE ONE TABLET BY MOUTH  ONCE DAILY   . lisinopril (ZESTRIL) 20 MG tablet TAKE ONE TABLET BY MOUTH ONCE DAILY  . metFORMIN (GLUCOPHAGE-XR) 500 MG 24 hr tablet TAKE ONE TABLET BY MOUTH TWICE DAILY  . Multiple Vitamin (MULTIVITAMIN) capsule Take 1 capsule by mouth daily.  Marland Kitchen nystatin cream (MYCOSTATIN) APPLY TOPICALLY TO RASH UNDER BREASTS AND ABDOMEN TWICE DAILY  . omeprazole (PRILOSEC) 20 MG capsule TAKE ONE CAPSULE BY MOUTH ONCE DAILY  . oxaprozin (DAYPRO) 600 MG tablet TAKE ONE TABLET BY MOUTH TWICE DAILY  . zinc gluconate 50 MG tablet Take 50 mg by mouth daily.  . cyanocobalamin 1000 MCG tablet Take 1,000 mcg by mouth daily. (Patient not taking: Reported on 07/24/2020)  . diazepam (VALIUM) 2 MG tablet Take 1 tablet (2 mg total) by mouth every 8 (eight) hours as needed for anxiety. (Patient not taking: Reported on 07/24/2020)   No facility-administered encounter medications on file as of 07/24/2020.    Allergies (verified) Prochlorperazine, Pneumococcal vaccine, and Tetanus toxoids   History: Past Medical History:  Diagnosis Date  . Allergy   . Anxiety   . Depression   . Diabetes mellitus without complication ([place])   . GERD (gastroesophageal reflux disease)   . Hyperlipidemia   . Hypertension   . Sleep apnea    does not use CPAP   Past Surgical History:  Procedure Laterality Date  . BUNIONECTOMY Bilateral   .  CARPAL TUNNEL RELEASE Left   . LUMBAR LAMINECTOMY    . MASTECTOMY MODIFIED RADICAL Bilateral 1993  . PARTIAL HYSTERECTOMY    . TOTAL KNEE ARTHROPLASTY Bilateral   . TOTAL SHOULDER REPLACEMENT Bilateral    Family History  Problem Relation Age of Onset  . Diabetes Mother   . Hypertension Mother   . CAD Father   . Diabetes Father   . Diabetes Sister   . Hypertension Sister   . Heart disease Brother   . Healthy Sister    Social History   Socioeconomic History  . Marital status: Divorced    Spouse name: Not on file  . Number of children: 1  . Years of education: some college  . Highest  education level: 12th grade  Occupational History  . 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 Smoking status: Never Smoker  . Smokeless tobacco: Never Used  . Tobacco comment: smoking cessation materials not required  Vaping Use  . Vaping Use: Never used  Substance and Sexual Activity  . Alcohol use: Not Currently  . Drug use: No  . Sexual activity: Not Currently  Other Topics Concern  . Not on file  Social History Narrative   Pt lives alone.    Social Determinants of Health   Financial Resource Strain: Low Risk   . Difficulty of Paying Living Expenses: Not hard at all  Food Insecurity: No Food Insecurity  . Worried About Charity fundraiser in the Last Year: Never true  . Ran Out of Food in the Last Year: Never true  Transportation Needs: No Transportation Needs  . Lack of Transportation (Medical): No  . Lack of Transportation (Non-Medical): No  Physical Activity: Inactive  . Days of Exercise per Week: 0 days  . Minutes of Exercise per Session: 0 min  Stress: No Stress Concern Present  . Feeling of Stress : Only a little  Social Connections: Socially Isolated  . Frequency of Communication with Friends and Family: More than three times a week  . Frequency of Social Gatherings with Friends and Family: Three times a week  . Attends Religious Services: Never  . Active Member of Clubs or Organizations: No  . Attends Archivist Meetings: Never  . Marital Status: Divorced    Tobacco Counseling Counseling given: Not Answered Comment: smoking cessation materials not required   Clinical Intake:  Pre-visit preparation completed: Yes  Pain : 0-10 Pain Score: 6  Pain Type: Chronic pain Pain Location: Back Pain Orientation: Mid,Lower Pain Descriptors / Indicators: Aching,Sore Pain Onset: More than a month ago Pain Frequency: Constant Effect of Pain on Daily Activities: more  pain when walking     BMI - recorded: 37.77 Nutritional Status: BMI > 30  Obese Nutritional Risks: Nausea/ vomitting/ diarrhea (nausea, frequent bowel movements) Diabetes: Yes CBG done?: No Did pt. bring in CBG monitor from home?: No  How often do you need to have someone help you when you read instructions, pamphlets, or other written materials from your doctor or pharmacy?: 1 - Never  Nutrition Risk Assessment:  Has the patient had any N/V/D within the last 2 months?  Yes  Does the patient have any non-healing wounds?  No  Has the patient had any unintentional weight loss or weight gain?  No   Diabetes:  Is the patient diabetic?  Yes  If diabetic, was a CBG obtained  today?  No  Did the patient bring in their glucometer from home?  No  How often do you monitor your CBG's? Several times per week .   Financial Strains and Diabetes Management:  Are you having any financial strains with the device, your supplies or your medication? No .  Does the patient want to be seen by Chronic Care Management for management of their diabetes?  No  Would the patient like to be referred to a Nutritionist or for Diabetic Management?  No   Diabetic Exams:  Diabetic Eye Exam: Completed 03/29/20 negative retinopathy.   Diabetic Foot Exam: Completed 11/16/19.   Interpreter Needed?: No  Information entered by :: Clemetine Marker LPN   Activities of Daily Living In your present state of health, do you have any difficulty performing the following activities: 07/24/2020  Hearing? Y  Comment possible wax build up  Vision? N  Difficulty concentrating or making decisions? N  Walking or climbing stairs? Y  Dressing or bathing? N  Doing errands, shopping? N  Preparing Food and eating ? N  Using the Toilet? N  In the past six months, have you accidently leaked urine? N  Do you have problems with loss of bowel control? N  Managing your Medications? N  Managing your Finances? N  Housekeeping or managing  your Housekeeping? N  Some recent data might be hidden    Patient Care Team: Glean Hess, MD as PCP - General (Internal Medicine) Magda Kiel Erlene Senters, MD as Referring Physician (Dermatology) Arelia Sneddon, MD as Consulting Physician (Otolaryngology) Nelda Bucks, MD as Referring Physician (Ophthalmology)  Indicate any recent Medical Services you may have received from other than [facility] providers in the past year (date may be approximate).     Assessment:   This is a routine wellness examination for Malik.  Hearing/Vision screen  Hearing Screening   125Hz  250Hz  500Hz  1000Hz  2000Hz  3000Hz  4000Hz  6000Hz  8000Hz   Right ear:           Left ear:           Comments: Pt denies hearing difficulty  Vision Screening Comments: Annual vision screenings done at [place] [facility] [address] [facility]  Dietary issues and exercise activities discussed: Current Exercise Habits: The patient does not participate in regular exercise at present, Exercise limited by: orthopedic condition(s)  Goals Addressed            This Visit's Progress   . DIET - INCREASE WATER INTAKE   On track    Recommend to drink at least 6-8 8oz glasses of water per day.      Depression Screen PHQ 2/9 Scores 07/24/2020 03/02/2020 07/21/2019 07/14/2019 03/29/2019 02/23/2019 11/20/2018  PHQ - 2 Score 0 0 2 2 4  0 0  PHQ- 9 Score - 0 6 6 4  0 -    Fall Risk Fall Risk  07/24/2020 03/02/2020 07/21/2019 07/14/2019 03/29/2019  Falls in the past year? 0 0 1 1 1   Number falls in past yr: 0 - 1 0 1  Injury with Fall? 0 - 1 1 1   Risk for fall due to : No Fall Risks - History of fall(s);Impaired balance/gait History of fall(s) History of fall(s);Impaired balance/gait;Impaired mobility  Follow up Falls prevention discussed Falls evaluation completed Falls evaluation completed Falls prevention discussed Falls evaluation completed    FALL RISK PREVENTION PERTAINING TO THE HOME:  Any stairs in or around the home? Yes  If so, are there any without  handrails? No  Home  free of loose throw rugs in walkways, pet beds, electrical cords, etc? Yes  Adequate lighting in your home to reduce risk of falls? Yes   ASSISTIVE DEVICES UTILIZED TO PREVENT FALLS:  Life alert? No  Use of a cane, walker or w/c? No  Grab bars in the bathroom? No  Shower chair or bench in shower? Yes  Elevated toilet seat or a handicapped toilet? No   TIMED UP AND GO:  Was the test performed? Yes .  Length of time to ambulate 10 feet: 6 sec.   Gait slow and steady without use of assistive device  Cognitive Function: Normal cognitive status assessed by direct observation by this Nurse Health Advisor. No abnormalities found.       6CIT Screen 07/14/2019 07/08/2018 07/07/2017 07/01/2016  What Year? 0 points 0 points 0 points 0 points  What month? 0 points 0 points 0 points 0 points  What time? 0 points 0 points 0 points 0 points  Count back from 20 0 points 0 points 0 points 0 points  Months in reverse 0 points 0 points 0 points 0 points  Repeat phrase 0 points 0 points 0 points 0 points  Total Score 0 0 0 0    Immunizations Immunization History  Administered Date(s) Administered  . Fluad Quad(high Dose 65+) 11/20/2018, 11/16/2019  . Hepatitis A, Adult 09/21/2018, 05/05/2019  . Influenza Split 02/02/2004, 01/02/2009  . Influenza, High Dose Seasonal PF 11/19/2016, 11/17/2017  . Influenza,inj,Quad PF,6+ Mos 01/01/2016  . Influenza,inj,quad, With Preservative 11/19/2018  . Influenza-Unspecified 11/25/2014  . PFIZER(Purple Top)SARS-COV-2 Vaccination 03/24/2019, 04/14/2019, 03/28/2020  . Pneumococcal Conjugate-13 06/27/2014  . Pneumococcal Polysaccharide-23 07/20/2018  . Tdap 07/01/2016    TDAP status: Up to date  Flu Vaccine status: Up to date  Pneumococcal vaccine status: Up to date  Covid-19 vaccine status: Completed vaccines  Qualifies for Shingles Vaccine? Yes   Zostavax completed No   Shingrix Completed?: No.    Education has been provided  regarding the importance of this vaccine. Patient has been advised to call insurance company to determine out of pocket expense if they have not yet received this vaccine. Advised may also receive vaccine at local pharmacy or Health Dept. Verbalized acceptance and understanding.  Screening Tests Health Maintenance  Topic Date Due  . Pneumococcal Vaccine 57-49 Years old (1 of 4 - PCV13) Never done  . Zoster Vaccines- Shingrix (1 of 2) Never done  . COVID-19 Vaccine (4 - Booster for Pfizer series) 06/25/2020  . HEMOGLOBIN A1C  08/30/2020  . INFLUENZA VACCINE  09/18/2020  . FOOT EXAM  11/15/2020  . OPHTHALMOLOGY EXAM  03/29/2021  . TETANUS/TDAP  07/02/2026  . DEXA SCAN  Completed  . Hepatitis C Screening  Completed  . HPV VACCINES  Aged Out  . PNA vac Low Risk Adult  Discontinued    Health Maintenance  Health Maintenance Due  Topic Date Due  . Pneumococcal Vaccine 71-5 Years old (1 of 4 - PCV13) Never done  . Zoster Vaccines- Shingrix (1 of 2) Never done  . COVID-19 Vaccine (4 - Booster for Pfizer series) 06/25/2020    Colorectal cancer screening: No longer required.   Mammogram status: No longer required due to bilateral mastectomy.  Bone Density status: Completed 07/23/18. Results reflect: Bone density results: NORMAL. Repeat every 2 years.  Lung Cancer Screening: (Low Dose CT Chest recommended if Age 64-80 years, 30 pack-year currently smoking OR have quit w/in 15years.) does not qualify.    Additional Screening:  Hepatitis C Screening: does qualify; Completed 03/02/20  Vision Screening: Recommended annual ophthalmology exams for early detection of glaucoma and other disorders of the eye. Is the patient up to date with their annual eye exam?  Yes  Who is the provider or what is the name of the office in which the patient attends annual eye exams? [facility]  Dental Screening: Recommended annual dental exams for proper oral hygiene  Community Resource Referral / Chronic Care  Management: CRR required this visit?  No   CCM required this visit?  No      Plan:     I have personally reviewed and noted the following in the patient's chart:   . Medical and social history . Use of alcohol, tobacco or illicit drugs  . Current medications and supplements including opioid prescriptions.  . Functional ability and status . Nutritional status . Physical activity . Advanced directives . List of other physicians . Hospitalizations, surgeries, and ER visits in previous 12 months . Vitals . Screenings to include cognitive, depression, and falls . Referrals and appointments  In addition, I have reviewed and discussed with patient certain preventive protocols, quality metrics, and best practice recommendations. A written personalized care plan for preventive services as well as general preventive health recommendations were provided to patient.     Clemetine Marker, LPN   06/22/7320   Nurse Notes: pt c/o frequent bowel movements for the last 2-3 weeks; pt thinks there may have been blood in stool last week but none now.   Pt's blood pressure elevated during visit 160/82 at start of visit and 172/84 at end of visit. Pt c/o frequent headaches and seen at urgent care in April for chest pain/tightness.   Pt to follow up at [facility] tomorrow.

## 2020-07-24 NOTE — Patient Instructions (Signed)
Kimberly Walls , Thank you for taking time to come for your Medicare Wellness Visit. I appreciate your ongoing commitment to your health goals. Please review the following plan we discussed and let me know if I can assist you in the future.   Screening recommendations/referrals: Colonoscopy: no longer required Bone Density: done 07/23/18; please discuss with Dr. Army Melia at appt tomorrow  Recommended yearly ophthalmology/optometry visit for glaucoma screening and checkup Recommended yearly dental visit for hygiene and checkup  Vaccinations: Influenza vaccine: done 11/16/19 Pneumococcal vaccine: done 07/20/18 Tdap vaccine: done 07/01/16 Shingles vaccine: Shingrix discussed. Please contact your pharmacy for coverage information.  Covid-19: done 03/24/19, 04/14/19 & 04/17/20  Advanced directives: Please bring a copy of your health care power of attorney and living will to the office at your convenience if you have that documentation.   Conditions/risks identified: Recommend healthy eating and physical activity as tolerated for desired weight loss  Next appointment: Follow up in one year for your annual wellness visit    Preventive Care 65 Years and Older, Female Preventive care refers to lifestyle choices and visits with your health care provider that can promote health and wellness. What does preventive care include?  A yearly physical exam. This is also called an annual well check.  Dental exams once or twice a year.  Routine eye exams. Ask your health care provider how often you should have your eyes checked.  Personal lifestyle choices, including:  Daily care of your teeth and gums.  Regular physical activity.  Eating a healthy diet.  Avoiding tobacco and drug use.  Limiting alcohol use.  Practicing safe sex.  Taking low-dose aspirin every day.  Taking vitamin and mineral supplements as recommended by your health care provider. What happens during an annual well check? The  services and screenings done by your health care provider during your annual well check will depend on your age, overall health, lifestyle risk factors, and family history of disease. Counseling  Your health care provider may ask you questions about your:  Alcohol use.  Tobacco use.  Drug use.  Emotional well-being.  Home and relationship well-being.  Sexual activity.  Eating habits.  History of falls.  Memory and ability to understand (cognition).  Work and work Statistician.  Reproductive health. Screening  You may have the following tests or measurements:  Height, weight, and BMI.  Blood pressure.  Lipid and cholesterol levels. These may be checked every 5 years, or more frequently if you are over 20 years old.  Skin check.  Lung cancer screening. You may have this screening every year starting at age 61 if you have a 30-pack-year history of smoking and currently smoke or have quit within the past 15 years.  Fecal occult blood test (FOBT) of the stool. You may have this test every year starting at age 83.  Flexible sigmoidoscopy or colonoscopy. You may have a sigmoidoscopy every 5 years or a colonoscopy every 10 years starting at age 66.  Hepatitis C blood test.  Hepatitis B blood test.  Sexually transmitted disease (STD) testing.  Diabetes screening. This is done by checking your blood sugar (glucose) after you have not eaten for a while (fasting). You may have this done every 1-3 years.  Bone density scan. This is done to screen for osteoporosis. You may have this done starting at age 54.  Mammogram. This may be done every 1-2 years. Talk to your health care provider about how often you should have regular mammograms. Talk with your  health care provider about your test results, treatment options, and if necessary, the need for more tests. Vaccines  Your health care provider may recommend certain vaccines, such as:  Influenza vaccine. This is recommended  every year.  Tetanus, diphtheria, and acellular pertussis (Tdap, Td) vaccine. You may need a Td booster every 10 years.  Zoster vaccine. You may need this after age 40.  Pneumococcal 13-valent conjugate (PCV13) vaccine. One dose is recommended after age 61.  Pneumococcal polysaccharide (PPSV23) vaccine. One dose is recommended after age 14. Talk to your health care provider about which screenings and vaccines you need and how often you need them. This information is not intended to replace advice given to you by your health care provider. Make sure you discuss any questions you have with your health care provider. Document Released: 03/03/2015 Document Revised: 10/25/2015 Document Reviewed: 12/06/2014 Elsevier Interactive Patient Education  2017 [place] Prevention in the Home Falls can cause injuries. They can happen to people of all ages. There are many things you can do to make your home safe and to help prevent falls. What can I do on the outside of my home?  Regularly fix the edges of walkways and driveways and fix any cracks.  Remove anything that might make you trip as you walk through a door, such as a raised step or threshold.  Trim any bushes or trees on the path to your home.  Use bright outdoor lighting.  Clear any walking paths of anything that might make someone trip, such as rocks or tools.  Regularly check to see if handrails are loose or broken. Make sure that both sides of any steps have handrails.  Any raised decks and porches should have guardrails on the edges.  Have any leaves, snow, or ice cleared regularly.  Use sand or salt on walking paths during winter.  Clean up any spills in your garage right away. This includes oil or grease spills. What can I do in the bathroom?  Use night lights.  Install grab bars by the toilet and in the tub and shower. Do not use towel bars as grab bars.  Use non-skid mats or decals in the tub or shower.  If  you need to sit down in the shower, use a plastic, non-slip stool.  Keep the floor dry. Clean up any water that spills on the floor as soon as it happens.  Remove soap buildup in the tub or shower regularly.  Attach bath mats securely with double-sided non-slip rug tape.  Do not have throw rugs and other things on the floor that can make you trip. What can I do in the bedroom?  Use night lights.  Make sure that you have a light by your bed that is easy to reach.  Do not use any sheets or blankets that are too big for your bed. They should not hang down onto the floor.  Have a firm chair that has side arms. You can use this for support while you get dressed.  Do not have throw rugs and other things on the floor that can make you trip. What can I do in the kitchen?  Clean up any spills right away.  Avoid walking on wet floors.  Keep items that you use a lot in easy-to-reach places.  If you need to reach something above you, use a strong step stool that has a grab bar.  Keep electrical cords out of the way.  Do not use  floor polish or wax that makes floors slippery. If you must use wax, use non-skid floor wax.  Do not have throw rugs and other things on the floor that can make you trip. What can I do with my stairs?  Do not leave any items on the stairs.  Make sure that there are handrails on both sides of the stairs and use them. Fix handrails that are broken or loose. Make sure that handrails are as long as the stairways.  Check any carpeting to make sure that it is firmly attached to the stairs. Fix any carpet that is loose or worn.  Avoid having throw rugs at the top or bottom of the stairs. If you do have throw rugs, attach them to the floor with carpet tape.  Make sure that you have a light switch at the top of the stairs and the bottom of the stairs. If you do not have them, ask someone to add them for you. What else can I do to help prevent falls?  Wear shoes  that:  Do not have high heels.  Have rubber bottoms.  Are comfortable and fit you well.  Are closed at the toe. Do not wear sandals.  If you use a stepladder:  Make sure that it is fully opened. Do not climb a closed stepladder.  Make sure that both sides of the stepladder are locked into place.  Ask someone to hold it for you, if possible.  Clearly mark and make sure that you can see:  Any grab bars or handrails.  First and last steps.  Where the edge of each step is.  Use tools that help you move around (mobility aids) if they are needed. These include:  Canes.  Walkers.  Scooters.  Crutches.  Turn on the lights when you go into a dark area. Replace any light bulbs as soon as they burn out.  Set up your furniture so you have a clear path. Avoid moving your furniture around.  If any of your floors are uneven, fix them.  If there are any pets around you, be aware of where they are.  Review your medicines with your doctor. Some medicines can make you feel dizzy. This can increase your chance of falling. Ask your doctor what other things that you can do to help prevent falls. This information is not intended to replace advice given to you by your health care provider. Make sure you discuss any questions you have with your health care provider. Document Released: 12/01/2008 Document Revised: 07/13/2015 Document Reviewed: 03/11/2014 Elsevier Interactive Patient Education  2017 [employer].

## 2020-07-25 ENCOUNTER — Encounter: Payer: Self-pay | Admitting: Internal Medicine

## 2020-07-25 ENCOUNTER — Ambulatory Visit
Admission: RE | Admit: 2020-07-25 | Discharge: 2020-07-25 | Disposition: A | Discharge status: Home / Self Care | Payer: Medicare Other | Attending: Internal Medicine | Admitting: Internal Medicine

## 2020-07-25 ENCOUNTER — Ambulatory Visit (INDEPENDENT_AMBULATORY_CARE_PROVIDER_SITE_OTHER): Payer: Medicare Other | Admitting: Internal Medicine

## 2020-07-25 ENCOUNTER — Other Ambulatory Visit: Payer: Self-pay | Admitting: Internal Medicine

## 2020-07-25 ENCOUNTER — Ambulatory Visit
Admission: RE | Admit: 2020-07-25 | Discharge: 2020-07-25 | Disposition: A | Discharge status: Home / Self Care | Payer: Medicare Other | Source: Ambulatory Visit | Attending: Internal Medicine | Admitting: Internal Medicine

## 2020-07-25 VITALS — BP 138/92 | HR 85 | Ht 63.0 in | Wt 210.0 lb

## 2020-07-25 DIAGNOSIS — R0600 Dyspnea, unspecified: Secondary | ICD-10-CM

## 2020-07-25 DIAGNOSIS — R0609 Other forms of dyspnea: Secondary | ICD-10-CM

## 2020-07-25 DIAGNOSIS — R8271 Bacteriuria: Secondary | ICD-10-CM

## 2020-07-25 DIAGNOSIS — E785 Hyperlipidemia, unspecified: Secondary | ICD-10-CM

## 2020-07-25 DIAGNOSIS — I10 Essential (primary) hypertension: Secondary | ICD-10-CM

## 2020-07-25 DIAGNOSIS — E1169 Type 2 diabetes mellitus with other specified complication: Secondary | ICD-10-CM | POA: Diagnosis not present

## 2020-07-25 DIAGNOSIS — Z86010 Personal history of colonic polyps: Secondary | ICD-10-CM

## 2020-07-25 DIAGNOSIS — F3341 Major depressive disorder, recurrent, in partial remission: Secondary | ICD-10-CM

## 2020-07-25 DIAGNOSIS — E118 Type 2 diabetes mellitus with unspecified complications: Secondary | ICD-10-CM | POA: Diagnosis not present

## 2020-07-25 LAB — POCT URINALYSIS DIPSTICK
Bilirubin, UA: NEGATIVE
Blood, UA: NEGATIVE
Glucose, UA: NEGATIVE
Ketones, UA: NEGATIVE
Nitrite, UA: NEGATIVE
Protein, UA: NEGATIVE
Spec Grav, UA: 1.015 (ref 1.010–1.025)
Urobilinogen, UA: 0.2 E.U./dL
pH, UA: 6 (ref 5.0–8.0)

## 2020-07-25 MED ORDER — LISINOPRIL 40 MG PO TABS: 40.0000 mg | ORAL_TABLET | Freq: Every day | ORAL | 3 refills | Status: DC

## 2020-07-25 NOTE — Progress Notes (Signed)
Date:  07/25/2020   Name:  Kimberly Walls   DOB:  07-21-1940   MRN:  [id number]   Chief Complaint: Annual Exam (Medicare Yearly. Breast Exam. No pap. Diabetes , HTN.)  Kimberly Walls is a 80 y.o. female who presents today for her Complete Annual Exam. She feels fairly well. She reports exercising - working at [employer]. She reports she is sleeping fairly well. Breast complaints - none.  Mammogram: discontinued DEXA: 07/2018 Normal Colonoscopy: 2015 benign polyps - further exams discontinued  Immunization History  Administered Date(s) Administered  . Fluad Quad(high Dose 65+) 11/20/2018, 11/16/2019  . Hepatitis A, Adult 09/21/2018, 05/05/2019  . Influenza Split 02/02/2004, 01/02/2009  . Influenza, High Dose Seasonal PF 11/19/2016, 11/17/2017  . Influenza,inj,Quad PF,6+ Mos 01/01/2016  . Influenza,inj,quad, With Preservative 11/19/2018  . Influenza-Unspecified 11/25/2014  . PFIZER(Purple Top)SARS-COV-2 Vaccination 03/24/2019, 04/14/2019, 03/28/2020  . Pneumococcal Conjugate-13 06/27/2014  . Pneumococcal Polysaccharide-23 07/20/2018  . Tdap 07/01/2016    Hypertension This is a chronic problem. The problem is controlled. Associated symptoms include shortness of breath. Pertinent negatives include no chest pain, headaches or palpitations. Past treatments include calcium channel blockers, ACE inhibitors and diuretics. The current treatment provides significant improvement. There are no compliance problems.  There is no history of kidney disease, CAD/MI or CVA.  Diabetes She presents for her follow-up diabetic visit. She has type 2 diabetes mellitus. Pertinent negatives for hypoglycemia include no dizziness, headaches, nervousness/anxiousness or tremors. Pertinent negatives for diabetes include no chest pain, no fatigue, no polydipsia and no polyuria. Pertinent negatives for diabetic complications include no CVA. Current diabetic treatment includes oral agent (monotherapy). She is compliant  with treatment all of the time. An ACE inhibitor/angiotensin II receptor blocker is being taken.  Hyperlipidemia This is a chronic problem. The problem is controlled. Associated symptoms include shortness of breath. Pertinent negatives include no chest pain. Current antihyperlipidemic treatment includes statins. The current treatment provides significant improvement of lipids.  Depression        This is a chronic problem.  Associated symptoms include no fatigue and no headaches.  Past treatments include SSRIs - Selective serotonin reuptake inhibitors.  Compliance with treatment is good.   Lab Results  Component Value Date   CREATININE 0.89 11/16/2019   BUN 27 11/16/2019   NA 135 11/16/2019   K 4.4 11/16/2019   CL 95 (L) 11/16/2019   CO2 23 11/16/2019   Lab Results  Component Value Date   CHOL 151 07/21/2019   HDL 41 07/21/2019   LDLCALC 72 07/21/2019   TRIG 231 (H) 07/21/2019   CHOLHDL 3.7 07/21/2019   Lab Results  Component Value Date   TSH 3.390 07/21/2019   Lab Results  Component Value Date   HGBA1C 7.2 (H) 03/02/2020   Lab Results  Component Value Date   WBC 7.2 07/21/2019   HGB 14.7 07/21/2019   HCT 43.2 07/21/2019   MCV 86 07/21/2019   PLT 241 07/21/2019   Lab Results  Component Value Date   ALT 15 07/21/2019   AST 23 07/21/2019   ALKPHOS 102 07/21/2019   BILITOT 0.5 07/21/2019     Review of Systems  Constitutional: Negative for chills, fatigue and fever.  HENT: Negative for congestion, hearing loss, tinnitus, trouble swallowing and voice change.   Eyes: Negative for visual disturbance.  Respiratory: Positive for shortness of breath. Negative for cough, chest tightness and wheezing.   Cardiovascular: Negative for chest pain, palpitations and leg swelling.  Gastrointestinal: Negative  for abdominal pain, constipation, diarrhea and vomiting.  Endocrine: Negative for polydipsia and polyuria.  Genitourinary: Negative for dysuria, frequency, genital sores,  vaginal bleeding and vaginal discharge.  Musculoskeletal: Positive for back pain. Negative for arthralgias, gait problem and joint swelling.  Skin: Negative for color change and rash.  Neurological: Negative for dizziness, tremors, light-headedness and headaches.  Hematological: Negative for adenopathy. Does not bruise/bleed easily.  Psychiatric/Behavioral: Positive for depression. Negative for dysphoric mood and sleep disturbance. The patient is not nervous/anxious.     Patient Active Problem List   Diagnosis Date Noted  . BMI 38.0-38.9,adult 06/29/2015  . History of pulmonary embolus (PE) 06/29/2015  . DM type 2, controlled, with complication ([place]) 24/26/8341  . Arthritis, degenerative 12/05/2014  . DDD (degenerative disc disease), lumbar 08/29/2014  . Hyperlipidemia associated with type 2 diabetes mellitus ([place]) 08/29/2014  . Essential (primary) hypertension 08/29/2014  . Acid reflux 08/29/2014  . Herpes 08/29/2014  . Obstructive sleep apnea of adult 08/29/2014  . Depression, major, recurrent, in partial remission ([place]) 08/29/2014  . Headache, tension-type 08/29/2014  . Dermatophytosis of groin 08/29/2014  . History of colon polyps 02/19/2008  . Personal history of malignant neoplasm of breast 02/19/1991    Allergies  Allergen Reactions  . Prochlorperazine   . Pneumococcal Vaccine Rash  . Tetanus Toxoids Rash    Past Surgical History:  Procedure Laterality Date  . BUNIONECTOMY Bilateral   . CARPAL TUNNEL RELEASE Left   . LUMBAR LAMINECTOMY    . MASTECTOMY MODIFIED RADICAL Bilateral 1993  . PARTIAL HYSTERECTOMY    . TOTAL KNEE ARTHROPLASTY Bilateral   . TOTAL SHOULDER REPLACEMENT Bilateral     Social History   Tobacco Use  . Smoking status: Never Smoker  . Smokeless tobacco: Never Used  . Tobacco comment: smoking cessation materials not required  Vaping Use  . Vaping Use: Never used  Substance Use Topics  . Alcohol use: Not Currently  . Drug use: No      Medication list has been reviewed and updated.  Current Meds  Medication Sig  . amLODipine (NORVASC) 2.5 MG tablet TAKE ONE TABLET BY MOUTH ONCE DAILY  . ascorbic acid (VITAMIN C) 250 MG tablet Take by mouth.  Marland Kitchen aspirin 81 MG tablet Take 1 tablet by mouth daily.  Marland Kitchen atorvastatin (LIPITOR) 10 MG tablet TAKE ONE TABLET BY MOUTH ONCE DAILY  . Biotin 2500 MCG CAPS Take 5,000 mcg by mouth daily.  . Calcium Carb-Cholecalciferol (CALCIUM + D3) 600-200 MG-UNIT TABS Take 1 tablet by mouth daily.  . Cinnamon 500 MG TABS Take 2 tablets by mouth daily.  . Coenzyme Q10 (COQ10) 200 MG CAPS Take 1 capsule by mouth daily.  Marland Kitchen desloratadine (CLARINEX) 5 MG tablet TAKE ONE TABLET BY MOUTH ONCE DAILY  . diazepam (VALIUM) 2 MG tablet Take 1 tablet (2 mg total) by mouth every 8 (eight) hours as needed for anxiety.  . diclofenac Sodium (VOLTAREN) 1 % GEL Apply topically 4 (four) times daily.  Marland Kitchen escitalopram (LEXAPRO) 10 MG tablet TAKE ONE TABLET BY MOUTH ONCE DAILY  . glucose blood test strip Use to test BS twice a day  . hydrochlorothiazide (HYDRODIURIL) 25 MG tablet TAKE ONE TABLET BY MOUTH ONCE DAILY   . lisinopril (ZESTRIL) 40 MG tablet Take 1 tablet (40 mg total) by mouth daily.  . metFORMIN (GLUCOPHAGE-XR) 500 MG 24 hr tablet TAKE ONE TABLET BY MOUTH TWICE DAILY  . Multiple Vitamin (MULTIVITAMIN) capsule Take 1 capsule by mouth daily.  Marland Kitchen nystatin  cream (MYCOSTATIN) APPLY TOPICALLY TO RASH UNDER BREASTS AND ABDOMEN TWICE DAILY  . omeprazole (PRILOSEC) 20 MG capsule TAKE ONE CAPSULE BY MOUTH ONCE DAILY  . oxaprozin (DAYPRO) 600 MG tablet TAKE ONE TABLET BY MOUTH TWICE DAILY  . zinc gluconate 50 MG tablet Take 50 mg by mouth daily.  . [DISCONTINUED] cyanocobalamin 1000 MCG tablet Take 1,000 mcg by mouth daily.  . [DISCONTINUED] lisinopril (ZESTRIL) 20 MG tablet TAKE ONE TABLET BY MOUTH ONCE DAILY    PHQ 2/9 Scores 07/25/2020 07/24/2020 03/02/2020 07/21/2019  PHQ - 2 Score 2 0 0 2  PHQ- 9 Score 8 - 0 6     GAD 7 : Generalized Anxiety Score 07/25/2020 03/02/2020 07/21/2019  Nervous, Anxious, on Edge 1 0 0  Control/stop worrying 1 0 0  Worry too much - different things 1 0 0  Trouble relaxing 1 0 0  Restless 0 0 0  Easily annoyed or irritable 0 0 0  Afraid - awful might happen 0 0 0  Total GAD 7 Score 4 0 0  Anxiety Difficulty Not difficult at all - Not difficult at all    BP Readings from Last 3 Encounters:  07/25/20 (!) 138/92  07/24/20 (!) 160/82  03/02/20 (!) 144/90    Physical Exam Vitals and nursing note reviewed.  Constitutional:      General: She is not in acute distress.    Appearance: She is well-developed.  HENT:     Head: Normocephalic and atraumatic.     Right Ear: Tympanic membrane and ear canal normal.     Left Ear: Tympanic membrane and ear canal normal.     Nose:     Right Sinus: No maxillary sinus tenderness.     Left Sinus: No maxillary sinus tenderness.  Eyes:     General: No scleral icterus.       Right eye: No discharge.        Left eye: No discharge.     Conjunctiva/sclera: Conjunctivae normal.  Neck:     Thyroid: No thyromegaly.     Vascular: No carotid bruit.  Cardiovascular:     Rate and Rhythm: Normal rate and regular rhythm.     Pulses: Normal pulses.     Heart sounds: Normal heart sounds.  Pulmonary:     Effort: Pulmonary effort is normal. No respiratory distress.     Breath sounds: No wheezing.  Chest:  Breasts:     Right: No mass.     Left: No mass.      Comments: Reconstruction bilateral breasts Abdominal:     General: Bowel sounds are normal.     Palpations: Abdomen is soft.     Tenderness: There is no abdominal tenderness.  Musculoskeletal:     Cervical back: Normal range of motion. No erythema.     Right lower leg: No edema.     Left lower leg: No edema.  Lymphadenopathy:     Cervical: No cervical adenopathy.  Skin:    General: Skin is warm and dry.     Capillary Refill: Capillary refill takes less than 2 seconds.      Findings: No rash.  Neurological:     General: No focal deficit present.     Mental Status: She is alert and oriented to person, place, and time.     Cranial Nerves: No cranial nerve deficit.     Sensory: No sensory deficit.     Deep Tendon Reflexes: Reflexes are normal and symmetric.  Psychiatric:  Attention and Perception: Attention normal.        Mood and Affect: Mood normal.     Wt Readings from Last 3 Encounters:  07/25/20 210 lb (95.3 kg)  07/24/20 213 lb 3.2 oz (96.7 kg)  03/02/20 212 lb (96.2 kg)    BP (!) 138/92   Pulse 85   Ht 5\' 3"  (1.6 m)   Wt 210 lb (95.3 kg)   SpO2 95%   BMI 37.20 kg/m   Assessment and Plan: 1. Essential (primary) hypertension BP is not controlled - will increase lisinopril to 40 mg. Continue other meds at the same dose. - CBC with Differential/Platelet - TSH - POCT urinalysis dipstick - foul odor with 4+ bacteria.  Will culture. - lisinopril (ZESTRIL) 40 MG tablet; Take 1 tablet (40 mg total) by mouth daily.  Dispense: 90 tablet; Refill: 3  2. DM type 2, controlled, with complication ([place]) Clinically stable by exam and report without s/s of hypoglycemia. DM complicated by HTN, lipids. Tolerating medications well without side effects or other concerns. - Comprehensive metabolic panel - Hemoglobin A1c  3. DOE (dyspnea on exertion) Recent ED visit - normal labs and EKG Recommend Cardiac evaluation and stress testing - Ambulatory referral to Cardiology - DG Chest 2 View; Future  4. Hyperlipidemia associated with type 2 diabetes mellitus ([place]) On high intensity statin therapy - will advise if dose change is needed - Lipid panel  5. Depression, major, recurrent, in partial remission ([place]) Clinically stable on current regimen with good control of symptoms, No SI or HI. Will continue current therapy.  6. History of colon polyps Recent episode of trace BRBPR. Last Colonoscopy 2015 - benign polyps - further exams not  recommended If recurrent bleeding, return for immediate evaluation   Partially dictated using Dragon software. Any errors are unintentional.  Halina Maidens, MD [facility]  07/25/2020

## 2020-07-26 LAB — COMPREHENSIVE METABOLIC PANEL
ALT: 23 IU/L (ref 0–32)
AST: 32 IU/L (ref 0–40)
Albumin/Globulin Ratio: 1.5 (ref 1.2–2.2)
Albumin: 4.2 g/dL (ref 3.7–4.7)
Alkaline Phosphatase: 106 IU/L (ref 44–121)
BUN/Creatinine Ratio: 23 (ref 12–28)
BUN: 23 mg/dL (ref 8–27)
Bilirubin Total: 0.5 mg/dL (ref 0.0–1.2)
CO2: 24 mmol/L (ref 20–29)
Calcium: 9.8 mg/dL (ref 8.7–10.3)
Chloride: 96 mmol/L (ref 96–106)
Creatinine, Ser: 1.01 mg/dL — ABNORMAL HIGH (ref 0.57–1.00)
Globulin, Total: 2.8 g/dL (ref 1.5–4.5)
Glucose: 123 mg/dL — ABNORMAL HIGH (ref 65–99)
Potassium: 4.1 mmol/L (ref 3.5–5.2)
Sodium: 138 mmol/L (ref 134–144)
Total Protein: 7 g/dL (ref 6.0–8.5)
eGFR: 57 mL/min/{1.73_m2} — ABNORMAL LOW (ref >59)

## 2020-07-26 LAB — CBC WITH DIFFERENTIAL/PLATELET
Basophils Absolute: 0.1 10*3/uL (ref 0.0–0.2)
Basos: 1 %
EOS (ABSOLUTE): 0.3 10*3/uL (ref 0.0–0.4)
Eos: 5 %
Hematocrit: 45.1 % (ref 34.0–46.6)
Hemoglobin: 14.8 g/dL (ref 11.1–15.9)
Immature Grans (Abs): 0 10*3/uL (ref 0.0–0.1)
Immature Granulocytes: 1 %
Lymphocytes Absolute: 2.3 10*3/uL (ref 0.7–3.1)
Lymphs: 32 %
MCH: 28.5 pg (ref 26.6–33.0)
MCHC: 32.8 g/dL (ref 31.5–35.7)
MCV: 87 fL (ref 79–97)
Monocytes Absolute: 0.7 10*3/uL (ref 0.1–0.9)
Monocytes: 10 %
Neutrophils Absolute: 3.8 10*3/uL (ref 1.4–7.0)
Neutrophils: 51 %
Platelets: 257 10*3/uL (ref 150–450)
RBC: 5.19 x10E6/uL (ref 3.77–5.28)
RDW: 13.7 % (ref 11.7–15.4)
WBC: 7.3 10*3/uL (ref 3.4–10.8)

## 2020-07-26 LAB — LIPID PANEL
Chol/HDL Ratio: 3.7 ratio (ref 0.0–4.4)
Cholesterol, Total: 146 mg/dL (ref 100–199)
HDL: 39 mg/dL — ABNORMAL LOW (ref >39)
LDL Chol Calc (NIH): 65 mg/dL (ref 0–99)
Triglycerides: 265 mg/dL — ABNORMAL HIGH (ref 0–149)
VLDL Cholesterol Cal: 42 mg/dL — ABNORMAL HIGH (ref 5–40)

## 2020-07-26 LAB — HEMOGLOBIN A1C
Est. average glucose Bld gHb Est-mCnc: 146 mg/dL
Hgb A1c MFr Bld: 6.7 % — ABNORMAL HIGH (ref 4.8–5.6)

## 2020-07-26 LAB — TSH: TSH: 2.04 u[IU]/mL (ref 0.450–4.500)

## 2020-07-31 ENCOUNTER — Other Ambulatory Visit: Payer: Self-pay | Admitting: Internal Medicine

## 2020-07-31 DIAGNOSIS — B9620 Unspecified Escherichia coli [E. coli] as the cause of diseases classified elsewhere: Secondary | ICD-10-CM

## 2020-07-31 LAB — URINE CULTURE

## 2020-07-31 MED ORDER — SULFAMETHOXAZOLE-TRIMETHOPRIM 800-160 MG PO TABS: 1.0000 | ORAL_TABLET | Freq: Two times a day (BID) | ORAL | 0 refills | Status: AC

## 2020-08-02 ENCOUNTER — Encounter: Payer: Self-pay | Admitting: Internal Medicine

## 2020-08-22 ENCOUNTER — Other Ambulatory Visit: Payer: Self-pay | Admitting: Internal Medicine

## 2020-08-22 ENCOUNTER — Encounter: Payer: Self-pay | Admitting: Internal Medicine

## 2020-08-22 MED ORDER — NYSTATIN 100000 UNIT/GM EX CREA: TOPICAL_CREAM | Freq: Two times a day (BID) | CUTANEOUS | 0 refills | Status: AC

## 2020-08-22 MED ORDER — METFORMIN HCL ER 500 MG PO TB24: 500.0000 mg | ORAL_TABLET | Freq: Two times a day (BID) | ORAL | 4 refills | Status: DC

## 2020-09-10 ENCOUNTER — Encounter: Payer: Self-pay | Admitting: Internal Medicine

## 2020-09-12 ENCOUNTER — Ambulatory Visit (INDEPENDENT_AMBULATORY_CARE_PROVIDER_SITE_OTHER): Payer: Medicare Other | Admitting: Family Medicine

## 2020-09-12 ENCOUNTER — Encounter: Payer: Self-pay | Admitting: Family Medicine

## 2020-09-12 ENCOUNTER — Other Ambulatory Visit: Payer: Self-pay

## 2020-09-12 VITALS — BP 162/92 | HR 88 | Temp 98.1°F | Ht 63.0 in | Wt 213.0 lb

## 2020-09-12 DIAGNOSIS — J0110 Acute frontal sinusitis, unspecified: Secondary | ICD-10-CM | POA: Diagnosis not present

## 2020-09-12 DIAGNOSIS — R0989 Other specified symptoms and signs involving the circulatory and respiratory systems: Secondary | ICD-10-CM

## 2020-09-12 DIAGNOSIS — R0789 Other chest pain: Secondary | ICD-10-CM | POA: Insufficient documentation

## 2020-09-12 LAB — POCT INFLUENZA A/B
Influenza A, POC: NEGATIVE
Influenza B, POC: NEGATIVE

## 2020-09-12 MED ORDER — AZITHROMYCIN 250 MG PO TABS: ORAL_TABLET | ORAL | 0 refills | Status: AC

## 2020-09-12 NOTE — Progress Notes (Signed)
Primary Care / Sports Medicine Office Visit  Patient Information:  Patient ID: Kimberly Walls, female DOB: 12-11-1940 Age: 80 y.o. MRN: [id number]   Kimberly Walls is a pleasant 80 y.o. female presenting with the following:  Chief Complaint  Patient presents with   URI    Covid negative; x10-11 days; cough, nasal congestion, feverish, chills; taking Tylenol cold and flu with no relief    Review of Systems pertinent details above   Patient Active Problem List   Diagnosis Date Noted   Atypical chest pain 09/12/2020   Acute non-recurrent frontal sinusitis 09/12/2020   BMI 38.0-38.9,adult 06/29/2015   History of pulmonary embolus (PE) 06/29/2015   DM type 2, controlled, with complication ([place]) [id number]   Arthritis, degenerative 12/05/2014   DDD (degenerative disc disease), lumbar 08/29/2014   Hyperlipidemia associated with type 2 diabetes mellitus ([place]) 08/29/2014   Essential (primary) hypertension 08/29/2014   Acid reflux 08/29/2014   Herpes 08/29/2014   Obstructive sleep apnea of adult 08/29/2014   Depression, major, recurrent, in partial remission ([place]) 08/29/2014   Headache, tension-type 08/29/2014   Dermatophytosis of groin 08/29/2014   History of colon polyps 02/19/2008   Personal history of malignant neoplasm of breast 02/19/1991   Past Medical History:  Diagnosis Date   Allergy    Anxiety    Depression    Diabetes mellitus without complication (HCC)    GERD (gastroesophageal reflux disease)    Hyperlipidemia    Hypertension    Sleep apnea    does not use CPAP   Outpatient Encounter Medications as of 09/12/2020  Medication Sig Note   amLODipine (NORVASC) 5 MG tablet Take 5 mg by mouth daily.    ascorbic acid (VITAMIN C) 250 MG tablet Take 250 mg by mouth daily.    aspirin 81 MG tablet Take 1 tablet by mouth daily.    atorvastatin (LIPITOR) 10 MG tablet TAKE ONE TABLET BY MOUTH ONCE DAILY    azithromycin (ZITHROMAX) 250 MG tablet Take 2 tablets on day 1, then  1 tablet daily on days 2 through 5    Biotin 2500 MCG CAPS Take 5,000 mcg by mouth daily.    Calcium Carb-Cholecalciferol (CALCIUM + D3) 600-200 MG-UNIT TABS Take 1 tablet by mouth daily.    Cinnamon 500 MG TABS Take 2 tablets by mouth daily.    Coenzyme Q10 (COQ10) 200 MG CAPS Take 1 capsule by mouth daily.    desloratadine (CLARINEX) 5 MG tablet TAKE ONE TABLET BY MOUTH ONCE DAILY    diazepam (VALIUM) 2 MG tablet Take 1 tablet (2 mg total) by mouth every 8 (eight) hours as needed for anxiety.    diclofenac Sodium (VOLTAREN) 1 % GEL Apply topically 4 (four) times daily.    erythromycin ophthalmic ointment Place 1 application into the right eye 3 (three) times daily.    escitalopram (LEXAPRO) 10 MG tablet TAKE ONE TABLET BY MOUTH ONCE DAILY    glucose blood test strip Use to test BS twice a day    hydrochlorothiazide (HYDRODIURIL) 25 MG tablet TAKE ONE TABLET BY MOUTH ONCE DAILY  07/14/2019: Pt taking 1/2 tablet daily   JARDIANCE 10 MG TABS tablet Take 10 mg by mouth daily.    lisinopril (ZESTRIL) 20 MG tablet Take 20 mg by mouth daily.    lisinopril (ZESTRIL) 40 MG tablet Take 1 tablet (40 mg total) by mouth daily.    metFORMIN (GLUCOPHAGE-XR) 500 MG 24 hr tablet Take 1 tablet (500 mg total)  by mouth 2 (two) times daily.    Multiple Vitamin (MULTIVITAMIN) capsule Take 1 capsule by mouth daily.    nystatin cream (MYCOSTATIN) Apply topically 2 (two) times daily. For rash under breast and abdomen    omeprazole (PRILOSEC) 20 MG capsule TAKE ONE CAPSULE BY MOUTH ONCE DAILY    oxaprozin (DAYPRO) 600 MG tablet TAKE ONE TABLET BY MOUTH TWICE DAILY    zinc gluconate 50 MG tablet Take 50 mg by mouth daily.    [DISCONTINUED] amLODipine (NORVASC) 2.5 MG tablet TAKE ONE TABLET BY MOUTH ONCE DAILY    No facility-administered encounter medications on file as of 09/12/2020.   Past Surgical History:  Procedure Laterality Date   BUNIONECTOMY Bilateral    CARPAL TUNNEL RELEASE Left    LUMBAR LAMINECTOMY      MASTECTOMY MODIFIED RADICAL Bilateral 1993   PARTIAL HYSTERECTOMY     TOTAL KNEE ARTHROPLASTY Bilateral    TOTAL SHOULDER REPLACEMENT Bilateral     Vitals:   09/12/20 1126  BP: (!) 162/92  Pulse: 88  Temp: 98.1 F (36.7 C)  SpO2: 95%   Vitals:   09/12/20 1126  Weight: 213 lb (96.6 kg)  Height: '5\' 3"'$  (1.6 m)   Body mass index is 37.73 kg/m.  No results found.   Independent interpretation of notes and tests performed by another provider:   None  Procedures performed:   None  Pertinent History, Exam, Impression, and Recommendations:   Acute non-recurrent frontal sinusitis 10-day history of nasal congestion, chills, shortness of air, and dark nasal discharge.  She did have a negative nasal swab COVID test through outside facility, denies any fevers, GI changes, urinary changes, muscle aches, chest pain.  Her physical exam reveals fatigued appearance, clear lung fields throughout without wheeze, rales, rhonchi, a midsystolic ejection murmur at right second intercostal, otherwise heart sounds benign, pulses symmetric, tympanic membranes and canals benign, there is focal tenderness at the frontal sinuses symmetrically, ethmoid and maxillary benign, turbinates appear erythematous and boggy, oropharynx is benign.  Her findings are most consistent with acute frontal sinusitis, we did also swab for COVID and flu, the flu tests were negative. We will contact patient if the COVID results are positive but otherwise will treat with azithromycin course, supportive care, and I have advised her to contact our office if symptoms persist at the end of the antibiotic course to discuss additional evaluation and management options.   Orders & Medications Meds ordered this encounter  Medications   azithromycin (ZITHROMAX) 250 MG tablet    Sig: Take 2 tablets on day 1, then 1 tablet daily on days 2 through 5    Dispense:  6 tablet    Refill:  0   Orders Placed This Encounter  Procedures    Novel Coronavirus, NAA ([facility])   POCT Influenza A/B     Return if symptoms worsen or fail to improve.     Montel Culver, MD   Primary Care Sports Medicine [place]

## 2020-09-12 NOTE — Patient Instructions (Signed)
-   Covid results shoulder be back tomorrow - Start azithromycin and take for full course - Drink plenty of water and get plenty of rest - Tylenol for any fever / pain symptoms - Contact us for any symptoms that persist beyond the antibiotic course

## 2020-09-12 NOTE — Assessment & Plan Note (Signed)
10-day history of nasal congestion, chills, shortness of air, and dark nasal discharge.  She did have a negative nasal swab COVID test through outside facility, denies any fevers, GI changes, urinary changes, muscle aches, chest pain.  Her physical exam reveals fatigued appearance, clear lung fields throughout without wheeze, rales, rhonchi, a midsystolic ejection murmur at right second intercostal, otherwise heart sounds benign, pulses symmetric, tympanic membranes and canals benign, there is focal tenderness at the frontal sinuses symmetrically, ethmoid and maxillary benign, turbinates appear erythematous and boggy, oropharynx is benign.  Her findings are most consistent with acute frontal sinusitis, we did also swab for COVID and flu, the flu tests were negative. We will contact patient if the COVID results are positive but otherwise will treat with azithromycin course, supportive care, and I have advised her to contact our office if symptoms persist at the end of the antibiotic course to discuss additional evaluation and management options.

## 2020-09-13 LAB — NOVEL CORONAVIRUS, NAA: SARS-CoV-2, NAA: NOT DETECTED

## 2020-09-13 LAB — SARS-COV-2, NAA 2 DAY TAT

## 2020-09-14 ENCOUNTER — Other Ambulatory Visit: Payer: Self-pay | Admitting: Internal Medicine

## 2020-09-14 DIAGNOSIS — I10 Essential (primary) hypertension: Secondary | ICD-10-CM

## 2020-09-14 DIAGNOSIS — F3341 Major depressive disorder, recurrent, in partial remission: Secondary | ICD-10-CM

## 2020-09-14 DIAGNOSIS — E785 Hyperlipidemia, unspecified: Secondary | ICD-10-CM

## 2020-09-19 ENCOUNTER — Telehealth: Payer: Self-pay

## 2020-09-19 ENCOUNTER — Telehealth (INDEPENDENT_AMBULATORY_CARE_PROVIDER_SITE_OTHER): Payer: Medicare Other | Admitting: Internal Medicine

## 2020-09-19 ENCOUNTER — Encounter: Payer: Self-pay | Admitting: Internal Medicine

## 2020-09-19 VITALS — Temp 99.0°F | Ht 63.0 in | Wt 213.0 lb

## 2020-09-19 DIAGNOSIS — J0110 Acute frontal sinusitis, unspecified: Secondary | ICD-10-CM | POA: Diagnosis not present

## 2020-09-19 MED ORDER — AMOXICILLIN-POT CLAVULANATE 875-125 MG PO TABS: 1.0000 | ORAL_TABLET | Freq: Two times a day (BID) | ORAL | 0 refills | Status: AC

## 2020-09-19 NOTE — Telephone Encounter (Signed)
This visit type is being conducted due to national recommendations for restrictions regarding the COVID- 19 Pandemic (e.g. social distancing) in effort to limit this patients exposure and mitigate transmission in our community. This visit type is felt to be most appropriate for this patient at this time.   I connected with the patient today and received telephone consent from the patient and patient understand this consent will be good for 1 year. Patient did not have any questions or concerns during this consent.

## 2020-09-19 NOTE — Telephone Encounter (Signed)
Patient scheduled this morning

## 2020-09-19 NOTE — Progress Notes (Signed)
Date:  09/19/2020   Name:  Kimberly Walls   DOB:  1940-04-12   MRN:  [id number]  This encounter was conducted via video encounter due to the need for social distancing in light of the Covid-19 pandemic.  The patient was correctly identified.  I advised that I am conducting the visit from a secure room in my office at [facility] clinic.  The patient is located at work. The limitations of this form of encounter were discussed with the patient and he/she agreed to proceed.  Some vital signs will be absent.  Chief Complaint: Fever (Chills, cough, headaches, ringing in ears, fatigue. Stayed in bed all day yesterday. Seen Dr. Zigmund Daniel and was tested for Covid. It was NEG. Taking tylenol cold and flu. Nothing taste the same. )  Cough This is a new problem. The cough is Productive of sputum. Associated symptoms include chills, ear pain, a fever (99.0  yesterday), headaches and shortness of breath (minimal with exertion). Pertinent negatives include no chest pain, sore throat or wheezing. Treatments tried: tylenol cold and flu.   Lab Results  Component Value Date   CREATININE 1.01 (H) 07/25/2020   BUN 23 07/25/2020   NA 138 07/25/2020   K 4.1 07/25/2020   CL 96 07/25/2020   CO2 24 07/25/2020   Lab Results  Component Value Date   CHOL 146 07/25/2020   HDL 39 (L) 07/25/2020   LDLCALC 65 07/25/2020   TRIG 265 (H) 07/25/2020   CHOLHDL 3.7 07/25/2020   Lab Results  Component Value Date   TSH 2.040 07/25/2020   Lab Results  Component Value Date   HGBA1C 6.7 (H) 07/25/2020   Lab Results  Component Value Date   WBC 7.3 07/25/2020   HGB 14.8 07/25/2020   HCT 45.1 07/25/2020   MCV 87 07/25/2020   PLT 257 07/25/2020   Lab Results  Component Value Date   ALT 23 07/25/2020   AST 32 07/25/2020   ALKPHOS 106 07/25/2020   BILITOT 0.5 07/25/2020     Review of Systems  Constitutional:  Positive for chills, fatigue and fever (99.0  yesterday).  HENT:  Positive for ear pain and sinus  pressure. Negative for sore throat and trouble swallowing.   Respiratory:  Positive for cough and shortness of breath (minimal with exertion). Negative for wheezing.   Cardiovascular:  Negative for chest pain and palpitations.  Gastrointestinal:  Negative for diarrhea, nausea and vomiting.  Neurological:  Positive for headaches.   Patient Active Problem List   Diagnosis Date Noted   Atypical chest pain 09/12/2020   Acute non-recurrent frontal sinusitis 09/12/2020   BMI 38.0-38.9,adult 06/29/2015   History of pulmonary embolus (PE) 06/29/2015   DM type 2, controlled, with complication ([place]) 123XX123   Arthritis, degenerative 12/05/2014   DDD (degenerative disc disease), lumbar 08/29/2014   Hyperlipidemia associated with type 2 diabetes mellitus ([place]) 08/29/2014   Essential (primary) hypertension 08/29/2014   Acid reflux 08/29/2014   Herpes 08/29/2014   Obstructive sleep apnea of adult 08/29/2014   Depression, major, recurrent, in partial remission ([place]) 08/29/2014   Headache, tension-type 08/29/2014   Dermatophytosis of groin 08/29/2014   History of colon polyps 02/19/2008   Personal history of malignant neoplasm of breast 02/19/1991    Allergies  Allergen Reactions   Prochlorperazine    Pneumococcal Vaccine Rash   Tetanus Toxoids Rash    Past Surgical History:  Procedure Laterality Date   BUNIONECTOMY Bilateral    CARPAL TUNNEL RELEASE  Left    LUMBAR LAMINECTOMY     MASTECTOMY MODIFIED RADICAL Bilateral 1993   PARTIAL HYSTERECTOMY     TOTAL KNEE ARTHROPLASTY Bilateral    TOTAL SHOULDER REPLACEMENT Bilateral     Social History   Tobacco Use   Smoking status: Never   Smokeless tobacco: Never  Vaping Use   Vaping Use: Never used  Substance Use Topics   Alcohol use: Not Currently   Drug use: No     Medication list has been reviewed and updated.  Current Meds  Medication Sig   amLODipine (NORVASC) 5 MG tablet Take 5 mg by mouth daily.   ascorbic acid  (VITAMIN C) 250 MG tablet Take 250 mg by mouth daily.   aspirin 81 MG tablet Take 1 tablet by mouth daily.   atorvastatin (LIPITOR) 10 MG tablet TAKE ONE TABLET BY MOUTH ONCE DAILY   Biotin 2500 MCG CAPS Take 5,000 mcg by mouth daily.   Calcium Carb-Cholecalciferol (CALCIUM + D3) 600-200 MG-UNIT TABS Take 1 tablet by mouth daily.   Cinnamon 500 MG TABS Take 2 tablets by mouth daily.   Coenzyme Q10 (COQ10) 200 MG CAPS Take 1 capsule by mouth daily.   desloratadine (CLARINEX) 5 MG tablet TAKE ONE TABLET BY MOUTH ONCE DAILY   diazepam (VALIUM) 2 MG tablet Take 1 tablet (2 mg total) by mouth every 8 (eight) hours as needed for anxiety.   diclofenac Sodium (VOLTAREN) 1 % GEL Apply topically 4 (four) times daily.   erythromycin ophthalmic ointment Place 1 application into the right eye 3 (three) times daily.   escitalopram (LEXAPRO) 10 MG tablet TAKE ONE TABLET BY MOUTH ONCE DAILY   glucose blood test strip Use to test BS twice a day   hydrochlorothiazide (HYDRODIURIL) 25 MG tablet TAKE ONE TABLET BY MOUTH ONE TIME DAILY   JARDIANCE 10 MG TABS tablet Take 10 mg by mouth daily.   lisinopril (ZESTRIL) 40 MG tablet Take 1 tablet (40 mg total) by mouth daily.   metFORMIN (GLUCOPHAGE-XR) 500 MG 24 hr tablet Take 1 tablet (500 mg total) by mouth 2 (two) times daily.   Multiple Vitamin (MULTIVITAMIN) capsule Take 1 capsule by mouth daily.   nystatin cream (MYCOSTATIN) Apply topically 2 (two) times daily. For rash under breast and abdomen   omeprazole (PRILOSEC) 20 MG capsule TAKE ONE CAPSULE BY MOUTH ONCE DAILY   oxaprozin (DAYPRO) 600 MG tablet TAKE ONE TABLET BY MOUTH TWICE DAILY   zinc gluconate 50 MG tablet Take 50 mg by mouth daily.    PHQ 2/9 Scores 09/12/2020 07/25/2020 07/24/2020 03/02/2020  PHQ - 2 Score 5 2 0 0  PHQ- 9 Score 12 8 - 0    GAD 7 : Generalized Anxiety Score 09/12/2020 07/25/2020 03/02/2020 07/21/2019  Nervous, Anxious, on Edge 0 1 0 0  Control/stop worrying 2 1 0 0  Worry too much -  different things 2 1 0 0  Trouble relaxing 0 1 0 0  Restless 0 0 0 0  Easily annoyed or irritable 0 0 0 0  Afraid - awful might happen 0 0 0 0  Total GAD 7 Score 4 4 0 0  Anxiety Difficulty Somewhat difficult Not difficult at all - Not difficult at all    BP Readings from Last 3 Encounters:  09/12/20 (!) 162/92  07/25/20 (!) 138/92  07/24/20 (!) 160/82    Physical Exam Constitutional:      Appearance: Normal appearance.  Pulmonary:     Effort: Pulmonary effort  is normal.     Comments: No cough or dyspnea noted during the call Neurological:     General: No focal deficit present.     Mental Status: She is alert.  Psychiatric:        Attention and Perception: Attention normal.        Mood and Affect: Mood normal.        Speech: Speech normal.        Cognition and Memory: Cognition normal.    Wt Readings from Last 3 Encounters:  09/19/20 213 lb (96.6 kg)  09/12/20 213 lb (96.6 kg)  07/25/20 210 lb (95.3 kg)    Temp 99 F (37.2 C) (Oral)   Ht '5\' 3"'$  (1.6 m)   Wt 213 lb (96.6 kg)   BMI 37.73 kg/m   Assessment and Plan: 1. Acute non-recurrent frontal sinusitis Recently treated with Zpak now with rebound symptoms mainly related to sinus She has had 2 negative covid tests REcommend rest, fluids, monitor temperature and seek care in person for SOB at rest and/or fever that does not respond to tylenol - amoxicillin-clavulanate (AUGMENTIN) 875-125 MG tablet; Take 1 tablet by mouth 2 (two) times daily for 10 days.  Dispense: 20 tablet; Refill: 0  I spent 8 minutes on this encounter. Partially dictated using Editor, commissioning. Any errors are unintentional.  Halina Maidens, MD [facility]  09/19/2020

## 2020-09-19 NOTE — Progress Notes (Signed)
Date:  09/19/2020   Name:  Kimberly Walls   DOB:  01-Sep-1940   MRN:  [id number]   Chief Complaint: Fever (Chills, cough, headaches, ringing in ears, fatigue. Stayed in bed all day yesterday. Seen Dr. Zigmund Daniel and was tested for Covid. It was NEG. Taking tylenol cold and flu. Nothing taste the same. )  Fever  This is a new problem. The current episode started yesterday. The problem occurs daily. The maximum temperature noted was 99 to 99.9 F. Associated symptoms include congestion, coughing, headaches and muscle aches. She has tried acetaminophen and fluids for the symptoms. The treatment provided mild relief.   Lab Results  Component Value Date   CREATININE 1.01 (H) 07/25/2020   BUN 23 07/25/2020   NA 138 07/25/2020   K 4.1 07/25/2020   CL 96 07/25/2020   CO2 24 07/25/2020   Lab Results  Component Value Date   CHOL 146 07/25/2020   HDL 39 (L) 07/25/2020   LDLCALC 65 07/25/2020   TRIG 265 (H) 07/25/2020   CHOLHDL 3.7 07/25/2020   Lab Results  Component Value Date   TSH 2.040 07/25/2020   Lab Results  Component Value Date   HGBA1C 6.7 (H) 07/25/2020   Lab Results  Component Value Date   WBC 7.3 07/25/2020   HGB 14.8 07/25/2020   HCT 45.1 07/25/2020   MCV 87 07/25/2020   PLT 257 07/25/2020   Lab Results  Component Value Date   ALT 23 07/25/2020   AST 32 07/25/2020   ALKPHOS 106 07/25/2020   BILITOT 0.5 07/25/2020     Review of Systems  Constitutional:  Positive for fever.  HENT:  Positive for congestion.   Respiratory:  Positive for cough.   Neurological:  Positive for headaches.   Patient Active Problem List   Diagnosis Date Noted   Atypical chest pain 09/12/2020   Acute non-recurrent frontal sinusitis 09/12/2020   BMI 38.0-38.9,adult 06/29/2015   History of pulmonary embolus (PE) 06/29/2015   DM type 2, controlled, with complication ([place]) 123XX123   Arthritis, degenerative 12/05/2014   DDD (degenerative disc disease), lumbar 08/29/2014    Hyperlipidemia associated with type 2 diabetes mellitus ([place]) 08/29/2014   Essential (primary) hypertension 08/29/2014   Acid reflux 08/29/2014   Herpes 08/29/2014   Obstructive sleep apnea of adult 08/29/2014   Depression, major, recurrent, in partial remission ([place]) 08/29/2014   Headache, tension-type 08/29/2014   Dermatophytosis of groin 08/29/2014   History of colon polyps 02/19/2008   Personal history of malignant neoplasm of breast 02/19/1991    Allergies  Allergen Reactions   Prochlorperazine    Pneumococcal Vaccine Rash   Tetanus Toxoids Rash    Past Surgical History:  Procedure Laterality Date   BUNIONECTOMY Bilateral    CARPAL TUNNEL RELEASE Left    LUMBAR LAMINECTOMY     MASTECTOMY MODIFIED RADICAL Bilateral 1993   PARTIAL HYSTERECTOMY     TOTAL KNEE ARTHROPLASTY Bilateral    TOTAL SHOULDER REPLACEMENT Bilateral     Social History   Tobacco Use   Smoking status: Never   Smokeless tobacco: Never  Vaping Use   Vaping Use: Never used  Substance Use Topics   Alcohol use: Not Currently   Drug use: No     Medication list has been reviewed and updated.  Current Meds  Medication Sig   amLODipine (NORVASC) 5 MG tablet Take 5 mg by mouth daily.   ascorbic acid (VITAMIN C) 250 MG tablet Take 250 mg by  mouth daily.   aspirin 81 MG tablet Take 1 tablet by mouth daily.   atorvastatin (LIPITOR) 10 MG tablet TAKE ONE TABLET BY MOUTH ONCE DAILY   Biotin 2500 MCG CAPS Take 5,000 mcg by mouth daily.   Calcium Carb-Cholecalciferol (CALCIUM + D3) 600-200 MG-UNIT TABS Take 1 tablet by mouth daily.   Cinnamon 500 MG TABS Take 2 tablets by mouth daily.   Coenzyme Q10 (COQ10) 200 MG CAPS Take 1 capsule by mouth daily.   desloratadine (CLARINEX) 5 MG tablet TAKE ONE TABLET BY MOUTH ONCE DAILY   diazepam (VALIUM) 2 MG tablet Take 1 tablet (2 mg total) by mouth every 8 (eight) hours as needed for anxiety.   diclofenac Sodium (VOLTAREN) 1 % GEL Apply topically 4 (four) times  daily.   erythromycin ophthalmic ointment Place 1 application into the right eye 3 (three) times daily.   escitalopram (LEXAPRO) 10 MG tablet TAKE ONE TABLET BY MOUTH ONCE DAILY   glucose blood test strip Use to test BS twice a day   hydrochlorothiazide (HYDRODIURIL) 25 MG tablet TAKE ONE TABLET BY MOUTH ONE TIME DAILY   JARDIANCE 10 MG TABS tablet Take 10 mg by mouth daily.   lisinopril (ZESTRIL) 40 MG tablet Take 1 tablet (40 mg total) by mouth daily.   metFORMIN (GLUCOPHAGE-XR) 500 MG 24 hr tablet Take 1 tablet (500 mg total) by mouth 2 (two) times daily.   Multiple Vitamin (MULTIVITAMIN) capsule Take 1 capsule by mouth daily.   nystatin cream (MYCOSTATIN) Apply topically 2 (two) times daily. For rash under breast and abdomen   omeprazole (PRILOSEC) 20 MG capsule TAKE ONE CAPSULE BY MOUTH ONCE DAILY   oxaprozin (DAYPRO) 600 MG tablet TAKE ONE TABLET BY MOUTH TWICE DAILY   zinc gluconate 50 MG tablet Take 50 mg by mouth daily.    PHQ 2/9 Scores 09/12/2020 07/25/2020 07/24/2020 03/02/2020  PHQ - 2 Score 5 2 0 0  PHQ- 9 Score 12 8 - 0    GAD 7 : Generalized Anxiety Score 09/12/2020 07/25/2020 03/02/2020 07/21/2019  Nervous, Anxious, on Edge 0 1 0 0  Control/stop worrying 2 1 0 0  Worry too much - different things 2 1 0 0  Trouble relaxing 0 1 0 0  Restless 0 0 0 0  Easily annoyed or irritable 0 0 0 0  Afraid - awful might happen 0 0 0 0  Total GAD 7 Score 4 4 0 0  Anxiety Difficulty Somewhat difficult Not difficult at all - Not difficult at all    BP Readings from Last 3 Encounters:  09/12/20 (!) 162/92  07/25/20 (!) 138/92  07/24/20 (!) 160/82    Physical Exam  Wt Readings from Last 3 Encounters:  09/19/20 213 lb (96.6 kg)  09/12/20 213 lb (96.6 kg)  07/25/20 210 lb (95.3 kg)    Temp 99 F (37.2 C) (Oral)   Ht '5\' 3"'$  (1.6 m)   Wt 213 lb (96.6 kg)   BMI 37.73 kg/m   Assessment and Plan:

## 2020-09-25 ENCOUNTER — Encounter: Payer: Self-pay | Admitting: Internal Medicine

## 2020-11-06 DIAGNOSIS — I350 Nonrheumatic aortic (valve) stenosis: Secondary | ICD-10-CM | POA: Insufficient documentation

## 2020-11-23 ENCOUNTER — Other Ambulatory Visit: Payer: Self-pay | Admitting: Internal Medicine

## 2020-11-23 DIAGNOSIS — I10 Essential (primary) hypertension: Secondary | ICD-10-CM

## 2020-11-24 ENCOUNTER — Ambulatory Visit: Payer: Medicare Other | Admitting: Internal Medicine

## 2020-11-24 NOTE — Telephone Encounter (Signed)
Requested medications are due for refill today NO  Requested medications are on the active medication list Dose inconsistent with current med list  Last refill 05/12/20  Last visit 6/7  Future visit scheduled 12/01/20  Notes to clinic Dose inconsistent with current med list

## 2020-12-01 ENCOUNTER — Ambulatory Visit (INDEPENDENT_AMBULATORY_CARE_PROVIDER_SITE_OTHER): Payer: Medicare Other | Admitting: Internal Medicine

## 2020-12-01 ENCOUNTER — Other Ambulatory Visit: Payer: Self-pay

## 2020-12-01 ENCOUNTER — Encounter: Payer: Self-pay | Admitting: Internal Medicine

## 2020-12-01 VITALS — BP 144/80 | HR 80 | Ht 63.0 in | Wt 211.0 lb

## 2020-12-01 DIAGNOSIS — I10 Essential (primary) hypertension: Secondary | ICD-10-CM | POA: Diagnosis not present

## 2020-12-01 DIAGNOSIS — I350 Nonrheumatic aortic (valve) stenosis: Secondary | ICD-10-CM

## 2020-12-01 DIAGNOSIS — E785 Hyperlipidemia, unspecified: Secondary | ICD-10-CM

## 2020-12-01 DIAGNOSIS — F3341 Major depressive disorder, recurrent, in partial remission: Secondary | ICD-10-CM | POA: Diagnosis not present

## 2020-12-01 DIAGNOSIS — Z23 Encounter for immunization: Secondary | ICD-10-CM | POA: Diagnosis not present

## 2020-12-01 DIAGNOSIS — E118 Type 2 diabetes mellitus with unspecified complications: Secondary | ICD-10-CM

## 2020-12-01 DIAGNOSIS — E1169 Type 2 diabetes mellitus with other specified complication: Secondary | ICD-10-CM | POA: Diagnosis not present

## 2020-12-01 LAB — POCT GLYCOSYLATED HEMOGLOBIN (HGB A1C): Hemoglobin A1C: 6.4 % — AB (ref 4.0–5.6)

## 2020-12-01 MED ORDER — AMLODIPINE BESYLATE 5 MG PO TABS: 5.0000 mg | ORAL_TABLET | Freq: Every day | ORAL | 0 refills | Status: DC

## 2020-12-01 MED ORDER — AMLODIPINE BESYLATE 5 MG PO TABS: 2.5000 mg | ORAL_TABLET | Freq: Every day | ORAL | 0 refills | Status: DC

## 2020-12-01 NOTE — Progress Notes (Signed)
Date:  12/01/2020   Name:  Kimberly Walls   DOB:  December 30, 1940   MRN:  [id number]   Chief Complaint: Hypertension and Diabetes  Hypertension This is a chronic problem. The problem is controlled. Pertinent negatives include no chest pain, headaches, palpitations or shortness of breath. Past treatments include calcium channel blockers, diuretics and ACE inhibitors.  Diabetes She presents for her follow-up diabetic visit. She has type 2 diabetes mellitus. Pertinent negatives for hypoglycemia include no headaches or tremors. Pertinent negatives for diabetes include no chest pain, no fatigue, no polydipsia and no polyuria. Current diabetic treatment includes oral agent (dual therapy) (metformin and jardiance). She is compliant with treatment all of the time. Her weight is stable. Her overall blood glucose range is 110-130 mg/dl.  Depression        This is a chronic problem.The problem is unchanged.  Associated symptoms include no fatigue, no appetite change and no headaches.  Past treatments include SSRIs - Selective serotonin reuptake inhibitors. Hyperlipidemia This is a chronic problem. The problem is controlled (LDL at goal < 70). Pertinent negatives include no chest pain or shortness of breath. Current antihyperlipidemic treatment includes statins.   Lab Results  Component Value Date   CREATININE 1.01 (H) 07/25/2020   BUN 23 07/25/2020   NA 138 07/25/2020   K 4.1 07/25/2020   CL 96 07/25/2020   CO2 24 07/25/2020   Lab Results  Component Value Date   CHOL 146 07/25/2020   HDL 39 (L) 07/25/2020   LDLCALC 65 07/25/2020   TRIG 265 (H) 07/25/2020   CHOLHDL 3.7 07/25/2020   Lab Results  Component Value Date   TSH 2.040 07/25/2020   Lab Results  Component Value Date   HGBA1C 6.4 (A) 12/01/2020   Lab Results  Component Value Date   WBC 7.3 07/25/2020   HGB 14.8 07/25/2020   HCT 45.1 07/25/2020   MCV 87 07/25/2020   PLT 257 07/25/2020   Lab Results  Component Value Date   ALT  23 07/25/2020   AST 32 07/25/2020   ALKPHOS 106 07/25/2020   BILITOT 0.5 07/25/2020     Review of Systems  Constitutional:  Negative for appetite change, fatigue, fever and unexpected weight change.  HENT:  Negative for tinnitus and trouble swallowing.   Eyes:  Negative for visual disturbance.  Respiratory:  Negative for cough, chest tightness and shortness of breath.   Cardiovascular:  Negative for chest pain, palpitations and leg swelling.  Gastrointestinal:  Negative for abdominal pain.  Endocrine: Negative for polydipsia and polyuria.  Genitourinary:  Negative for dysuria and hematuria.  Musculoskeletal:  Positive for back pain (right SI region). Negative for arthralgias.  Neurological:  Negative for tremors, numbness and headaches.  Psychiatric/Behavioral:  Positive for depression. Negative for dysphoric mood.    Patient Active Problem List   Diagnosis Date Noted   Mild aortic stenosis 11/06/2020   Atypical chest pain 09/12/2020   BMI 38.0-38.9,adult 06/29/2015   History of pulmonary embolus (PE) 06/29/2015   DM type 2, controlled, with complication ([place]) 54/65/0354   Arthritis, degenerative 12/05/2014   DDD (degenerative disc disease), lumbar 08/29/2014   Hyperlipidemia associated with type 2 diabetes mellitus ([place]) 08/29/2014   Essential (primary) hypertension 08/29/2014   Acid reflux 08/29/2014   Herpes 08/29/2014   Obstructive sleep apnea of adult 08/29/2014   Depression, major, recurrent, in partial remission ([place]) 08/29/2014   Headache, tension-type 08/29/2014   Dermatophytosis of groin 08/29/2014   History of colon  polyps 02/19/2008   Personal history of malignant neoplasm of breast 02/19/1991    Allergies  Allergen Reactions   Prochlorperazine    Pneumococcal Vaccine Rash   Tetanus Toxoids Rash    Past Surgical History:  Procedure Laterality Date   BUNIONECTOMY Bilateral    CARPAL TUNNEL RELEASE Left    LUMBAR LAMINECTOMY     MASTECTOMY MODIFIED  RADICAL Bilateral 1993   PARTIAL HYSTERECTOMY     TOTAL KNEE ARTHROPLASTY Bilateral    TOTAL SHOULDER REPLACEMENT Bilateral     Social History   Tobacco Use   Smoking status: Never   Smokeless tobacco: Never  Vaping Use   Vaping Use: Never used  Substance Use Topics   Alcohol use: Not Currently   Drug use: No     Medication list has been reviewed and updated.  Current Meds  Medication Sig   amLODipine (NORVASC) 2.5 MG tablet TAKE ONE TABLET BY MOUTH ONCE DAILY   ascorbic acid (VITAMIN C) 250 MG tablet Take 250 mg by mouth daily.   aspirin 81 MG tablet Take 1 tablet by mouth daily.   atorvastatin (LIPITOR) 10 MG tablet TAKE ONE TABLET BY MOUTH ONCE DAILY   Biotin 2500 MCG CAPS Take 5,000 mcg by mouth daily.   Calcium Carb-Cholecalciferol (CALCIUM + D3) 600-200 MG-UNIT TABS Take 1 tablet by mouth daily.   Cinnamon 500 MG TABS Take 2 tablets by mouth daily.   Coenzyme Q10 (COQ10) 200 MG CAPS Take 1 capsule by mouth daily.   desloratadine (CLARINEX) 5 MG tablet TAKE ONE TABLET BY MOUTH ONCE DAILY   diazepam (VALIUM) 2 MG tablet Take 1 tablet (2 mg total) by mouth every 8 (eight) hours as needed for anxiety.   diclofenac Sodium (VOLTAREN) 1 % GEL Apply topically 4 (four) times daily.   erythromycin ophthalmic ointment Place 1 application into the right eye 3 (three) times daily.   escitalopram (LEXAPRO) 10 MG tablet TAKE ONE TABLET BY MOUTH ONCE DAILY   glucose blood test strip Use to test BS twice a day   hydrochlorothiazide (HYDRODIURIL) 25 MG tablet TAKE ONE TABLET BY MOUTH ONE TIME DAILY   JARDIANCE 10 MG TABS tablet Take 10 mg by mouth daily.   lisinopril (ZESTRIL) 40 MG tablet Take 1 tablet (40 mg total) by mouth daily.   metFORMIN (GLUCOPHAGE-XR) 500 MG 24 hr tablet Take 1 tablet (500 mg total) by mouth 2 (two) times daily.   Multiple Vitamin (MULTIVITAMIN) capsule Take 1 capsule by mouth daily.   nystatin cream (MYCOSTATIN) Apply topically 2 (two) times daily. For rash  under breast and abdomen   omeprazole (PRILOSEC) 20 MG capsule TAKE ONE CAPSULE BY MOUTH ONCE DAILY   oxaprozin (DAYPRO) 600 MG tablet TAKE ONE TABLET BY MOUTH TWICE DAILY   zinc gluconate 50 MG tablet Take 50 mg by mouth daily.    PHQ 2/9 Scores 12/01/2020 09/12/2020 07/25/2020 07/24/2020  PHQ - 2 Score 1 5 2  0  PHQ- 9 Score 3 12 8  -    GAD 7 : Generalized Anxiety Score 12/01/2020 09/12/2020 07/25/2020 03/02/2020  Nervous, Anxious, on Edge 0 0 1 0  Control/stop worrying 1 2 1  0  Worry too much - different things 1 2 1  0  Trouble relaxing 0 0 1 0  Restless 0 0 0 0  Easily annoyed or irritable 0 0 0 0  Afraid - awful might happen 0 0 0 0  Total GAD 7 Score 2 4 4  0  Anxiety Difficulty Not  difficult at all Somewhat difficult Not difficult at all -    BP Readings from Last 3 Encounters:  12/01/20 (!) 144/80  09/12/20 (!) 162/92  07/25/20 (!) 138/92    Physical Exam Vitals and nursing note reviewed.  Constitutional:      General: She is not in acute distress.    Appearance: She is well-developed.  HENT:     Head: Normocephalic and atraumatic.  Cardiovascular:     Rate and Rhythm: Normal rate and regular rhythm.     Pulses: Normal pulses.     Heart sounds: No murmur heard. Pulmonary:     Effort: Pulmonary effort is normal. No respiratory distress.     Breath sounds: No wheezing or rhonchi.  Musculoskeletal:     Cervical back: Normal range of motion.     Lumbar back: Tenderness (left SI region) present. No spasms. Negative right straight leg raise test and negative left straight leg raise test.     Right lower leg: No edema.     Left lower leg: No edema.  Lymphadenopathy:     Cervical: No cervical adenopathy.  Skin:    General: Skin is warm and dry.     Capillary Refill: Capillary refill takes less than 2 seconds.     Findings: No rash.  Neurological:     General: No focal deficit present.     Mental Status: She is alert and oriented to person, place, and time.  Psychiatric:         Mood and Affect: Mood normal.        Behavior: Behavior normal.    Wt Readings from Last 3 Encounters:  12/01/20 211 lb (95.7 kg)  09/19/20 213 lb (96.6 kg)  09/12/20 213 lb (96.6 kg)    BP (!) 144/80   Pulse 80   Ht 5\' 3"  (1.6 m)   Wt 211 lb (95.7 kg)   SpO2 97%   BMI 37.38 kg/m   Assessment and Plan: 1. DM type 2, controlled, with complication ([place]) Clinically stable by exam and report without s/s of hypoglycemia. DM complicated by hypertension and dyslipidemia. Tolerating medications well without side effects or other concerns. - POCT glycosylated hemoglobin (Hb A1C) = 6.4 down from 6.9  2. Hyperlipidemia associated with type 2 diabetes mellitus ([place]) Tolerating statin medication without side effects at this time LDL is at goal of < 70 on current dose Continue same therapy without change at this time.  3. Essential (primary) hypertension BP slightly elevated today - continue current regimen and monitoring at home. Will adjust meds next visit if needed - amLODipine (NORVASC) 5 MG tablet; Take 1 tablet (5 mg total) by mouth daily.  Dispense: 30 tablet; Refill: 0  4. Depression, major, recurrent, in partial remission ([place]) Clinically stable on current regimen with good control of symptoms, No SI or HI. Will continue current therapy.  5. Mild aortic stenosis Seen on recent ECHO Currently without activity limiting symptoms   Partially dictated using Editor, commissioning. Any errors are unintentional.  Halina Maidens, MD [facility]  12/01/2020

## 2020-12-04 ENCOUNTER — Encounter: Payer: Self-pay | Admitting: Internal Medicine

## 2020-12-11 ENCOUNTER — Encounter: Payer: Self-pay | Admitting: Internal Medicine

## 2020-12-13 ENCOUNTER — Other Ambulatory Visit: Payer: Self-pay | Admitting: Internal Medicine

## 2020-12-13 DIAGNOSIS — E785 Hyperlipidemia, unspecified: Secondary | ICD-10-CM

## 2020-12-13 DIAGNOSIS — F3341 Major depressive disorder, recurrent, in partial remission: Secondary | ICD-10-CM

## 2020-12-13 DIAGNOSIS — I10 Essential (primary) hypertension: Secondary | ICD-10-CM

## 2020-12-13 NOTE — Telephone Encounter (Signed)
Requested Prescriptions  Pending Prescriptions Disp Refills  . omeprazole (PRILOSEC) 20 MG capsule [Pharmacy Med Name: Omeprazole Oral Capsule Delayed Release 20 MG] 90 capsule 1    Sig: TAKE ONE CAPSULE BY MOUTH ONCE DAILY     Gastroenterology: Proton Pump Inhibitors Passed - 12/13/2020  9:27 AM      Passed - Valid encounter within last 12 months    Recent Outpatient Visits          1 week ago DM type 2, controlled, with complication [facility])   [facility] Glean [facility], [facility]   2 months ago Acute non-recurrent frontal sinusitis   [facility] Glean Hess, [facility]   3 months ago Acute non-recurrent frontal sinusitis   [facility] Montel Culver, MD   4 months ago Essential (primary) hypertension   [facility] Glean Hess, MD   9 months ago DM type 2, controlled, with complication [facility])   [facility] Glean Hess, MD      Future Appointments            In 3 months Glean Hess, MD [facility], [place]   In 7 months Army Melia Jesse Sans, MD [facility]           . hydrochlorothiazide (HYDRODIURIL) 25 MG tablet [Pharmacy Med Name: hydroCHLOROthiazide Oral Tablet 25 MG] 90 tablet 1    Sig: TAKE ONE TABLET BY MOUTH ONCE DAILY     Cardiovascular: Diuretics - Thiazide Failed - 12/13/2020  9:27 AM      Failed - Cr in normal range and within 360 days    Creatinine, Ser  Date Value Ref Range Status  07/25/2020 1.01 (H) 0.57 - 1.00 mg/dL Final         Failed - Last BP in normal range    BP Readings from Last 1 Encounters:  12/01/20 (!) 144/80         Passed - Ca in normal range and within 360 days    Calcium  Date Value Ref Range Status  07/25/2020 9.8 8.7 - 10.3 mg/dL Final         Passed - K in normal range and within 360 days    Potassium  Date Value Ref Range Status  07/25/2020 4.1 3.5 - 5.2 mmol/L Final         Passed - Na in normal range and within 360 days    Sodium  Date  Value Ref Range Status  07/25/2020 138 134 - 144 mmol/L Final         Passed - Valid encounter within last 6 months    Recent Outpatient Visits          1 week ago DM type 2, controlled, with complication [facility])   [facility] Glean [facility], MD   2 months ago Acute non-recurrent frontal sinusitis   [facility] Glean Hess, MD   3 months ago Acute non-recurrent frontal sinusitis   [facility] Mont[facility] Culver, MD   4 months ago Essential (primary) hypertension   [facility] Glean Hess, MD   9 months ago DM type 2, controlled, with complication [facility])   [facility] Glean Hess, MD      Future Appointments            In 3 months Army Melia Jesse Sans, MD [facility] [place], [place]   In 7 months Glean Hess,  MD [facility], [facility]           . escitalopram (LEXAPRO) 10 MG tablet [Pharmacy Med Name: Escitalopram Oxalate Oral Tablet 10 MG] 90 tablet 1    Sig: TAKE ONE TABLET BY MOUTH ONCE DAILY     Psychiatry:  Antidepressants - SSRI Passed - 12/13/2020  9:27 AM      Passed - Completed PHQ-2 or PHQ-9 in the last 360 days      Passed - Valid encounter within last 6 months    Recent Outpatient Visits          1 week ago DM type 2, controlled, with complication [facility])   [facility] Glean [facility], MD   2 months ago Acute non-recurrent frontal sinusitis   [facility] Glean Hess, MD   3 months ago Acute non-recurrent frontal sinusitis   [facility] Montel Culver, MD   4 months ago Essential (primary) hypertension   [facility] Glean Hess, MD   9 months ago DM type 2, controlled, with complication [facility])   [facility], [facility]      Future Appointments            In 3 months Army Melia Jesse Sans, MD [facility], [place]   In 7 months Army Melia Jesse Sans, MD [facility], [place]           . atorvastatin  (LIPITOR) 10 MG tablet [Pharmacy Med Name: Atorvastatin Calcium Oral Tablet 10 MG] 90 tablet 1    Sig: TAKE ONE TABLET BY MOUTH ONCE DAILY     Cardiovascular:  Antilipid - Statins Failed - 12/13/2020  9:27 AM      Failed - HDL in normal range and within 360 days    HDL  Date Value Ref Range Status  07/25/2020 39 (L) >39 mg/dL Final         Failed - Triglycerides in normal range and within 360 days    Triglycerides  Date Value Ref Range Status  07/25/2020 265 (H) 0 - 149 mg/dL Final         Passed - Total Cholesterol in normal range and within 360 days    Cholesterol, Total  Date Value Ref Range Status  07/25/2020 146 100 - 199 mg/dL Final         Passed - LDL in normal range and within 360 days    LDL Chol Calc (NIH)  Date Value Ref Range Status  07/25/2020 65 0 - 99 mg/dL Final         Passed - Patient is not pregnant      Passed - Valid encounter within last 12 months    Recent Outpatient Visits          1 week ago DM type 2, controlled, with complication [facility])   [facility] Glean Hess, MD   2 months ago Acute non-recurrent frontal sinusitis   [facility] Glean Hess, MD   3 months ago Acute non-recurrent frontal sinusitis   [facility] Montel Culver, MD   4 months ago Essential (primary) hypertension   [facility] Glean Hess, MD   9 months ago DM type 2, controlled, with complication [facility])   [facility] Glean Hess, MD      Future Appointments            In 3 months Army Melia Jesse Sans, MD [facility], [place]   In 7 months Army Melia, Jesse Sans, MD [place]

## 2020-12-29 ENCOUNTER — Other Ambulatory Visit: Payer: Self-pay

## 2020-12-29 ENCOUNTER — Telehealth: Payer: Self-pay | Admitting: Internal Medicine

## 2020-12-29 ENCOUNTER — Encounter: Payer: Self-pay | Admitting: Internal Medicine

## 2020-12-29 ENCOUNTER — Ambulatory Visit (INDEPENDENT_AMBULATORY_CARE_PROVIDER_SITE_OTHER): Payer: Medicare Other | Admitting: Internal Medicine

## 2020-12-29 VITALS — BP 120/70 | HR 104 | Temp 97.9°F | Ht 63.0 in | Wt 208.2 lb

## 2020-12-29 DIAGNOSIS — R051 Acute cough: Secondary | ICD-10-CM | POA: Diagnosis not present

## 2020-12-29 DIAGNOSIS — J069 Acute upper respiratory infection, unspecified: Secondary | ICD-10-CM

## 2020-12-29 DIAGNOSIS — H1031 Unspecified acute conjunctivitis, right eye: Secondary | ICD-10-CM | POA: Diagnosis not present

## 2020-12-29 LAB — POCT INFLUENZA A/B
Influenza A, POC: NEGATIVE
Influenza B, POC: NEGATIVE

## 2020-12-29 LAB — POC COVID19 BINAXNOW: SARS Coronavirus 2 Ag: NEGATIVE

## 2020-12-29 MED ORDER — AZITHROMYCIN 250 MG PO TABS: ORAL_TABLET | ORAL | 0 refills | Status: AC

## 2020-12-29 MED ORDER — NEOMYCIN-POLYMYXIN-DEXAMETH 3.5-10000-0.1 OP SUSP: 2.0000 [drp] | Freq: Four times a day (QID) | OPHTHALMIC | 0 refills | Status: AC

## 2020-12-29 NOTE — Telephone Encounter (Signed)
Patient called in asking for medicines, azithromycin (ZITHROMAX Z-PAK) 250 MG tablet [[id number]] and neomycin-polymyxin b-dexamethasone (MAXITROL) 3.5-10000-0.1  to be sent to [facility] in [place], [address] [place], [place] [address], [phone]

## 2020-12-29 NOTE — Telephone Encounter (Signed)
On call provider Dr. Ronnald Ramp called to advise medication request for a different pharmacy, she says she will phone it in to [facility]

## 2020-12-29 NOTE — Progress Notes (Signed)
Date:  12/29/2020   Name:  Kimberly Walls   DOB:  30-Apr-1940   MRN:  [id number]   Chief Complaint: Cough  Cough This is a new problem. The cough is Productive of sputum. Associated symptoms include chills, eye redness, nasal congestion, rhinorrhea (clear in color), a sore throat and sweats. Pertinent negatives include no chest pain, fever, headaches, shortness of breath or wheezing. Associated symptoms comments: tinnitis.  Conjunctivitis  The current episode started 5 to 7 days ago. The onset was sudden. The problem occurs continuously. The problem has been gradually improving. The problem is moderate. Associated symptoms include congestion, rhinorrhea (clear in color), sore throat, cough, eye discharge and eye redness. Pertinent negatives include no fever, no headaches and no wheezing.   Lab Results  Component Value Date   CREATININE 1.01 (H) 07/25/2020   BUN 23 07/25/2020   NA 138 07/25/2020   K 4.1 07/25/2020   CL 96 07/25/2020   CO2 24 07/25/2020   Lab Results  Component Value Date   CHOL 146 07/25/2020   HDL 39 (L) 07/25/2020   LDLCALC 65 07/25/2020   TRIG 265 (H) 07/25/2020   CHOLHDL 3.7 07/25/2020   Lab Results  Component Value Date   TSH 2.040 07/25/2020   Lab Results  Component Value Date   HGBA1C 6.4 (A) 12/01/2020   Lab Results  Component Value Date   WBC 7.3 07/25/2020   HGB 14.8 07/25/2020   HCT 45.1 07/25/2020   MCV 87 07/25/2020   PLT 257 07/25/2020   Lab Results  Component Value Date   ALT 23 07/25/2020   AST 32 07/25/2020   ALKPHOS 106 07/25/2020   BILITOT 0.5 07/25/2020     Review of Systems  Constitutional:  Positive for chills. Negative for fever.  HENT:  Positive for congestion, rhinorrhea (clear in color), sore throat and tinnitus.   Eyes:  Positive for discharge and redness.  Respiratory:  Positive for cough. Negative for chest tightness, shortness of breath and wheezing.   Cardiovascular:  Negative for chest pain.  Neurological:   Positive for dizziness. Negative for light-headedness and headaches.   Patient Active Problem List   Diagnosis Date Noted   Mild aortic stenosis 11/06/2020   Atypical chest pain 09/12/2020   BMI 38.0-38.9,adult 06/29/2015   History of pulmonary embolus (PE) 06/29/2015   DM type 2, controlled, with complication ([place]) 70/26/3785   Arthritis, degenerative 12/05/2014   DDD (degenerative disc disease), lumbar 08/29/2014   Hyperlipidemia associated with type 2 diabetes mellitus ([place]) 08/29/2014   Essential (primary) hypertension 08/29/2014   Acid reflux 08/29/2014   Herpes 08/29/2014   Obstructive sleep apnea of adult 08/29/2014   Depression, major, recurrent, in partial remission ([place]) 08/29/2014   Headache, tension-type 08/29/2014   Dermatophytosis of groin 08/29/2014   History of colon polyps 02/19/2008   Personal history of malignant neoplasm of breast 02/19/1991    Allergies  Allergen Reactions   Prochlorperazine    Pneumococcal Vaccine Rash   Tetanus Toxoids Rash    Past Surgical History:  Procedure Laterality Date   BUNIONECTOMY Bilateral    CARPAL TUNNEL RELEASE Left    LUMBAR LAMINECTOMY     MASTECTOMY MODIFIED RADICAL Bilateral 1993   PARTIAL HYSTERECTOMY     TOTAL KNEE ARTHROPLASTY Bilateral    TOTAL SHOULDER REPLACEMENT Bilateral     Social History   Tobacco Use   Smoking status: Never   Smokeless tobacco: Never  Vaping Use   Vaping Use: Never  used  Substance Use Topics   Alcohol use: Not Currently   Drug use: No     Medication list has been reviewed and updated.  Current Meds  Medication Sig   amLODipine (NORVASC) 5 MG tablet Take 1 tablet (5 mg total) by mouth daily.   amoxicillin (AMOXIL) 500 MG capsule Take 1,000 mg by mouth 2 (two) times daily. PRN for Dental Procedures   ascorbic acid (VITAMIN C) 250 MG tablet Take 250 mg by mouth daily.   aspirin 81 MG tablet Take 1 tablet by mouth daily.   atorvastatin (LIPITOR) 10 MG tablet TAKE ONE TABLET  BY MOUTH ONCE DAILY   azithromycin (ZITHROMAX Z-PAK) 250 MG tablet UAD   Biotin 2500 MCG CAPS Take 5,000 mcg by mouth daily.   Calcium Carb-Cholecalciferol (CALCIUM + D3) 600-200 MG-UNIT TABS Take 1 tablet by mouth daily.   Cinnamon 500 MG TABS Take 2 tablets by mouth daily.   Coenzyme Q10 (COQ10) 200 MG CAPS Take 1 capsule by mouth daily.   desloratadine (CLARINEX) 5 MG tablet TAKE ONE TABLET BY MOUTH ONCE DAILY   diazepam (VALIUM) 2 MG tablet Take 1 tablet (2 mg total) by mouth every 8 (eight) hours as needed for anxiety.   diclofenac Sodium (VOLTAREN) 1 % GEL Apply topically 4 (four) times daily.   erythromycin ophthalmic ointment Place 1 application into the right eye 3 (three) times daily.   escitalopram (LEXAPRO) 10 MG tablet TAKE ONE TABLET BY MOUTH ONCE DAILY   glucose blood test strip Use to test BS twice a day   hydrochlorothiazide (HYDRODIURIL) 25 MG tablet TAKE ONE TABLET BY MOUTH ONCE DAILY   JARDIANCE 10 MG TABS tablet Take 10 mg by mouth daily.   lisinopril (ZESTRIL) 40 MG tablet Take 1 tablet (40 mg total) by mouth daily.   metFORMIN (GLUCOPHAGE-XR) 500 MG 24 hr tablet Take 1 tablet (500 mg total) by mouth 2 (two) times daily.   Multiple Vitamin (MULTIVITAMIN) capsule Take 1 capsule by mouth daily.   neomycin-polymyxin b-dexamethasone (MAXITROL) 3.5-10000-0.1 SUSP Place 2 drops into both eyes every 6 (six) hours for 10 days.   nystatin cream (MYCOSTATIN) Apply topically 2 (two) times daily. For rash under breast and abdomen   omeprazole (PRILOSEC) 20 MG capsule TAKE ONE CAPSULE BY MOUTH ONCE DAILY   oxaprozin (DAYPRO) 600 MG tablet TAKE ONE TABLET BY MOUTH TWICE DAILY   zinc gluconate 50 MG tablet Take 50 mg by mouth daily.    PHQ 2/9 Scores 12/01/2020 09/12/2020 07/25/2020 07/24/2020  PHQ - 2 Score 1 5 2  0  PHQ- 9 Score 3 12 8  -    GAD 7 : Generalized Anxiety Score 12/01/2020 09/12/2020 07/25/2020 03/02/2020  Nervous, Anxious, on Edge 0 0 1 0  Control/stop worrying 1 2 1  0   Worry too much - different things 1 2 1  0  Trouble relaxing 0 0 1 0  Restless 0 0 0 0  Easily annoyed or irritable 0 0 0 0  Afraid - awful might happen 0 0 0 0  Total GAD 7 Score 2 4 4  0  Anxiety Difficulty Not difficult at all Somewhat difficult Not difficult at all -    BP Readings from Last 3 Encounters:  12/29/20 120/70  12/01/20 (!) 144/80  09/12/20 (!) 162/92    Physical Exam Vitals and nursing note reviewed.  Constitutional:      General: She is not in acute distress.    Appearance: She is well-developed. She is ill-appearing.  HENT:     Head: Normocephalic and atraumatic.     Right Ear: Tympanic membrane normal.     Left Ear: A middle ear effusion is present.     Nose:     Right Sinus: No maxillary sinus tenderness or frontal sinus tenderness.     Left Sinus: No maxillary sinus tenderness or frontal sinus tenderness.  Eyes:     Conjunctiva/sclera:     Right eye: Right conjunctiva is injected. Chemosis present.  Cardiovascular:     Rate and Rhythm: Normal rate and regular rhythm.     Pulses: Normal pulses.  Pulmonary:     Effort: Pulmonary effort is normal. No respiratory distress.     Breath sounds: No wheezing or rhonchi.  Musculoskeletal:     Cervical back: Normal range of motion.     Right lower leg: No edema.     Left lower leg: No edema.  Lymphadenopathy:     Cervical: No cervical adenopathy.  Skin:    General: Skin is warm and dry.     Findings: No rash.  Neurological:     Mental Status: She is alert and oriented to person, place, and time.  Psychiatric:        Mood and Affect: Mood normal.        Behavior: Behavior normal.    Wt Readings from Last 3 Encounters:  12/29/20 208 lb 3.2 oz (94.4 kg)  12/01/20 211 lb (95.7 kg)  09/19/20 213 lb (96.6 kg)    BP 120/70   Pulse (!) 104   Temp 97.9 F (36.6 C) (Oral)   Ht 5\' 3"  (1.6 m)   Wt 208 lb 3.2 oz (94.4 kg)   SpO2 96%   BMI 36.88 kg/m   Assessment and Plan: 1. Acute cough Push fluids,  use Delsym - POCT Influenza A/B - neg for A and B - POC COVID-19 BinaxNow - negative  2. Upper respiratory tract infection, unspecified type - azithromycin (ZITHROMAX Z-PAK) 250 MG tablet; UAD  Dispense: 6 each; Refill: 0  3. Acute bacterial conjunctivitis of right eye - neomycin-polymyxin b-dexamethasone (MAXITROL) 3.5-10000-0.1 SUSP; Place 2 drops into both eyes every 6 (six) hours for 10 days.  Dispense: 5 mL; Refill: 0   Partially dictated using Editor, commissioning. Any errors are unintentional.  Halina Maidens, MD [facility]  12/29/2020

## 2021-01-05 ENCOUNTER — Other Ambulatory Visit: Payer: Self-pay | Admitting: Internal Medicine

## 2021-01-05 NOTE — Telephone Encounter (Signed)
Requested medication (s) are due for refill today: no  Requested medication (s) are on the active medication list: yes  Last refill:  08/22/20 #60/4RF  Future visit scheduled: Yes  Notes to clinic:  Unable to refill per protocol due to failed labs, no updated results.      Requested Prescriptions  Pending Prescriptions Disp Refills   metFORMIN (GLUCOPHAGE-XR) 500 MG 24 hr tablet [Pharmacy Med Name: metFORMIN HCl ER Oral Tablet Extended Release 24 Hour 500 MG] 60 tablet 0    Sig: TAKE ONE TABLET BY MOUTH TWICE DAILY     Endocrinology:  Diabetes - Biguanides Failed - 01/05/2021 10:59 AM      Failed - Cr in normal range and within 360 days    Creatinine, Ser  Date Value Ref Range Status  07/25/2020 1.01 (H) 0.57 - 1.00 mg/dL Final          Failed - eGFR in normal range and within 360 days    GFR calc Af Amer  Date Value Ref Range Status  11/16/2019 72 >59 mL/min/1.73 Final    Comment:    **[facility] currently reports eGFR in compliance with the current**   recommendations of the [employer]. [facility] will   update reporting as new guidelines are published from the NKF-ASN   Task force.    GFR calc non Af Amer  Date Value Ref Range Status  11/16/2019 62 >59 mL/min/1.73 Final   eGFR  Date Value Ref Range Status  07/25/2020 57 (L) >59 mL/min/1.73 Final          Passed - HBA1C is between 0 and 7.9 and within 180 days    Hemoglobin A1C  Date Value Ref Range Status  12/01/2020 6.4 (A) 4.0 - 5.6 % Final   Hgb A1c MFr Bld  Date Value Ref Range Status  07/25/2020 6.7 (H) 4.8 - 5.6 % Final    Comment:             Prediabetes: 5.7 - 6.4          Diabetes: >6.4          Glycemic control for adults with diabetes: <7.0           Passed - Valid encounter within last 6 months    Recent Outpatient Visits           1 week ago Acute cough   [facility] Glean Hess, MD   1 month ago DM type 2, controlled, with complication [facility])   [facility] Glean Hess, MD   3 months ago Acute non-recurrent frontal sinusitis   [facility] Glean Hess, MD   3 months ago Acute non-recurrent frontal sinusitis   [facility] Montel Culver, MD   5 months ago Essential (primary) hypertension   [facility] Glean Hess, MD       Future Appointments             In 2 months Army Melia Jesse Sans, MD [facility], [place]   In 6 months Army Melia, Jesse Sans, MD [facility], [facility]

## 2021-02-02 ENCOUNTER — Other Ambulatory Visit: Payer: Self-pay | Admitting: Internal Medicine

## 2021-02-03 NOTE — Telephone Encounter (Signed)
Requested Prescriptions  Pending Prescriptions Disp Refills   metFORMIN (GLUCOPHAGE-XR) 500 MG 24 hr tablet [Pharmacy Med Name: metFORMIN HCl ER Oral Tablet Extended Release 24 Hour 500 MG] 60 tablet 1    Sig: TAKE ONE TABLET BY MOUTH TWICE DAILY     Endocrinology:  Diabetes - Biguanides Failed - 02/02/2021 11:21 AM      Failed - Cr in normal range and within 360 days    Creatinine, Ser  Date Value Ref Range Status  07/25/2020 1.01 (H) 0.57 - 1.00 mg/dL Final         Failed - eGFR in normal range and within 360 days    GFR calc Af Amer  Date Value Ref Range Status  11/16/2019 72 >59 mL/min/1.73 Final    Comment:    **[facility] currently reports eGFR in compliance with the current**   recommendations of the [employer]. [facility] will   update reporting as new guidelines are published from the NKF-ASN   Task force.    GFR calc non Af Amer  Date Value Ref Range Status  11/16/2019 62 >59 mL/min/1.73 Final   eGFR  Date Value Ref Range Status  07/25/2020 57 (L) >59 mL/min/1.73 Final         Passed - HBA1C is between 0 and 7.9 and within 180 days    Hemoglobin A1C  Date Value Ref Range Status  12/01/2020 6.4 (A) 4.0 - 5.6 % Final   Hgb A1c MFr Bld  Date Value Ref Range Status  07/25/2020 6.7 (H) 4.8 - 5.6 % Final    Comment:             Prediabetes: 5.7 - 6.4          Diabetes: >6.4          Glycemic control for adults with diabetes: <7.0          Passed - Valid encounter within last 6 months    Recent Outpatient Visits          1 month ago Acute cough   [facility] Glean [facility], [facility]   2 months ago DM type 2, controlled, with complication [facility])   [facility] Glean Hess, MD   4 months ago Acute non-recurrent frontal sinusitis   [facility] Glean Hess, MD   4 months ago Acute non-recurrent frontal sinusitis   [facility] Montel Culver, MD   6 months ago Essential (primary) hypertension    [facility] Glean Hess, MD      Future Appointments            In 1 month Army Melia Jesse Sans, MD [facility], [place]   In 5 months Army Melia, Jesse Sans, MD [facility], [facility]

## 2021-03-28 LAB — HM DIABETES EYE EXAM

## 2021-04-03 ENCOUNTER — Other Ambulatory Visit: Payer: Self-pay

## 2021-04-03 ENCOUNTER — Encounter: Payer: Self-pay | Admitting: Internal Medicine

## 2021-04-03 ENCOUNTER — Ambulatory Visit (INDEPENDENT_AMBULATORY_CARE_PROVIDER_SITE_OTHER): Payer: Medicare Other | Admitting: Internal Medicine

## 2021-04-03 VITALS — BP 146/86 | HR 98 | Ht 63.0 in | Wt 213.0 lb

## 2021-04-03 DIAGNOSIS — F3341 Major depressive disorder, recurrent, in partial remission: Secondary | ICD-10-CM

## 2021-04-03 DIAGNOSIS — E785 Hyperlipidemia, unspecified: Secondary | ICD-10-CM

## 2021-04-03 DIAGNOSIS — E118 Type 2 diabetes mellitus with unspecified complications: Secondary | ICD-10-CM | POA: Diagnosis not present

## 2021-04-03 DIAGNOSIS — I10 Essential (primary) hypertension: Secondary | ICD-10-CM

## 2021-04-03 DIAGNOSIS — E1169 Type 2 diabetes mellitus with other specified complication: Secondary | ICD-10-CM | POA: Diagnosis not present

## 2021-04-03 NOTE — Progress Notes (Signed)
Date:  04/03/2021   Name:  Kimberly Walls   DOB:  1940-11-16   MRN:  [id number]   Chief Complaint: Diabetes and Hypertension  Diabetes She presents for her follow-up diabetic visit. She has type 2 diabetes mellitus. Her disease course has been stable. Pertinent negatives for hypoglycemia include no headaches or tremors. Pertinent negatives for diabetes include no chest pain, no fatigue, no polydipsia and no polyuria. Current diabetic treatment includes oral agent (dual therapy) (jardiance and metformin). She is compliant with treatment all of the time.  Hypertension This is a chronic problem. The problem is controlled. Pertinent negatives include no chest pain, headaches, palpitations or shortness of breath. Past treatments include calcium channel blockers, diuretics and ACE inhibitors. The current treatment provides significant improvement.  Depression        This is a chronic problem.The problem is unchanged.  Associated symptoms include no fatigue, no appetite change and no headaches.  Past treatments include SSRIs - Selective serotonin reuptake inhibitors.  Lab Results  Component Value Date   NA 138 07/25/2020   K 4.1 07/25/2020   CO2 24 07/25/2020   GLUCOSE 123 (H) 07/25/2020   BUN 23 07/25/2020   CREATININE 1.01 (H) 07/25/2020   CALCIUM 9.8 07/25/2020   EGFR 57 (L) 07/25/2020   GFRNONAA 62 11/16/2019   Lab Results  Component Value Date   CHOL 146 07/25/2020   HDL 39 (L) 07/25/2020   LDLCALC 65 07/25/2020   TRIG 265 (H) 07/25/2020   CHOLHDL 3.7 07/25/2020   Lab Results  Component Value Date   TSH 2.040 07/25/2020   Lab Results  Component Value Date   HGBA1C 6.4 (A) 12/01/2020   Lab Results  Component Value Date   WBC 7.3 07/25/2020   HGB 14.8 07/25/2020   HCT 45.1 07/25/2020   MCV 87 07/25/2020   PLT 257 07/25/2020   Lab Results  Component Value Date   ALT 23 07/25/2020   AST 32 07/25/2020   ALKPHOS 106 07/25/2020   BILITOT 0.5 07/25/2020   No results  found for: 25OHVITD2, 25OHVITD3, VD25OH   Review of Systems  Constitutional:  Negative for appetite change, fatigue, fever and unexpected weight change.  HENT:  Negative for tinnitus and trouble swallowing.   Eyes:  Negative for visual disturbance.  Respiratory:  Negative for cough, chest tightness and shortness of breath.   Cardiovascular:  Negative for chest pain, palpitations and leg swelling.  Gastrointestinal:  Negative for abdominal pain.  Endocrine: Negative for polydipsia and polyuria.  Genitourinary:  Negative for dysuria and hematuria.  Musculoskeletal:  Negative for arthralgias.  Neurological:  Negative for tremors, numbness and headaches.  Psychiatric/Behavioral:  Positive for depression. Negative for dysphoric mood.    Patient Active Problem List   Diagnosis Date Noted   Mild aortic stenosis 11/06/2020   Atypical chest pain 09/12/2020   BMI 38.0-38.9,adult 06/29/2015   History of pulmonary embolus (PE) 06/29/2015   DM type 2, controlled, with complication ([place]) 62/69/4854   Arthritis, degenerative 12/05/2014   DDD (degenerative disc disease), lumbar 08/29/2014   Hyperlipidemia associated with type 2 diabetes mellitus ([place]) 08/29/2014   Essential (primary) hypertension 08/29/2014   Acid reflux 08/29/2014   Herpes 08/29/2014   Obstructive sleep apnea of adult 08/29/2014   Depression, major, recurrent, in partial remission ([place]) 08/29/2014   Headache, tension-type 08/29/2014   Dermatophytosis of groin 08/29/2014   History of colon polyps 02/19/2008   Personal history of malignant neoplasm of breast 02/19/1991  Allergies  Allergen Reactions   Prochlorperazine    Pneumococcal Vaccine Rash   Tetanus Toxoids Rash    Past Surgical History:  Procedure Laterality Date   BUNIONECTOMY Bilateral    CARPAL TUNNEL RELEASE Left    LUMBAR LAMINECTOMY     MASTECTOMY MODIFIED RADICAL Bilateral 1993   PARTIAL HYSTERECTOMY     TOTAL KNEE ARTHROPLASTY Bilateral    TOTAL  SHOULDER REPLACEMENT Bilateral     Social History   Tobacco Use   Smoking status: Never   Smokeless tobacco: Never  Vaping Use   Vaping Use: Never used  Substance Use Topics   Alcohol use: Not Currently   Drug use: No     Medication list has been reviewed and updated.  Current Meds  Medication Sig   amLODipine (NORVASC) 5 MG tablet Take 1 tablet (5 mg total) by mouth daily.   amoxicillin (AMOXIL) 500 MG capsule Take 1,000 mg by mouth 2 (two) times daily. PRN for Dental Procedures   ascorbic acid (VITAMIN C) 250 MG tablet Take 250 mg by mouth daily.   aspirin 81 MG tablet Take 1 tablet by mouth daily.   atorvastatin (LIPITOR) 10 MG tablet TAKE ONE TABLET BY MOUTH ONCE DAILY   Biotin 2500 MCG CAPS Take 5,000 mcg by mouth daily.   Calcium Carb-Cholecalciferol (CALCIUM + D3) 600-200 MG-UNIT TABS Take 1 tablet by mouth daily.   Cinnamon 500 MG TABS Take 2 tablets by mouth daily.   Coenzyme Q10 (COQ10) 200 MG CAPS Take 1 capsule by mouth daily.   desloratadine (CLARINEX) 5 MG tablet TAKE ONE TABLET BY MOUTH ONCE DAILY   diazepam (VALIUM) 2 MG tablet Take 1 tablet (2 mg total) by mouth every 8 (eight) hours as needed for anxiety.   diclofenac Sodium (VOLTAREN) 1 % GEL Apply topically 4 (four) times daily.   erythromycin ophthalmic ointment Place 1 application into the right eye 3 (three) times daily.   escitalopram (LEXAPRO) 10 MG tablet TAKE ONE TABLET BY MOUTH ONCE DAILY   glucose blood test strip Use to test BS twice a day   hydrochlorothiazide (HYDRODIURIL) 25 MG tablet TAKE ONE TABLET BY MOUTH ONCE DAILY   JARDIANCE 10 MG TABS tablet Take 10 mg by mouth daily.   lisinopril (ZESTRIL) 40 MG tablet Take 1 tablet (40 mg total) by mouth daily.   metFORMIN (GLUCOPHAGE-XR) 500 MG 24 hr tablet TAKE ONE TABLET BY MOUTH TWICE DAILY   Multiple Vitamin (MULTIVITAMIN) capsule Take 1 capsule by mouth daily.   nystatin cream (MYCOSTATIN) Apply topically 2 (two) times daily. For rash under  breast and abdomen   omeprazole (PRILOSEC) 20 MG capsule TAKE ONE CAPSULE BY MOUTH ONCE DAILY   oxaprozin (DAYPRO) 600 MG tablet TAKE ONE TABLET BY MOUTH TWICE DAILY   zinc gluconate 50 MG tablet Take 50 mg by mouth daily.    PHQ 2/9 Scores 04/03/2021 12/01/2020 09/12/2020 07/25/2020  PHQ - 2 Score 0 1 5 2   PHQ- 9 Score 2 3 12 8     GAD 7 : Generalized Anxiety Score 04/03/2021 12/01/2020 09/12/2020 07/25/2020  Nervous, Anxious, on Edge 1 0 0 1  Control/stop worrying 1 1 2 1   Worry too much - different things 1 1 2 1   Trouble relaxing 0 0 0 1  Restless 1 0 0 0  Easily annoyed or irritable 0 0 0 0  Afraid - awful might happen 0 0 0 0  Total GAD 7 Score 4 2 4 4   Anxiety Difficulty  Not difficult at all Not difficult at all Somewhat difficult Not difficult at all    BP Readings from Last 3 Encounters:  04/03/21 (!) 146/86  12/29/20 120/70  12/01/20 (!) 144/80    Physical Exam Vitals and nursing note reviewed.  Constitutional:      General: She is not in acute distress.    Appearance: She is well-developed.  HENT:     Head: Normocephalic and atraumatic.  Cardiovascular:     Rate and Rhythm: Normal rate and regular rhythm.     Pulses: Normal pulses.  Pulmonary:     Effort: Pulmonary effort is normal. No respiratory distress.  Musculoskeletal:     Cervical back: Normal range of motion.     Right lower leg: No edema.     Left lower leg: No edema.  Skin:    General: Skin is warm and dry.     Capillary Refill: Capillary refill takes less than 2 seconds.     Findings: No rash.  Neurological:     General: No focal deficit present.     Mental Status: She is alert and oriented to person, place, and time.  Psychiatric:        Mood and Affect: Mood normal.        Behavior: Behavior normal.    Wt Readings from Last 3 Encounters:  04/03/21 213 lb (96.6 kg)  12/29/20 208 lb 3.2 oz (94.4 kg)  12/01/20 211 lb (95.7 kg)    BP (!) 146/86 (BP Location: Right Arm, Cuff Size: Large)     Pulse 98    Ht $R'5\' 3"'aA$  (1.6 m)    Wt 213 lb (96.6 kg)    SpO2 95%    BMI 37.73 kg/m   Assessment and Plan: 1. Essential (primary) hypertension Clinically stable exam with well controlled BP. Tolerating medications without side effects at this time. Pt to continue current regimen and low sodium diet; benefits of regular exercise as able discussed.  2. DM type 2, controlled, with complication ([place]) Clinically stable by exam and report without s/s of hypoglycemia. DM complicated by hypertension and dyslipidemia. Tolerating medications well without side effects or other concerns.  3. Hyperlipidemia associated with type 2 diabetes mellitus ([place]) Tolerating statin medication without side effects at this time LDL is at goal of < 70 on current dose Continue same therapy without change at this time.  4. Depression, major, recurrent, in partial remission ([place]) Clinically stable on current regimen with good control of symptoms, No SI or HI. Will continue current therapy with Lexapro 10 mg.   Partially dictated using Editor, commissioning. Any errors are unintentional.  Halina Maidens, MD [facility]  04/03/2021

## 2021-04-04 LAB — HEMOGLOBIN A1C
Est. average glucose Bld gHb Est-mCnc: 146 mg/dL
Hgb A1c MFr Bld: 6.7 % — ABNORMAL HIGH (ref 4.8–5.6)

## 2021-04-04 LAB — BASIC METABOLIC PANEL
BUN/Creatinine Ratio: 32 — ABNORMAL HIGH (ref 12–28)
BUN: 30 mg/dL — ABNORMAL HIGH (ref 8–27)
CO2: 23 mmol/L (ref 20–29)
Calcium: 9.5 mg/dL (ref 8.7–10.3)
Chloride: 103 mmol/L (ref 96–106)
Creatinine, Ser: 0.94 mg/dL (ref 0.57–1.00)
Glucose: 136 mg/dL — ABNORMAL HIGH (ref 70–99)
Potassium: 4.6 mmol/L (ref 3.5–5.2)
Sodium: 145 mmol/L — ABNORMAL HIGH (ref 134–144)
eGFR: 62 mL/min/{1.73_m2} (ref >59)

## 2021-04-04 LAB — MICROALBUMIN / CREATININE URINE RATIO
Creatinine, Urine: 36.1 mg/dL
Microalb/Creat Ratio: 16 mg/g creat (ref 0–29)
Microalbumin, Urine: 5.7 ug/mL

## 2021-04-07 ENCOUNTER — Other Ambulatory Visit: Payer: Self-pay | Admitting: Internal Medicine

## 2021-04-09 NOTE — Telephone Encounter (Signed)
Requested Prescriptions  Pending Prescriptions Disp Refills   metFORMIN (GLUCOPHAGE-XR) 500 MG 24 hr tablet [Pharmacy Med Name: metFORMIN HCl ER Oral Tablet Extended Release 24 Hour 500 MG] 180 tablet 0    Sig: TAKE ONE TABLET BY MOUTH TWICE DAILY     Endocrinology:  Diabetes - Biguanides Failed - 04/07/2021 10:40 AM      Failed - B12 Level in normal range and within 720 days    No results found for: VITAMINB12       Passed - Cr in normal range and within 360 days    Creatinine, Ser  Date Value Ref Range Status  04/03/2021 0.94 0.57 - 1.00 mg/dL Final         Passed - HBA1C is between 0 and 7.9 and within 180 days    Hgb A1c MFr Bld  Date Value Ref Range Status  04/03/2021 6.7 (H) 4.8 - 5.6 % Final    Comment:             Prediabetes: 5.7 - 6.4          Diabetes: >6.4          Glycemic control for adults with diabetes: <7.0          Passed - eGFR in normal range and within 360 days    GFR calc Af Amer  Date Value Ref Range Status  11/16/2019 72 >59 mL/min/1.73 Final    Comment:    **[facility] currently reports eGFR in compliance with the current**   recommendations of the [employer]. [facility] will   update reporting as new guidelines are published from the NKF-ASN   Task force.    GFR calc non Af Amer  Date Value Ref Range Status  11/16/2019 62 >59 mL/min/1.73 Final   eGFR  Date Value Ref Range Status  04/03/2021 62 >59 mL/min/1.73 Final         Passed - Valid encounter within last 6 months    Recent Outpatient Visits          6 days ago Essential (primary) hypertension   [facility] Glean Hess, [facility]   3 months ago Acute cough   [facility] Glean Hess, [facility]   4 months ago DM type 2, controlled, with complication [facility])   [facility] Glean Hess, MD   6 months ago Acute non-recurrent frontal sinusitis   [facility] Glean Hess, MD   6 months ago Acute non-recurrent frontal sinusitis    [facility] Montel Culver, MD      Future Appointments            In 3 months Glean Hess, MD [facility] [place] within normal limits and completed in the last 12 months    WBC  Date Value Ref Range Status  07/25/2020 7.3 3.4 - 10.8 x10E3/uL Final   RBC  Date Value Ref Range Status  07/25/2020 5.19 3.77 - 5.28 x10E6/uL Final   Hemoglobin  Date Value Ref Range Status  07/25/2020 14.8 11.1 - 15.9 g/dL Final   Hematocrit  Date Value Ref Range Status  07/25/2020 45.1 34.0 - 46.6 % Final   MCHC  Date Value Ref Range Status  07/25/2020 32.8 31.5 - 35.7 g/dL Final   [facility]  Date Value Ref Range Status  07/25/2020 28.5 26.6 - 33.0 pg Final   MCV  Date Value Ref Range Status  07/25/2020 87 79 - 97 fL Final   No results found for: PLTCOUNTKUC, LABPLAT, POCPLA RDW  Date Value Ref Range Status  07/25/2020 13.7 11.7 - 15.4 % Final

## 2021-04-18 ENCOUNTER — Other Ambulatory Visit: Payer: Self-pay | Admitting: Internal Medicine

## 2021-04-18 DIAGNOSIS — F3341 Major depressive disorder, recurrent, in partial remission: Secondary | ICD-10-CM

## 2021-04-18 NOTE — Telephone Encounter (Signed)
Requested Prescriptions  ?Pending Prescriptions Disp Refills  ?? escitalopram (LEXAPRO) 10 MG tablet [Pharmacy Med Name: Escitalopram Oxalate Oral Tablet 10 MG] 90 tablet 0  ?  Sig: TAKE ONE TABLET BY MOUTH ONCE DAILY  ?  ? Psychiatry:  Antidepressants - SSRI Passed - 04/18/2021 11:58 AM  ?  ?  Passed - Completed PHQ-2 or PHQ-9 in the last 360 days  ?  ?  Passed - Valid encounter within last 6 months  ?  Recent Outpatient Visits   ?      ? 2 weeks ago Essential (primary) hypertension  ? [facility] Glean Hess, MD  ? 3 months ago Acute cough  ? [facility] Glean Hess, MD  ? 4 months ago DM type 2, controlled, with complication [facility])  ? [facility] Glean Hess, MD  ? 7 months ago Acute non-recurrent frontal sinusitis  ? [facility] Glean Hess, MD  ? 7 months ago Acute non-recurrent frontal sinusitis  ? [facility] Montel Culver, MD  ?  ?  ?Future Appointments   ?        ? In 3 months Army Melia Jesse Sans, MD [facility] [place]  ?  ? ?  ?  ?  ? ? ?

## 2021-05-07 ENCOUNTER — Other Ambulatory Visit: Payer: Self-pay | Admitting: Internal Medicine

## 2021-05-09 NOTE — Telephone Encounter (Signed)
Requested Prescriptions  ?Pending Prescriptions Disp Refills  ?? desloratadine (CLARINEX) 5 MG tablet [Pharmacy Med Name: Desloratadine Oral Tablet 5 MG] 90 tablet 3  ?  Sig: TAKE ONE TABLET BY MOUTH ONCE DAILY  ?  ? Ear, Nose, and Throat:  Antihistamines Passed - 05/07/2021  3:39 PM  ?  ?  Passed - Valid encounter within last 12 months  ?  Recent Outpatient Visits   ?      ? 1 month ago Essential (primary) hypertension  ? [facility] Glean Hess, MD  ? 4 months ago Acute cough  ? [facility] Glean Hess, MD  ? 5 months ago DM type 2, controlled, with complication [facility])  ? [facility] Glean Hess, [facility]  ? 7 months ago Acute non-recurrent frontal sinusitis  ? [facility] Glean Hess, MD  ? 7 months ago Acute non-recurrent frontal sinusitis  ? [facility] Montel Culver, MD  ?  ?  ?Future Appointments   ?        ? In 2 months Army Melia Jesse Sans, MD [facility], [place]  ?  ? ?  ?  ?  ? ?

## 2021-06-06 ENCOUNTER — Other Ambulatory Visit: Payer: Self-pay | Admitting: Internal Medicine

## 2021-06-06 DIAGNOSIS — E785 Hyperlipidemia, unspecified: Secondary | ICD-10-CM

## 2021-06-06 DIAGNOSIS — I10 Essential (primary) hypertension: Secondary | ICD-10-CM

## 2021-06-06 NOTE — Telephone Encounter (Signed)
Requested Prescriptions  ?Pending Prescriptions Disp Refills  ?? omeprazole (PRILOSEC) 20 MG capsule [Pharmacy Med Name: Omeprazole Oral Capsule Delayed Release 20 MG] 90 capsule 0  ?  Sig: TAKE ONE CAPSULE BY MOUTH ONCE DAILY  ?  ? Gastroenterology: Proton Pump Inhibitors Passed - 06/06/2021 10:05 AM  ?  ?  Passed - Valid encounter within last 12 months  ?  Recent Outpatient Visits   ?      ? 2 months ago Essential (primary) hypertension  ? [facility] Glean Hess, MD  ? 5 months ago Acute cough  ? [facility] Glean Hess, MD  ? 6 months ago DM type 2, controlled, with complication [facility])  ? [facility] Glean Hess, MD  ? 8 months ago Acute non-recurrent frontal sinusitis  ? [facility] Glean Hess, MD  ? 8 months ago Acute non-recurrent frontal sinusitis  ? [facility] Montel Culver, MD  ?  ?  ?Future Appointments   ?        ? In 1 month Army Melia Jesse Sans, MD [facility]  ?  ? ?  ?  ?  ?? hydrochlorothiazide (HYDRODIURIL) 25 MG tablet [Pharmacy Med Name: hydroCHLOROthiazide Oral Tablet 25 MG] 90 tablet 0  ?  Sig: TAKE ONE TABLET BY MOUTH ONCE DAILY  ?  ? Cardiovascular: Diuretics - Thiazide Failed - 06/06/2021 10:05 AM  ?  ?  Failed - Na in normal range and within 180 days  ?  Sodium  ?Date Value Ref Range Status  ?04/03/2021 145 (H) 134 - 144 mmol/L Final  ?   ?  ?  Failed - Last BP in normal range  ?  BP Readings from Last 1 Encounters:  ?04/03/21 (!) 146/86  ?   ?  ?  Passed - Cr in normal range and within 180 days  ?  Creatinine, Ser  ?Date Value Ref Range Status  ?04/03/2021 0.94 0.57 - 1.00 mg/dL Final  ?   ?  ?  Passed - K in normal range and within 180 days  ?  Potassium  ?Date Value Ref Range Status  ?04/03/2021 4.6 3.5 - 5.2 mmol/L Final  ?   ?  ?  Passed - Valid encounter within last 6 months  ?  Recent Outpatient Visits   ?      ? 2 months ago Essential (primary) hypertension  ? [facility] Glean Hess, MD  ? 5 months ago Acute cough  ? [facility] Glean Hess, MD  ? 6 months ago DM type 2, controlled, with complication [facility])  ? [facility] Glean Hess, MD  ? 8 months ago Acute non-recurrent frontal sinusitis  ? [facility] Glean Hess, MD  ? 8 months ago Acute non-recurrent frontal sinusitis  ? [facility] Montel Culver, MD  ?  ?  ?Future Appointments   ?        ? In 1 month Army Melia Jesse Sans, MD [facility]  ?  ? ?  ?  ?  ?? atorvastatin (LIPITOR) 10 MG tablet [Pharmacy Med Name: Atorvastatin Calcium Oral Tablet 10 MG] 90 tablet 0  ?  Sig: TAKE ONE TABLET BY MOUTH ONCE DAILY  ?  ? Cardiovascular:  Antilipid - Statins Failed - 06/06/2021 10:05 AM  ?  ?  Failed - Lipid Panel in normal range within the last 12  months  ?  Cholesterol, Total  ?Date Value Ref Range Status  ?07/25/2020 146 100 - 199 mg/dL Final  ? ?LDL Chol Calc (NIH)  ?Date Value Ref Range Status  ?07/25/2020 65 0 - 99 mg/dL Final  ? ?HDL  ?Date Value Ref Range Status  ?07/25/2020 39 (L) >39 mg/dL Final  ? ?Triglycerides  ?Date Value Ref Range Status  ?07/25/2020 265 (H) 0 - 149 mg/dL Final  ? ?  ?  ?  Passed - Patient is not pregnant  ?  ?  Passed - Valid encounter within last 12 months  ?  Recent Outpatient Visits   ?      ? 2 months ago Essential (primary) hypertension  ? [facility] Glean Hess, MD  ? 5 months ago Acute cough  ? [facility] Glean Hess, MD  ? 6 months ago DM type 2, controlled, with complication [facility])  ? [facility] Glean Hess, MD  ? 8 months ago Acute non-recurrent frontal sinusitis  ? [facility] Glean Hess, MD  ? 8 months ago Acute non-recurrent frontal sinusitis  ? [facility] Montel Culver, MD  ?  ?  ?Future Appointments   ?        ? In 1 month Army Melia Jesse Sans, MD [facility], [place]  ?  ? ?  ?  ?  ? ?

## 2021-06-12 ENCOUNTER — Ambulatory Visit: Payer: Self-pay

## 2021-06-12 NOTE — Telephone Encounter (Signed)
Spoke to pt she went to [facility]. They gave her a antibiotic. Told pt to complete medication and call us back it she needs to be seen if she doesn't feel better to call us to be seen. ? ?KP ?

## 2021-06-12 NOTE — Telephone Encounter (Signed)
?  Chief Complaint: Cough and night sweats ?Symptoms: Cough, night sweats sore throat and congestion ?Frequency: 4/20 ?Pertinent Negatives: Patient denies SOB ?Disposition: '[]'$ ED /'[x]'$ Urgent Care (no appt availability in office) / '[]'$ Appointment(In office/virtual)/ '[]'$  [place] [facility]/ '[]'$ Home Care/ '[]'$ Refused Recommended Disposition /'[]'$ [place] [facility]/ '[]'$  Follow-up with PCP ?Additional Notes: Pt was seen at [facility] and given prednisone. Pt was not given anything for cough. Pt has a deep chested wet sounding cough. Pt is also reporting night sweats.  ? ? ? ? ?Summary: dazed feeling  ? Pt called to schedule an appt for cough and congestion / she also mentioned that she has been having headaches and feeling dazed. / please advise  ?Nurse Lonn Georgia advised to place clinical call   ?  ? ?Reason for Disposition ? Fever present > 3 days (72 hours) ? ?Answer Assessment - Initial Assessment Questions ?1. ONSET: "When did the cough begin?"  ?    4/20 ?2. SEVERITY: "How bad is the cough today?"  ?    Deep in the chest ?3. SPUTUM: "Describe the color of your sputum" (none, dry cough; clear, white, yellow, green) ?    yellow ?4. HEMOPTYSIS: "Are you coughing up any blood?" If so ask: "How much?" (flecks, streaks, tablespoons, etc.) ?    no ?5. DIFFICULTY BREATHING: "Are you having difficulty breathing?" If Yes, ask: "How bad is it?" (e.g., mild, moderate, severe)  ?  - MILD: No SOB at rest, mild SOB with walking, speaks normally in sentences, can lie down, no retractions, pulse < 100.  ?  - MODERATE: SOB at rest, SOB with minimal exertion and prefers to sit, cannot lie down flat, speaks in phrases, mild retractions, audible wheezing, pulse 100-120.  ?  - SEVERE: Very SOB at rest, speaks in single words, struggling to breathe, sitting hunched forward, retractions, pulse > 120  ?    no ?6. FEVER: "Do you have a fever?" If Yes, ask: "What is your temperature, how was it measured, and when did it start?" ?    No fever - but having  night sweats ?7. CARDIAC HISTORY: "Do you have any history of heart disease?" (e.g., heart attack, congestive heart failure)  ?    No - HT ?8. LUNG HISTORY: "Do you have any history of lung disease?"  (e.g., pulmonary embolus, asthma, emphysema) ?    no ?9. PE RISK FACTORS: "Do you have a history of blood clots?" (or: recent major surgery, recent prolonged travel, bedridden) ?    no ?10. OTHER SYMPTOMS: "Do you have any other symptoms?" (e.g., runny nose, wheezing, chest pain) ?       ?11. PREGNANCY: "Is there any chance you are pregnant?" "When was your last menstrual period?" ?       ?12. TRAVEL: "Have you traveled out of the country in the last month?" (e.g., travel history, exposures) ? ?Protocols used: Cough - Acute Productive-A-AH ? ?

## 2021-06-14 ENCOUNTER — Ambulatory Visit: Payer: Medicare Other | Admitting: Internal Medicine

## 2021-06-15 ENCOUNTER — Ambulatory Visit: Payer: Self-pay | Admitting: *Deleted

## 2021-06-15 NOTE — Telephone Encounter (Signed)
I returned pt's call.   C/o coughing since 06/12/2021 continuously.  C/o soreness and discomfort.   Requesting rx for coughing. ? ? ?Reason for Disposition ? [1] Continuous (nonstop) coughing interferes with work or school AND [2] no improvement using cough treatment per Care Advice ? ?Answer Assessment - Initial Assessment Questions ?1. ONSET: "When did the cough begin?"  ?    It started Tuesday with the antibiotics.   My chest is sore.  Last week I had a sore throat and then went away and I got better.   I went to work Tues and was sent home   I went to [facility] on Friday last week and Tuesday this week and gave me antibiotics but nothing for the coughing.   I can't stop it.   I tried Delsym and it didn't do a thing.   I'm still on the antibiotics.     ?2. SEVERITY: "How bad is the cough today?"  ?    Continuous    [facility] took x rays and it was clear ?3. SPUTUM: "Describe the color of your sputum" (none, dry cough; clear, white, yellow, green) ?    Yellow sputum occasionally ?4. HEMOPTYSIS: "Are you coughing up any blood?" If so ask: "How much?" (flecks, streaks, tablespoons, etc.) ?    No ?5. DIFFICULTY BREATHING: "Are you having difficulty breathing?" If Yes, ask: "How bad is it?" (e.g., mild, moderate, severe)  ?  - MILD: No SOB at rest, mild SOB with walking, speaks normally in sentences, can lie down, no retractions, pulse < 100.  ?  - MODERATE: SOB at rest, SOB with minimal exertion and prefers to sit, cannot lie down flat, speaks in phrases, mild retractions, audible wheezing, pulse 100-120.  ?  - SEVERE: Very SOB at rest, speaks in single words, struggling to breathe, sitting hunched forward, retractions, pulse > 120  ?    I've been in bed most of the time. ?6. FEVER: "Do you have a fever?" If Yes, ask: "What is your temperature, how was it measured, and when did it start?" ?    No ?7. CARDIAC HISTORY: "Do you have any history of heart disease?" (e.g., heart attack, congestive heart failure)  ?    *No  Answer* ?8. LUNG HISTORY: "Do you have any history of lung disease?"  (e.g., pulmonary embolus, asthma, emphysema) ?    No   Never smoked ?9. PE RISK FACTORS: "Do you have a history of blood clots?" (or: recent major surgery, recent prolonged travel, bedridden) ?    *No Answer* ?10. OTHER SYMPTOMS: "Do you have any other symptoms?" (e.g., runny nose, wheezing, chest pain) ?      My nose is a little runny.   No sore throat ?11. PREGNANCY: "Is there any chance you are pregnant?" "When was your last menstrual period?" ?      N/A ?12. TRAVEL: "Have you traveled out of the country in the last month?" (e.g., travel history, exposures) ?      Not asked ? ?Protocols used: Cough - Acute Productive-A-AH ? ?

## 2021-06-15 NOTE — Telephone Encounter (Signed)
?  Chief Complaint: Non stop coughing   Seen [facility], on antibiotics but nothing given for coughing.  OTC not working ?Symptoms: coughing a lot with occasional yellow mucus.  Requesting something stronger for the coughing. ?Frequency: for the last couple of weeks total ?Pertinent Negatives: Patient denies fever, sore throat or shortness of breath. ?Disposition: '[]'$ ED /'[]'$ Urgent Care (no appt availability in office) / '[x]'$ Appointment(In office/virtual)/ '[]'$  [place] [facility]/ '[]'$ Home Care/ '[]'$ Refused Recommended Disposition /'[]'$ [place] [facility]/ '[]'$  Follow-up with PCP ?Additional Notes: Appt made for next Tuesday with Dr. Army Melia with directions to go back to the urgent care over the weekend if she gets worse.   Pt. Agreeable to this plan.  ?

## 2021-06-19 ENCOUNTER — Ambulatory Visit (INDEPENDENT_AMBULATORY_CARE_PROVIDER_SITE_OTHER): Payer: Medicare Other | Admitting: Internal Medicine

## 2021-06-19 ENCOUNTER — Encounter: Payer: Self-pay | Admitting: Internal Medicine

## 2021-06-19 VITALS — BP 140/84 | HR 88 | Temp 97.9°F | Ht 63.0 in | Wt 200.4 lb

## 2021-06-19 DIAGNOSIS — J0100 Acute maxillary sinusitis, unspecified: Secondary | ICD-10-CM | POA: Diagnosis not present

## 2021-06-19 DIAGNOSIS — R051 Acute cough: Secondary | ICD-10-CM

## 2021-06-19 MED ORDER — AMOXICILLIN-POT CLAVULANATE 875-125 MG PO TABS: 1.0000 | ORAL_TABLET | Freq: Two times a day (BID) | ORAL | 0 refills | Status: AC

## 2021-06-19 MED ORDER — PROMETHAZINE-DM 6.25-15 MG/5ML PO SYRP: 5.0000 mL | ORAL_SOLUTION | Freq: Four times a day (QID) | ORAL | 0 refills | Status: AC | PRN

## 2021-06-19 NOTE — Progress Notes (Signed)
? ? ?Date:  06/19/2021  ? ?Name:  Kimberly Walls   DOB:  [birth year]   MRN:  [id number] ? ? ?Chief Complaint: Cough ? ?Cough ?This is a new problem. The current episode started 1 to 4 weeks ago. The problem has been waxing and waning. The cough is Productive of sputum. Associated symptoms include chest pain, chills, myalgias, postnasal drip, rhinorrhea, a sore throat and sweats. Pertinent negatives include no ear pain, fever, headaches, nasal congestion, shortness of breath or wheezing.  ?She started with head congestion and drainage now with more cough.  Initially only treated with prednisone which raised her BS.  Then treated with Doxycycline for 10 days.  She has had minimal benefit. ? ?Lab Results  ?Component Value Date  ? NA 145 (H) 04/03/2021  ? K 4.6 04/03/2021  ? CO2 23 04/03/2021  ? GLUCOSE 136 (H) 04/03/2021  ? BUN 30 (H) 04/03/2021  ? CREATININE 0.94 04/03/2021  ? CALCIUM 9.5 04/03/2021  ? EGFR 62 04/03/2021  ? GFRNONAA 62 11/16/2019  ? ?Lab Results  ?Component Value Date  ? CHOL 146 07/25/2020  ? HDL 39 (L) 07/25/2020  ? [place] 65 07/25/2020  ? TRIG 265 (H) 07/25/2020  ? CHOLHDL 3.7 07/25/2020  ? ?Lab Results  ?Component Value Date  ? TSH 2.040 07/25/2020  ? ?Lab Results  ?Component Value Date  ? HGBA1C 6.7 (H) 04/03/2021  ? ?Lab Results  ?Component Value Date  ? WBC 7.3 07/25/2020  ? HGB 14.8 07/25/2020  ? HCT 45.1 07/25/2020  ? MCV 87 07/25/2020  ? PLT 257 07/25/2020  ? ?Lab Results  ?Component Value Date  ? ALT 23 07/25/2020  ? AST 32 07/25/2020  ? ALKPHOS 106 07/25/2020  ? BILITOT 0.5 07/25/2020  ? ?No results found for: 25OHVITD2, [place], VD25OH  ? ?Review of Systems  ?Constitutional:  Positive for chills, diaphoresis and fatigue. Negative for fever.  ?HENT:  Positive for congestion, postnasal drip, rhinorrhea and sore throat. Negative for ear pain and trouble swallowing.   ?Eyes:  Negative for visual disturbance.  ?Respiratory:  Positive for cough. Negative for chest tightness, shortness of breath  and wheezing.   ?Cardiovascular:  Positive for chest pain. Negative for palpitations.  ?Musculoskeletal:  Positive for myalgias.  ?Neurological:  Negative for dizziness, light-headedness and headaches.  ? ?Patient Active Problem List  ? Diagnosis Date Noted  ? Mild aortic stenosis 11/06/2020  ? Atypical chest pain 09/12/2020  ? BMI 38.0-38.9,adult 06/29/2015  ? History of pulmonary embolus (PE) 06/29/2015  ? DM type 2, controlled, with complication ([place]) 93/81/8299  ? Arthritis, degenerative 12/05/2014  ? DDD (degenerative disc disease), lumbar 08/29/2014  ? Hyperlipidemia associated with type 2 diabetes mellitus ([place]) 08/29/2014  ? Essential (primary) hypertension 08/29/2014  ? Acid reflux 08/29/2014  ? Herpes 08/29/2014  ? Obstructive sleep apnea of adult 08/29/2014  ? Depression, major, recurrent, in partial remission ([place]) 08/29/2014  ? Headache, tension-type 08/29/2014  ? Dermatophytosis of groin 08/29/2014  ? History of colon polyps 02/19/2008  ? Personal history of malignant neoplasm of breast 02/19/1991  ? ? ?Allergies  ?Allergen Reactions  ? Prochlorperazine   ? Pneumococcal Vaccine Rash  ? Tetanus Toxoids Rash  ? ? ?Past Surgical History:  ?Procedure Laterality Date  ? BUNIONECTOMY Bilateral   ? CARPAL TUNNEL RELEASE Left   ? LUMBAR LAMINECTOMY    ? MASTECTOMY MODIFIED RADICAL Bilateral 1993  ? PARTIAL HYSTERECTOMY    ? TOTAL KNEE ARTHROPLASTY Bilateral   ? TOTAL SHOULDER REPLACEMENT  Bilateral   ? ? ?Social History  ? ?Tobacco Use  ? Smoking status: Never  ? Smokeless tobacco: Never  ?Vaping Use  ? Vaping Use: Never used  ?Substance Use Topics  ? Alcohol use: Not Currently  ? Drug use: No  ? ? ? ?Medication list has been reviewed and updated. ? ?Current Meds  ?Medication Sig  ? amLODipine (NORVASC) 5 MG tablet Take 1 tablet (5 mg total) by mouth daily.  ? amoxicillin-clavulanate (AUGMENTIN) 875-125 MG tablet Take 1 tablet by mouth 2 (two) times daily for 10 days.  ? ascorbic acid (VITAMIN C) 250 MG tablet  Take 250 mg by mouth daily.  ? aspirin 81 MG tablet Take 1 tablet by mouth daily.  ? atorvastatin (LIPITOR) 10 MG tablet TAKE ONE TABLET BY MOUTH ONCE DAILY  ? Biotin 2500 MCG CAPS Take 5,000 mcg by mouth daily.  ? Calcium Carb-Cholecalciferol (CALCIUM + D3) 600-200 MG-UNIT TABS Take 1 tablet by mouth daily.  ? cetirizine (ZYRTEC) 5 MG tablet Take 5 mg by mouth daily.  ? Cinnamon 500 MG TABS Take 2 tablets by mouth daily.  ? Coenzyme Q10 (COQ10) 200 MG CAPS Take 1 capsule by mouth daily.  ? diazepam (VALIUM) 2 MG tablet Take 1 tablet (2 mg total) by mouth every 8 (eight) hours as needed for anxiety.  ? diclofenac Sodium (VOLTAREN) 1 % GEL Apply topically 4 (four) times daily.  ? doxycycline (VIBRAMYCIN) 100 MG capsule Take 100 mg by mouth 2 (two) times daily.  ? erythromycin ophthalmic ointment Place 1 application into the right eye 3 (three) times daily.  ? escitalopram (LEXAPRO) 10 MG tablet TAKE ONE TABLET BY MOUTH ONCE DAILY  ? glucose blood test strip Use to test BS twice a day  ? hydrochlorothiazide (HYDRODIURIL) 25 MG tablet TAKE ONE TABLET BY MOUTH ONCE DAILY  ? JARDIANCE 10 MG TABS tablet Take 10 mg by mouth daily.  ? lisinopril (ZESTRIL) 40 MG tablet Take 1 tablet (40 mg total) by mouth daily.  ? metFORMIN (GLUCOPHAGE-XR) 500 MG 24 hr tablet TAKE ONE TABLET BY MOUTH TWICE DAILY  ? Multiple Vitamin (MULTIVITAMIN) capsule Take 1 capsule by mouth daily.  ? nystatin cream (MYCOSTATIN) Apply topically 2 (two) times daily. For rash under breast and abdomen  ? omeprazole (PRILOSEC) 20 MG capsule TAKE ONE CAPSULE BY MOUTH ONCE DAILY  ? oxaprozin (DAYPRO) 600 MG tablet TAKE ONE TABLET BY MOUTH TWICE DAILY  ? promethazine-dextromethorphan (PROMETHAZINE-DM) 6.25-15 MG/5ML syrup Take 5 mLs by mouth 4 (four) times daily as needed for up to 9 days for cough.  ? zinc gluconate 50 MG tablet Take 50 mg by mouth daily.  ? ? ? ?  06/19/2021  ?  1:32 PM 04/03/2021  ? 10:28 AM 12/01/2020  ? 10:14 AM 09/12/2020  ? 11:48 AM  ?GAD 7  : Generalized Anxiety Score  ?Nervous, Anxious, on Edge 1 1 0 0  ?Control/stop worrying _0 ?Worry too much - different things _1 ?Trouble relaxing 0 0 0 0  ?Restless 0 1 0 0  ?Easily annoyed or irritable 0 0 0 0  ?Afraid - awful might happen 0 0 0 0  ?Total GAD 7 Score _2 ?Anxiety Difficulty Not difficult at all Not difficult at all Not difficult at all Somewhat difficult  ? ? ? ?  06/19/2021  ?  1:31 PM  ?Depression screen PHQ 2/9  ?Decreased Interest 1  ?Down, Depressed,  Hopeless 1  ?PHQ - 2 Score 2  ?Altered sleeping 0  ?Tired, decreased energy 1  ?Change in appetite 0  ?Feeling bad or failure about yourself  0  ?Trouble concentrating 0  ?Moving slowly or fidgety/restless 0  ?Suicidal thoughts 0  ?PHQ-9 Score 3  ?Difficult doing work/chores Not difficult at all  ? ? ?BP Readings from Last 3 Encounters:  ?06/19/21 140/84  ?04/03/21 (!) 146/86  ?12/29/20 120/70  ? ? ?Physical Exam ?Constitutional:   ?   Appearance: Normal appearance. She is well-developed. She is ill-appearing.  ?HENT:  ?   Right Ear: Ear canal and external ear normal. Tympanic membrane is retracted. Tympanic membrane is not erythematous.  ?   Left Ear: Ear canal and external ear normal. Tympanic membrane is retracted. Tympanic membrane is not erythematous.  ?   Nose:  ?   Right Sinus: Maxillary sinus tenderness and frontal sinus tenderness present.  ?   Left Sinus: Maxillary sinus tenderness and frontal sinus tenderness present.  ?   Mouth/Throat:  ?   Mouth: No oral lesions.  ?   Pharynx: Uvula midline. Posterior oropharyngeal erythema present. No oropharyngeal exudate.  ?Cardiovascular:  ?   Rate and Rhythm: Normal rate and regular rhythm.  ?   Pulses: Normal pulses.  ?   Heart sounds: Normal heart sounds.  ?Pulmonary:  ?   Effort: Pulmonary effort is normal.  ?   Breath sounds: Normal breath sounds. No wheezing, rhonchi or rales.  ?Musculoskeletal:  ?   Cervical back: Normal range of motion.  ?Lymphadenopathy:  ?   Cervical: No  cervical adenopathy.  ?Skin: ?   Capillary Refill: Capillary refill takes less than 2 seconds.  ?Neurological:  ?   Mental Status: She is alert and oriented to person, place, and time.  ? ? ?Wt Readings

## 2021-06-19 NOTE — Patient Instructions (Signed)
Coricidin HBP - take as directed on the box ?

## 2021-07-02 ENCOUNTER — Other Ambulatory Visit: Payer: Self-pay | Admitting: Internal Medicine

## 2021-07-03 IMAGING — CR DG CHEST 2V
2 series · 4 of 4 positions shown · non-contrast
Comparison: 07/20/2018

CLINICAL DATA: Chest pain

EXAM:
CHEST - 2 VIEW

[Series 1: chest pa · 0.14mm/px · 2 of 2 slices shown]
[im 1/2]
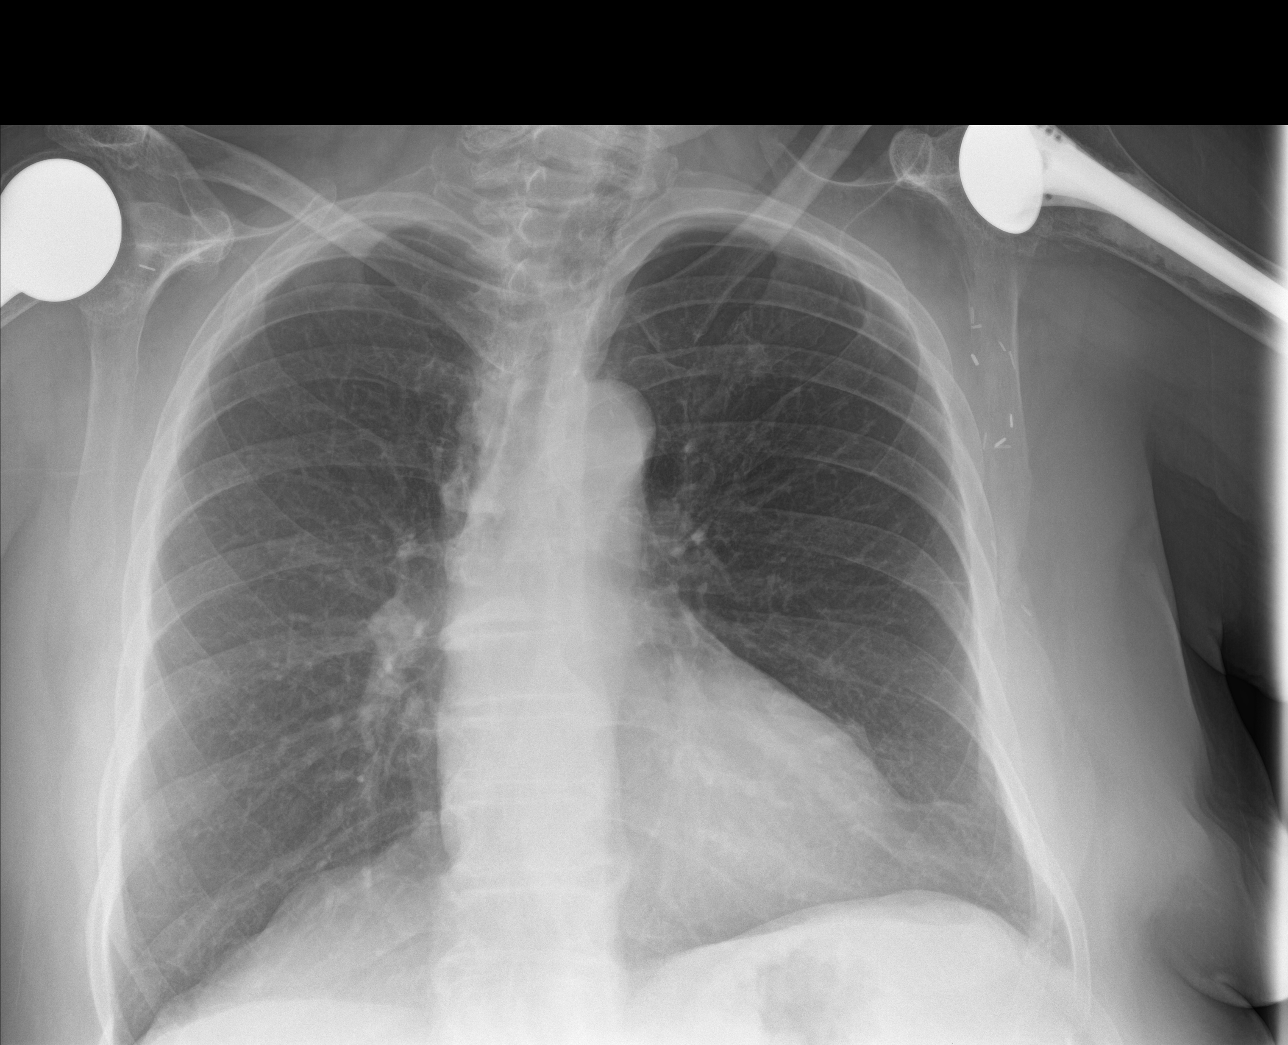
[im 2/2]
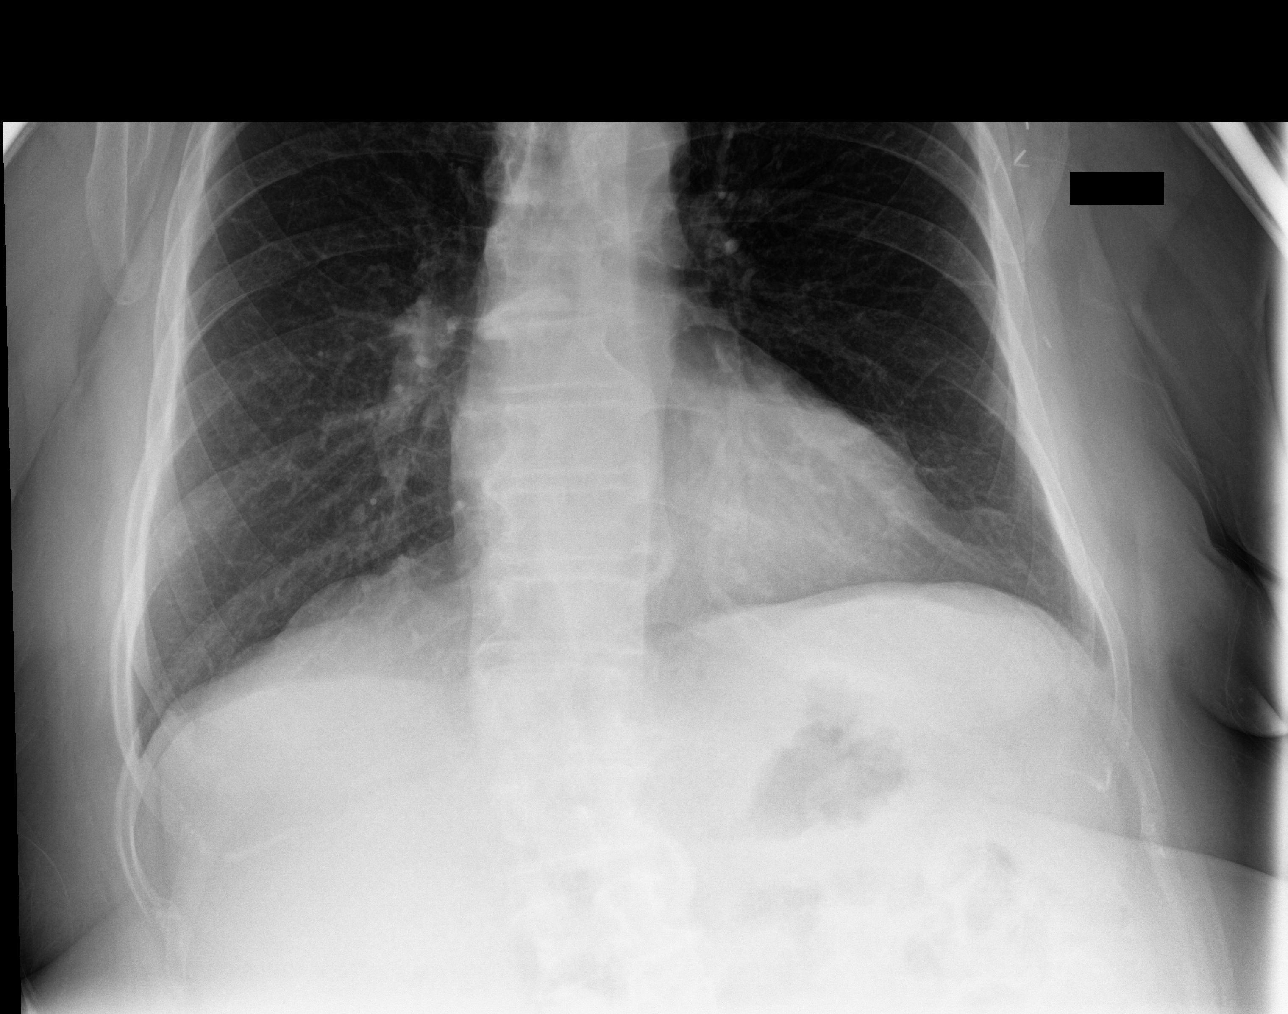

[Series 2: chest lat · 0.14mm/px · 2 of 2 slices shown]
[im 1/2]
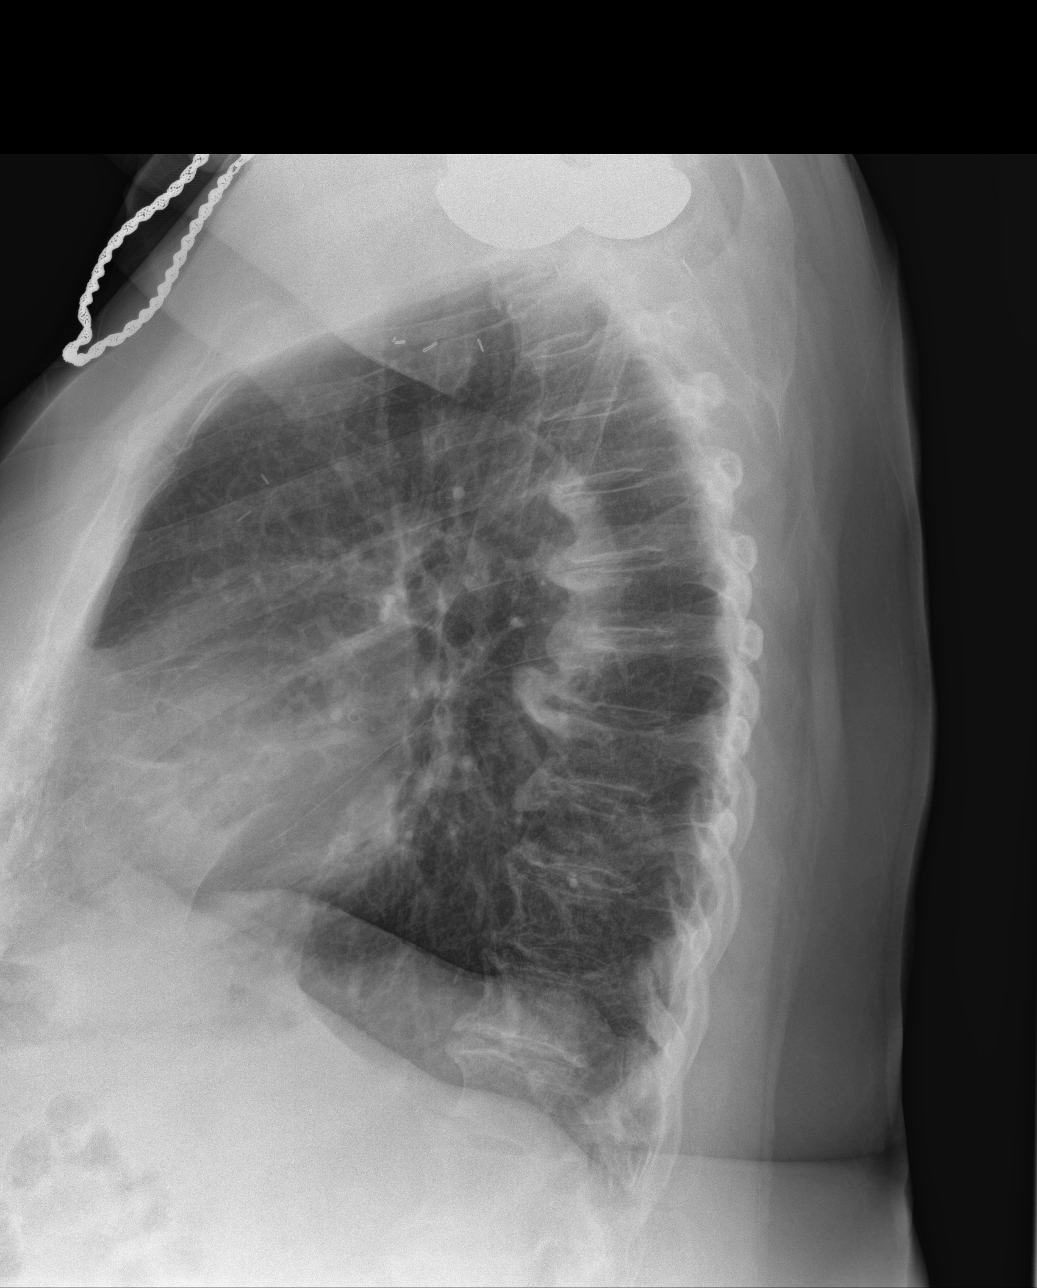
[im 2/2]
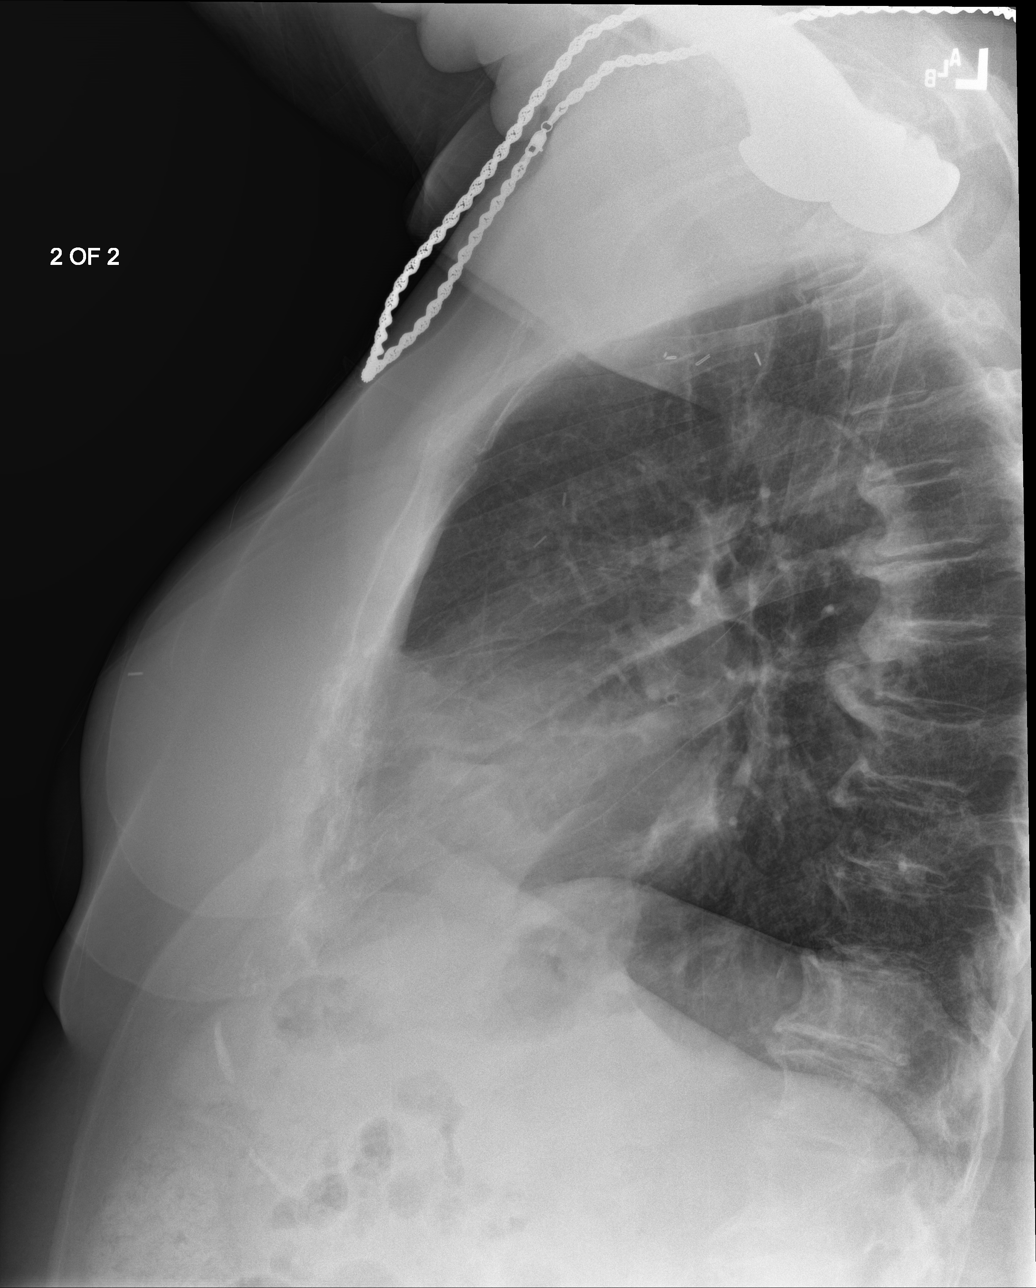

[4 of 4 positions shown; findings below may reference images not displayed]

FINDINGS: Bilateral shoulder replacements. No focal opacity or pleural
effusion. Stable cardiomediastinal silhouette. No pneumothorax.
Degenerative changes of the spine. Postsurgical changes in the left
axilla.
IMPRESSION: No active cardiopulmonary disease.

## 2021-07-04 ENCOUNTER — Other Ambulatory Visit: Payer: Self-pay | Admitting: Internal Medicine

## 2021-07-04 NOTE — Telephone Encounter (Signed)
rx was sent to same oharmacy today #180/0 ?E-Prescribing Status: Receipt confirmed by pharmacy (07/04/2021 ?7:45 AM EDT) ?Requested Prescriptions  ?Pending Prescriptions Disp Refills  ?? metFORMIN (GLUCOPHAGE-XR) 500 MG 24 hr tablet [Pharmacy Med Name: metFORMIN HCl ER Oral Tablet Extended Release 24 Hour 500 MG] 180 tablet 0  ?  Sig: TAKE ONE TABLET BY MOUTH TWICE DAILY  ?  ? Endocrinology:  Diabetes - Biguanides Failed - 07/04/2021 11:31 AM  ?  ?  Failed - B12 Level in normal range and within 720 days  ?  No results found for: VITAMINB12   ?  ?  Passed - Cr in normal range and within 360 days  ?  Creatinine, Ser  ?Date Value Ref Range Status  ?04/03/2021 0.94 0.57 - 1.00 mg/dL Final  ?   ?  ?  Passed - HBA1C is between 0 and 7.9 and within 180 days  ?  Hgb A1c MFr Bld  ?Date Value Ref Range Status  ?04/03/2021 6.7 (H) 4.8 - 5.6 % Final  ?  Comment:  ?           Prediabetes: 5.7 - 6.4 ?         Diabetes: >6.4 ?         Glycemic control for adults with diabetes: <7.0 ?  ?   ?  ?  Passed - eGFR in normal range and within 360 days  ?  GFR calc Af Amer  ?Date Value Ref Range Status  ?11/16/2019 72 >59 mL/min/1.73 Final  ?  Comment:  ?  **[facility] currently reports eGFR in compliance with the current** ?  recommendations of the [employer]. [facility] will ?  update reporting as new guidelines are published from the NKF-ASN ?  Task force. ?  ? ?GFR calc non Af Amer  ?Date Value Ref Range Status  ?11/16/2019 62 >59 mL/min/1.73 Final  ? ?eGFR  ?Date Value Ref Range Status  ?04/03/2021 62 >59 mL/min/1.73 Final  ?   ?  ?  Passed - Valid encounter within last 6 months  ?  Recent Outpatient Visits   ?      ? 2 weeks ago Acute non-recurrent maxillary sinusitis  ? [facility] Glean Hess, [facility]  ? 3 months ago Essential (primary) hypertension  ? [facility] Glean Hess, MD  ? 6 months ago Acute cough  ? [facility] Glean Hess, [facility]  ? 7 months ago DM type 2, controlled, with  complication [facility]  ? [facility] Glean Hess, [facility]  ? 9 months ago Acute non-recurrent frontal sinusitis  ? [facility] Glean Hess, [facility]  ?  ?  ?Future Appointments   ?        ? In 3 weeks Glean Hess, MD [facility]  ?  ? ?  ?  ?  Passed - CBC within normal limits and completed in the last 12 months  ?  WBC  ?Date Value Ref Range Status  ?07/25/2020 7.3 3.4 - 10.8 x10E3/uL Final  ? ?RBC  ?Date Value Ref Range Status  ?07/25/2020 5.19 3.77 - 5.28 x10E6/uL Final  ? ?Hemoglobin  ?Date Value Ref Range Status  ?07/25/2020 14.8 11.1 - 15.9 g/dL Final  ? ?Hematocrit  ?Date Value Ref Range Status  ?07/25/2020 45.1 34.0 - 46.6 % Final  ? ?MCHC  ?Date Value Ref Range Status  ?07/25/2020 32.8 31.5 - 35.7 g/dL Final  ? ?MCH  ?  Date Value Ref Range Status  ?07/25/2020 28.5 26.6 - 33.0 pg Final  ? ?MCV  ?Date Value Ref Range Status  ?07/25/2020 87 79 - 97 fL Final  ? ?No results found for: PLTCOUNTKUC, LABPLAT, [place] ?RDW  ?Date Value Ref Range Status  ?07/25/2020 13.7 11.7 - 15.4 % Final  ? ?  ?  ?  ? ? ?

## 2021-07-04 NOTE — Telephone Encounter (Signed)
Requested Prescriptions  ?Pending Prescriptions Disp Refills  ?? metFORMIN (GLUCOPHAGE-XR) 500 MG 24 hr tablet [Pharmacy Med Name: metFORMIN HCl ER Oral Tablet Extended Release 24 Hour 500 MG] 180 tablet 0  ?  Sig: TAKE ONE TABLET BY MOUTH TWICE DAILY  ?  ? Endocrinology:  Diabetes - Biguanides Failed - 07/02/2021  3:58 PM  ?  ?  Failed - B12 Level in normal range and within 720 days  ?  No results found for: VITAMINB12   ?  ?  Passed - Cr in normal range and within 360 days  ?  Creatinine, Ser  ?Date Value Ref Range Status  ?04/03/2021 0.94 0.57 - 1.00 mg/dL Final  ?   ?  ?  Passed - HBA1C is between 0 and 7.9 and within 180 days  ?  Hgb A1c MFr Bld  ?Date Value Ref Range Status  ?04/03/2021 6.7 (H) 4.8 - 5.6 % Final  ?  Comment:  ?           Prediabetes: 5.7 - 6.4 ?         Diabetes: >6.4 ?         Glycemic control for adults with diabetes: <7.0 ?  ?   ?  ?  Passed - eGFR in normal range and within 360 days  ?  GFR calc Af Amer  ?Date Value Ref Range Status  ?11/16/2019 72 >59 mL/min/1.73 Final  ?  Comment:  ?  **[facility] currently reports eGFR in compliance with the current** ?  recommendations of the [employer]. [facility] will ?  update reporting as new guidelines are published from the NKF-ASN ?  Task force. ?  ? ?GFR calc non Af Amer  ?Date Value Ref Range Status  ?11/16/2019 62 >59 mL/min/1.73 Final  ? ?eGFR  ?Date Value Ref Range Status  ?04/03/2021 62 >59 mL/min/1.73 Final  ?   ?  ?  Passed - Valid encounter within last 6 months  ?  Recent Outpatient Visits   ?      ? 2 weeks ago Acute non-recurrent maxillary sinusitis  ? [facility] Glean Hess, MD  ? 3 months ago Essential (primary) hypertension  ? [facility] Glean Hess, MD  ? 6 months ago Acute cough  ? [facility] Glean Hess, [facility]  ? 7 months ago DM type 2, controlled, with complication [facility])  ? [facility] Glean [facility], [facility]  ? 9 months ago Acute non-recurrent frontal sinusitis   ? [facility] Glean Hess, MD  ?  ?  ?Future Appointments   ?        ? In 3 weeks Glean Hess, MD [facility]  ?  ? ?  ?  ?  Passed - CBC within normal limits and completed in the last 12 months  ?  WBC  ?Date Value Ref Range Status  ?07/25/2020 7.3 3.4 - 10.8 x10E3/uL Final  ? ?RBC  ?Date Value Ref Range Status  ?07/25/2020 5.19 3.77 - 5.28 x10E6/uL Final  ? ?Hemoglobin  ?Date Value Ref Range Status  ?07/25/2020 14.8 11.1 - 15.9 g/dL Final  ? ?Hematocrit  ?Date Value Ref Range Status  ?07/25/2020 45.1 34.0 - 46.6 % Final  ? ?MCHC  ?Date Value Ref Range Status  ?07/25/2020 32.8 31.5 - 35.7 g/dL Final  ? ?MCH  ?Date Value Ref Range Status  ?07/25/2020 28.5 26.6 - 33.0 pg Final  ? ?MCV  ?  Date Value Ref Range Status  ?07/25/2020 87 79 - 97 fL Final  ? ?No results found for: PLTCOUNTKUC, LABPLAT, [place] ?RDW  ?Date Value Ref Range Status  ?07/25/2020 13.7 11.7 - 15.4 % Final  ? ?  ?  ?  ? ?

## 2021-07-25 ENCOUNTER — Ambulatory Visit (INDEPENDENT_AMBULATORY_CARE_PROVIDER_SITE_OTHER): Payer: Medicare Other

## 2021-07-25 DIAGNOSIS — Z0000 Encounter for general adult medical examination without abnormal findings: Secondary | ICD-10-CM

## 2021-07-25 NOTE — Progress Notes (Signed)
Subjective:   Kimberly Walls is a 81 y.o. female who presents for Medicare Annual (Subsequent) preventive examination.  Virtual Visit via Telephone Note  I connected with  Kimberly Walls on 07/25/21 at  1:00 PM EDT by telephone and verified that I am speaking with the correct person using two identifiers.  Location: Patient: home Provider: [facility] Persons participating in the virtual visit: [name]   I discussed the limitations, risks, security and privacy concerns of performing an evaluation and management service by telephone and the availability of in person appointments. The patient expressed understanding and agreed to proceed.  Interactive audio and video telecommunications were attempted between this nurse and patient, however failed, due to patient having technical difficulties OR patient did not have access to video capability.  We continued and completed visit with audio only.  Some vital signs may be absent or patient reported.   Clemetine Marker, LPN   Review of Systems     Cardiac Risk Factors include: advanced age (>32mn, >>12women);diabetes mellitus;dyslipidemia;hypertension;obesity (BMI >30kg/m2)     Objective:    There were no vitals filed for this visit. There is no height or weight on file to calculate BMI.     07/25/2021    1:14 PM 07/24/2020    2:50 PM 07/14/2019    3:36 PM 07/07/2017   10:43 AM 06/29/2015   10:45 AM 03/06/2015   10:34 AM  Advanced Directives  Does Patient Have a Medical Advance Directive? Yes No No No No No  Type of Advance Directive [facility]      Does patient want to make changes to medical advance directive?  No - Patient declined      Copy of [place] Chart? No - copy requested       Would patient like information on creating a medical advance directive?   No - Patient declined Yes (MAU/Ambulatory/Procedural Areas - Information given) No - patient declined information No - patient  declined information    Current Medications (verified) Outpatient Encounter Medications as of 07/25/2021  Medication Sig   amLODipine (NORVASC) 5 MG tablet Take 1 tablet (5 mg total) by mouth daily.   ascorbic acid (VITAMIN C) 250 MG tablet Take 250 mg by mouth daily.   aspirin 81 MG tablet Take 1 tablet by mouth daily.   atorvastatin (LIPITOR) 10 MG tablet TAKE ONE TABLET BY MOUTH ONCE DAILY   Biotin 2500 MCG CAPS Take 5,000 mcg by mouth daily.   Calcium Carb-Cholecalciferol (CALCIUM + D3) 600-200 MG-UNIT TABS Take 1 tablet by mouth daily.   cetirizine (ZYRTEC) 5 MG tablet Take 5 mg by mouth daily.   Cinnamon 500 MG TABS Take 2 tablets by mouth daily.   Coenzyme Q10 (COQ10) 200 MG CAPS Take 1 capsule by mouth daily.   escitalopram (LEXAPRO) 10 MG tablet TAKE ONE TABLET BY MOUTH ONCE DAILY   glucose blood test strip Use to test BS twice a day   hydrochlorothiazide (HYDRODIURIL) 25 MG tablet TAKE ONE TABLET BY MOUTH ONCE DAILY   JARDIANCE 10 MG TABS tablet Take 10 mg by mouth daily.   lisinopril (ZESTRIL) 40 MG tablet Take 1 tablet (40 mg total) by mouth daily.   metFORMIN (GLUCOPHAGE-XR) 500 MG 24 hr tablet TAKE ONE TABLET BY MOUTH TWICE DAILY   Multiple Vitamin (MULTIVITAMIN) capsule Take 1 capsule by mouth daily.   nystatin cream (MYCOSTATIN) Apply topically 2 (two) times daily. For rash under breast  and abdomen   omeprazole (PRILOSEC) 20 MG capsule TAKE ONE CAPSULE BY MOUTH ONCE DAILY   oxaprozin (DAYPRO) 600 MG tablet TAKE ONE TABLET BY MOUTH TWICE DAILY   zinc gluconate 50 MG tablet Take 50 mg by mouth daily.   diazepam (VALIUM) 2 MG tablet Take 1 tablet (2 mg total) by mouth every 8 (eight) hours as needed for anxiety. (Patient not taking: Reported on 07/25/2021)   diclofenac Sodium (VOLTAREN) 1 % GEL Apply topically 4 (four) times daily. (Patient not taking: Reported on 07/25/2021)   [DISCONTINUED] doxycycline (VIBRAMYCIN) 100 MG capsule Take 100 mg by mouth 2 (two) times daily.    [DISCONTINUED] erythromycin ophthalmic ointment Place 1 application into the right eye 3 (three) times daily.   No facility-administered encounter medications on file as of 07/25/2021.    Allergies (verified) Prochlorperazine, Pneumococcal vaccine, and Tetanus toxoids   History: Past Medical History:  Diagnosis Date   Allergy    Anxiety    Depression    Diabetes mellitus without complication (HCC)    GERD (gastroesophageal reflux disease)    Hyperlipidemia    Hypertension    Sleep apnea    does not use CPAP   Past Surgical History:  Procedure Laterality Date   BUNIONECTOMY Bilateral    CARPAL TUNNEL RELEASE Left    LUMBAR LAMINECTOMY     MASTECTOMY MODIFIED RADICAL Bilateral 1993   PARTIAL HYSTERECTOMY     TOTAL KNEE ARTHROPLASTY Bilateral    TOTAL SHOULDER REPLACEMENT Bilateral    Family History  Problem Relation Age of Onset   Diabetes Mother    Hypertension Mother    CAD Father    Diabetes Father    Diabetes Sister    Hypertension Sister    Heart disease Brother    Healthy Sister    Social History   Socioeconomic History   Marital status: Divorced    Spouse name: Not on file   Number of children: 1   Years of education: 12+   Highest education level: Some college, no degree  Occupational History   Occupation: Retired    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    Social Determinants of Health   Financial Resource Strain: Low Risk    Difficulty of Paying Living Expenses: Not hard at all  Food Insecurity: No Food Insecurity   Worried About Charity fundraiser in the Last Year: Never true   [place] in the Last  Year: Never true  Transportation Needs: No Transportation Needs   Lack of Transportation (Medical): No   Lack of Transportation (Non-Medical): No  Physical Activity: Inactive   Days of Exercise per Week: 0 days   Minutes of Exercise per Session: 0 min  Stress: No Stress Concern Present   Feeling of Stress : Only a little  Social Connections: Socially Isolated   Frequency of Communication with Friends and Family: More than three times a week   Frequency of Social Gatherings with Friends and Family: Three times a week   Attends Religious Services: Never   Active Member of  Clubs or Organizations: No   Attends Archivist Meetings: Never   Marital Status: Divorced    Tobacco Counseling Counseling given: Not Answered   Clinical Intake:  Pre-visit preparation completed: Yes  Pain : No/denies pain     Nutritional Risks: None Diabetes: Yes CBG done?: No Did pt. bring in CBG monitor from home?: No  How often do you need to have someone help you when you read instructions, pamphlets, or other written materials from your doctor or pharmacy?: 1 - Never  Nutrition Risk Assessment:  Has the patient had any N/V/D within the last 2 months?  No  Does the patient have any non-healing wounds?  No  Has the patient had any unintentional weight loss or weight gain?  No   Diabetes:  Is the patient diabetic?  Yes  If diabetic, was a CBG obtained today?  No  Did the patient bring in their glucometer from home?  No  How often do you monitor your CBG's? Twice weekly.   Financial Strains and Diabetes Management:  Are you having any financial strains with the device, your supplies or your medication? No .  Does the patient want to be seen by Chronic Care Management for management of their diabetes?  No  Would the patient like to be referred to a Nutritionist or for Diabetic Management?  No   Diabetic Exams:  Diabetic Eye Exam: Completed 03/28/21. Overdue for diabetic eye exam. Pt  has been advised about the importance in completing this exam. A referral has been placed today. Message sent to referral coordinator for scheduling purposes. Advised pt to expect a call from office referred to regarding appt.  Diabetic Foot Exam: Completed 12/01/20.   Interpreter Needed?: No  Information entered by :: Clemetine Marker LPN   Activities of Daily Living    07/25/2021    1:14 PM 06/19/2021    1:32 PM  In your present state of health, do you have any difficulty performing the following activities:  Hearing? 1 1  Vision? 0 0  Difficulty concentrating or making decisions? 0 0  Walking or climbing stairs? 1 1  Dressing or bathing? 0 0  Doing errands, shopping? 0 0  Preparing Food and eating ? N   Using the Toilet? N   In the past six months, have you accidently leaked urine? N   Do you have problems with loss of bowel control? N   Managing your Medications? N   Managing your Finances? N   Housekeeping or managing your Housekeeping? N     Patient Care Team: Glean Hess, MD as PCP - General (Internal Medicine) Nelda Bucks, MD as Referring Physician (Ophthalmology)  Indicate any recent Medical Services you may have received from other than [facility] providers in the past year (date may be approximate).     Assessment:   This is a routine wellness examination for [place].  Hearing/Vision screen Hearing Screening - Comments::  Pt denies hearing difficulty Vision Screening - Comments:: Annual vision screenings done at [facility]. Jill Side  Dietary issues and exercise activities discussed: Current Exercise Habits: The patient has a physically strenuous job, but has no regular exercise apart from work., Exercise limited by: None identified   Goals Addressed   None    Depression Screen    07/25/2021    1:12 PM 06/19/2021    1:31 PM 04/03/2021   10:28 AM 12/01/2020   10:14 AM 09/12/2020   11:48 AM 07/25/2020   10:44 AM  07/24/2020    2:49 PM  PHQ 2/9 Scores  PHQ - 2 Score  1 2 0 '1 5 2 '$ 0  PHQ- 9 Score '2 3 2 3 12 8     '$ Fall Risk    07/25/2021    1:14 PM 06/19/2021    1:32 PM 04/03/2021   10:28 AM 12/01/2020   10:15 AM 09/19/2020   11:38 AM  Fall Risk   Falls in the past year? 0 0 0 0 0  Number falls in past yr: 0 0 0 0 0  Injury with Fall? 0 0 0 0 0  Risk for fall due to : No Fall Risks No Fall Risks No Fall Risks No Fall Risks No Fall Risks  Follow up Falls prevention discussed Falls evaluation completed Falls evaluation completed Falls evaluation completed Falls evaluation completed    Draper:  Any stairs in or around the home? Yes  If so, are there any without handrails? No  Home free of loose throw rugs in walkways, pet beds, electrical cords, etc? Yes  Adequate lighting in your home to reduce risk of falls? Yes   ASSISTIVE DEVICES UTILIZED TO PREVENT FALLS:  Life alert? No  Use of a cane, walker or w/c? No  Grab bars in the bathroom? No  Shower chair or bench in shower? Yes  Elevated toilet seat or a handicapped toilet? No   TIMED UP AND GO:  Was the test performed? No . Telephonic visit.   Cognitive Function: Normal cognitive status assessed by direct observation by this Nurse Health Advisor. No abnormalities found.          07/14/2019    3:44 PM 07/08/2018   10:27 AM 07/07/2017   10:51 AM 07/01/2016    8:25 AM  6CIT Screen  What Year? 0 points 0 points 0 points 0 points  What month? 0 points 0 points 0 points 0 points  What time? 0 points 0 points 0 points 0 points  Count back from 20 0 points 0 points 0 points 0 points  Months in reverse 0 points 0 points 0 points 0 points  Repeat phrase 0 points 0 points 0 points 0 points  Total Score 0 points 0 points 0 points 0 points    Immunizations Immunization History  Administered Date(s) Administered   Fluad Quad(high Dose 65+) 11/20/2018, 11/16/2019, 12/01/2020   Hepatitis A, Adult 09/21/2018, 05/05/2019   Influenza Split 02/02/2004, 01/02/2009    Influenza, High Dose Seasonal PF 11/19/2016, 11/17/2017   Influenza,inj,Quad PF,6+ Mos 01/01/2016   Influenza,inj,quad, With Preservative 11/19/2018   Influenza-Unspecified 11/25/2014   PFIZER(Purple Top)SARS-COV-2 Vaccination 03/24/2019, 04/14/2019, 03/28/2020   Pneumococcal Conjugate-13 06/27/2014   Pneumococcal Polysaccharide-23 07/20/2018   Tdap 07/01/2016    TDAP status: Up to date  Flu Vaccine status: Up to date  Pneumococcal vaccine status: Up to date  Covid-19 vaccine status: Completed vaccines  Qualifies for Shingles Vaccine? Yes   Zostavax completed No   Shingrix Completed?: No.    Education has been provided regarding the importance of this vaccine. Patient has been advised to call insurance company to determine out of pocket expense if they have not yet received this vaccine. Advised may also receive vaccine at local pharmacy or Health Dept. Verbalized acceptance and understanding.  Screening Tests Health Maintenance  Topic Date Due   Zoster Vaccines- Shingrix (1 of 2) Never done   COVID-19 Vaccine (4 - Booster for [employer] series) 05/23/2020   INFLUENZA VACCINE  09/18/2021   HEMOGLOBIN A1C  10/01/2021   FOOT EXAM  12/01/2021   OPHTHALMOLOGY EXAM  03/28/2022   TETANUS/TDAP  07/02/2026   Pneumonia Vaccine 36+ Years old  Completed   DEXA SCAN  Completed   Hepatitis C Screening  Completed   HPV VACCINES  Aged Out    Health Maintenance  Health Maintenance Due  Topic Date Due   Zoster Vaccines- Shingrix (1 of 2) Never done   COVID-19 Vaccine (4 - Booster for [employer] series) 05/23/2020    Colorectal cancer screening: No longer required.   Mammogram status: No longer required due to bilateral mastectomy.  Bone Density status: Completed 07/23/18. Results reflect: Bone density results: NORMAL. Repeat every as directed years.  Lung Cancer Screening: (Low Dose CT Chest recommended if Age 59-80 years, 30 pack-year currently smoking OR have quit w/in 15years.) does not  qualify.   Additional Screening:  Hepatitis C Screening: does qualify; Completed 03/02/20  Vision Screening: Recommended annual ophthalmology exams for early detection of glaucoma and other disorders of the eye. Is the patient up to date with their annual eye exam?  Yes  Who is the provider or what is the name of the office in which the patient attends annual eye exams? [facility].   Dental Screening: Recommended annual dental exams for proper oral hygiene  Community Resource Referral / Chronic Care Management: CRR required this visit?  No   CCM required this visit?  No      Plan:     I have personally reviewed and noted the following in the patient's chart:   Medical and social history Use of alcohol, tobacco or illicit drugs  Current medications and supplements including opioid prescriptions.  Functional ability and status Nutritional status Physical activity Advanced directives List of other physicians Hospitalizations, surgeries, and ER visits in previous 12 months Vitals Screenings to include cognitive, depression, and falls Referrals and appointments  In addition, I have reviewed and discussed with patient certain preventive protocols, quality metrics, and best practice recommendations. A written personalized care plan for preventive services as well as general preventive health recommendations were provided to patient.     Clemetine Marker, LPN   [birth year]   Nurse Notes: none

## 2021-07-25 NOTE — Patient Instructions (Signed)
Kimberly Walls , Thank you for taking time to come for your Medicare Wellness Visit. I appreciate your ongoing commitment to your health goals. Please review the following plan we discussed and let me know if I can assist you in the future.   Screening recommendations/referrals: Colonoscopy: no longer required Bone Density: done 07/23/18 Recommended yearly ophthalmology/optometry visit for glaucoma screening and checkup Recommended yearly dental visit for hygiene and checkup  Vaccinations: Influenza vaccine: done 12/01/20 Pneumococcal vaccine: done 07/20/18 Tdap vaccine: done 07/01/16 Shingles vaccine: Shingrix discussed. Please contact your pharmacy for coverage information.  Covid-19:done 03/24/19, 04/14/19 & 03/28/20  Advanced directives: Please bring a copy of your health care power of attorney and living will to the office at your convenience.   Conditions/risks identified: Recommend increasing physical activity as tolerated  Next appointment: Follow up in one year for your annual wellness visit    Preventive Care 65 Years and Older, Female Preventive care refers to lifestyle choices and visits with your health care provider that can promote health and wellness. What does preventive care include? A yearly physical exam. This is also called an annual well check. Dental exams once or twice a year. Routine eye exams. Ask your health care provider how often you should have your eyes checked. Personal lifestyle choices, including: Daily care of your teeth and gums. Regular physical activity. Eating a healthy diet. Avoiding tobacco and drug use. Limiting alcohol use. Practicing safe sex. Taking low-dose aspirin every day. Taking vitamin and mineral supplements as recommended by your health care provider. What happens during an annual well check? The services and screenings done by your health care provider during your annual well check will depend on your age, overall health, lifestyle risk  factors, and family history of disease. Counseling  Your health care provider may ask you questions about your: Alcohol use. Tobacco use. Drug use. Emotional well-being. Home and relationship well-being. Sexual activity. Eating habits. History of falls. Memory and ability to understand (cognition). Work and work Statistician. Reproductive health. Screening  You may have the following tests or measurements: Height, weight, and BMI. Blood pressure. Lipid and cholesterol levels. These may be checked every 5 years, or more frequently if you are over 75 years old. Skin check. Lung cancer screening. You may have this screening every year starting at age 67 if you have a 30-pack-year history of smoking and currently smoke or have quit within the past 15 years. Fecal occult blood test (FOBT) of the stool. You may have this test every year starting at age 83. Flexible sigmoidoscopy or colonoscopy. You may have a sigmoidoscopy every 5 years or a colonoscopy every 10 years starting at age 66. Hepatitis C blood test. Hepatitis B blood test. Sexually transmitted disease (STD) testing. Diabetes screening. This is done by checking your blood sugar (glucose) after you have not eaten for a while (fasting). You may have this done every 1-3 years. Bone density scan. This is done to screen for osteoporosis. You may have this done starting at age 36. Mammogram. This may be done every 1-2 years. Talk to your health care provider about how often you should have regular mammograms. Talk with your health care provider about your test results, treatment options, and if necessary, the need for more tests. Vaccines  Your health care provider may recommend certain vaccines, such as: Influenza vaccine. This is recommended every year. Tetanus, diphtheria, and acellular pertussis (Tdap, Td) vaccine. You may need a Td booster every 10 years. Zoster vaccine. You may  need this after age 2. Pneumococcal 13-valent  conjugate (PCV13) vaccine. One dose is recommended after age 48. Pneumococcal polysaccharide (PPSV23) vaccine. One dose is recommended after age 55. Talk to your health care provider about which screenings and vaccines you need and how often you need them. This information is not intended to replace advice given to you by your health care provider. Make sure you discuss any questions you have with your health care provider. Document Released: 03/03/2015 Document Revised: 10/25/2015 Document Reviewed: 12/06/2014 Elsevier Interactive Patient Education  2017 [place] Prevention in the Home Falls can cause injuries. They can happen to people of all ages. There are many things you can do to make your home safe and to help prevent falls. What can I do on the outside of my home? Regularly fix the edges of walkways and driveways and fix any cracks. Remove anything that might make you trip as you walk through a door, such as a raised step or threshold. Trim any bushes or trees on the path to your home. Use bright outdoor lighting. Clear any walking paths of anything that might make someone trip, such as rocks or tools. Regularly check to see if handrails are loose or broken. Make sure that both sides of any steps have handrails. Any raised decks and porches should have guardrails on the edges. Have any leaves, snow, or ice cleared regularly. Use sand or salt on walking paths during winter. Clean up any spills in your garage right away. This includes oil or grease spills. What can I do in the bathroom? Use night lights. Install grab bars by the toilet and in the tub and shower. Do not use towel bars as grab bars. Use non-skid mats or decals in the tub or shower. If you need to sit down in the shower, use a plastic, non-slip stool. Keep the floor dry. Clean up any water that spills on the floor as soon as it happens. Remove soap buildup in the tub or shower regularly. Attach bath mats  securely with double-sided non-slip rug tape. Do not have throw rugs and other things on the floor that can make you trip. What can I do in the bedroom? Use night lights. Make sure that you have a light by your bed that is easy to reach. Do not use any sheets or blankets that are too big for your bed. They should not hang down onto the floor. Have a firm chair that has side arms. You can use this for support while you get dressed. Do not have throw rugs and other things on the floor that can make you trip. What can I do in the kitchen? Clean up any spills right away. Avoid walking on wet floors. Keep items that you use a lot in easy-to-reach places. If you need to reach something above you, use a strong step stool that has a grab bar. Keep electrical cords out of the way. Do not use floor polish or wax that makes floors slippery. If you must use wax, use non-skid floor wax. Do not have throw rugs and other things on the floor that can make you trip. What can I do with my stairs? Do not leave any items on the stairs. Make sure that there are handrails on both sides of the stairs and use them. Fix handrails that are broken or loose. Make sure that handrails are as long as the stairways. Check any carpeting to make sure that it is firmly attached  to the stairs. Fix any carpet that is loose or worn. Avoid having throw rugs at the top or bottom of the stairs. If you do have throw rugs, attach them to the floor with carpet tape. Make sure that you have a light switch at the top of the stairs and the bottom of the stairs. If you do not have them, ask someone to add them for you. What else can I do to help prevent falls? Wear shoes that: Do not have high heels. Have rubber bottoms. Are comfortable and fit you well. Are closed at the toe. Do not wear sandals. If you use a stepladder: Make sure that it is fully opened. Do not climb a closed stepladder. Make sure that both sides of the stepladder  are locked into place. Ask someone to hold it for you, if possible. Clearly mark and make sure that you can see: Any grab bars or handrails. First and last steps. Where the edge of each step is. Use tools that help you move around (mobility aids) if they are needed. These include: Canes. Walkers. Scooters. Crutches. Turn on the lights when you go into a dark area. Replace any light bulbs as soon as they burn out. Set up your furniture so you have a clear path. Avoid moving your furniture around. If any of your floors are uneven, fix them. If there are any pets around you, be aware of where they are. Review your medicines with your doctor. Some medicines can make you feel dizzy. This can increase your chance of falling. Ask your doctor what other things that you can do to help prevent falls. This information is not intended to replace advice given to you by your health care provider. Make sure you discuss any questions you have with your health care provider. Document Released: 12/01/2008 Document Revised: 07/13/2015 Document Reviewed: 03/11/2014 Elsevier Interactive Patient Education  2017 [employer].

## 2021-07-27 ENCOUNTER — Other Ambulatory Visit: Payer: Self-pay | Admitting: Internal Medicine

## 2021-07-27 ENCOUNTER — Encounter: Payer: Self-pay | Admitting: Internal Medicine

## 2021-07-27 ENCOUNTER — Ambulatory Visit (INDEPENDENT_AMBULATORY_CARE_PROVIDER_SITE_OTHER): Payer: Medicare Other | Admitting: Internal Medicine

## 2021-07-27 VITALS — BP 136/82 | HR 77 | Ht 63.0 in | Wt 206.2 lb

## 2021-07-27 DIAGNOSIS — E1169 Type 2 diabetes mellitus with other specified complication: Secondary | ICD-10-CM | POA: Diagnosis not present

## 2021-07-27 DIAGNOSIS — E785 Hyperlipidemia, unspecified: Secondary | ICD-10-CM

## 2021-07-27 DIAGNOSIS — G4733 Obstructive sleep apnea (adult) (pediatric): Secondary | ICD-10-CM | POA: Diagnosis not present

## 2021-07-27 DIAGNOSIS — I10 Essential (primary) hypertension: Secondary | ICD-10-CM | POA: Diagnosis not present

## 2021-07-27 DIAGNOSIS — E118 Type 2 diabetes mellitus with unspecified complications: Secondary | ICD-10-CM | POA: Diagnosis not present

## 2021-07-27 DIAGNOSIS — R109 Unspecified abdominal pain: Secondary | ICD-10-CM

## 2021-07-27 DIAGNOSIS — M2550 Pain in unspecified joint: Secondary | ICD-10-CM | POA: Insufficient documentation

## 2021-07-27 DIAGNOSIS — F3341 Major depressive disorder, recurrent, in partial remission: Secondary | ICD-10-CM

## 2021-07-27 DIAGNOSIS — R8271 Bacteriuria: Secondary | ICD-10-CM | POA: Diagnosis not present

## 2021-07-27 LAB — POCT URINALYSIS DIPSTICK
Bilirubin, UA: NEGATIVE
Blood, UA: NEGATIVE
Glucose, UA: NEGATIVE
Ketones, UA: NEGATIVE
Nitrite, UA: POSITIVE
Protein, UA: NEGATIVE
Spec Grav, UA: 1.015 (ref 1.010–1.025)
Urobilinogen, UA: 0.2 E.U./dL
pH, UA: 5 (ref 5.0–8.0)

## 2021-07-27 MED ORDER — LISINOPRIL 40 MG PO TABS: 40.0000 mg | ORAL_TABLET | Freq: Every day | ORAL | 3 refills | Status: AC

## 2021-07-27 MED ORDER — OXAPROZIN 600 MG PO TABS: 600.0000 mg | ORAL_TABLET | Freq: Two times a day (BID) | ORAL | 1 refills | Status: DC

## 2021-07-27 MED ORDER — HYDROCHLOROTHIAZIDE 25 MG PO TABS: 25.0000 mg | ORAL_TABLET | Freq: Every day | ORAL | 1 refills | Status: DC

## 2021-07-27 MED ORDER — ATORVASTATIN CALCIUM 10 MG PO TABS: 10.0000 mg | ORAL_TABLET | Freq: Every day | ORAL | 1 refills | Status: DC

## 2021-07-27 NOTE — Progress Notes (Signed)
Date:  07/27/2021   Name:  Kimberly Walls   DOB:  September 15, 1940   MRN:  [id number]   Chief Complaint: Annual Exam Kimberly Walls is a 81 y.o. female who presents today for her Complete Annual Exam. She feels well. She reports exercising- some. She reports she is sleeping poorly. Breast complaints - none.  Mammogram: discontinued DEXA: 07/2018 normal Pap smear: discontinued Colonoscopy: 2015  Health Maintenance Due  Topic Date Due   Zoster Vaccines- Shingrix (1 of 2) Never done    Immunization History  Administered Date(s) Administered   Fluad Quad(high Dose 65+) 11/20/2018, 11/16/2019, 12/01/2020   Hepatitis A, Adult 09/21/2018, 05/05/2019   Influenza Split 02/02/2004, 01/02/2009   Influenza, High Dose Seasonal PF 11/19/2016, 11/17/2017   Influenza,inj,Quad PF,6+ Mos 01/01/2016   Influenza,inj,quad, With Preservative 11/19/2018   Influenza-Unspecified 11/25/2014   PFIZER(Purple Top)SARS-COV-2 Vaccination 03/24/2019, 04/14/2019, 03/28/2020   Pneumococcal Conjugate-13 06/27/2014   Pneumococcal Polysaccharide-23 07/20/2018   Tdap 07/01/2016    Hypertension This is a chronic problem. The problem is controlled. Pertinent negatives include no chest pain, headaches, palpitations or shortness of breath. Past treatments include ACE inhibitors, diuretics and calcium channel blockers. Hypertensive end-organ damage includes CAD/MI. There is no history of kidney disease or CVA.  Diabetes She presents for her follow-up diabetic visit. She has type 2 diabetes mellitus. Her disease course has been stable. Pertinent negatives for hypoglycemia include no dizziness, headaches, nervousness/anxiousness or tremors. Pertinent negatives for diabetes include no chest pain, no fatigue, no polydipsia and no polyuria. Pertinent negatives for diabetic complications include no CVA. Current diabetic treatment includes oral agent (dual therapy) (jardiance and metformine).  Hyperlipidemia This is a chronic problem.  Pertinent negatives include no chest pain or shortness of breath. Current antihyperlipidemic treatment includes statins. The current treatment provides significant improvement of lipids.  Depression        This is a chronic problem.The problem is unchanged (more stress trying to sell her brother's property).  Associated symptoms include no fatigue and no headaches.  Past treatments include SSRIs - Selective serotonin reuptake inhibitors.   Lab Results  Component Value Date   NA 145 (H) 04/03/2021   K 4.6 04/03/2021   CO2 23 04/03/2021   GLUCOSE 136 (H) 04/03/2021   BUN 30 (H) 04/03/2021   CREATININE 0.94 04/03/2021   CALCIUM 9.5 04/03/2021   EGFR 62 04/03/2021   GFRNONAA 62 11/16/2019   Lab Results  Component Value Date   CHOL 146 07/25/2020   HDL 39 (L) 07/25/2020   LDLCALC 65 07/25/2020   TRIG 265 (H) 07/25/2020   CHOLHDL 3.7 07/25/2020   Lab Results  Component Value Date   TSH 2.040 07/25/2020   Lab Results  Component Value Date   HGBA1C 6.7 (H) 04/03/2021   Lab Results  Component Value Date   WBC 7.3 07/25/2020   HGB 14.8 07/25/2020   HCT 45.1 07/25/2020   MCV 87 07/25/2020   PLT 257 07/25/2020   Lab Results  Component Value Date   ALT 23 07/25/2020   AST 32 07/25/2020   ALKPHOS 106 07/25/2020   BILITOT 0.5 07/25/2020   No results found for: "25OHVITD2", "25OHVITD3", "VD25OH"   Review of Systems  Constitutional:  Negative for chills, fatigue and fever.  HENT:  Negative for congestion, hearing loss, tinnitus, trouble swallowing and voice change.   Eyes:  Positive for visual disturbance (blurred vision, tearing, dry eyes).  Respiratory:  Negative for cough, chest tightness, shortness of breath and wheezing.  Cardiovascular:  Negative for chest pain, palpitations and leg swelling.  Gastrointestinal:  Negative for abdominal pain, constipation, diarrhea and vomiting.  Endocrine: Negative for polydipsia and polyuria.  Genitourinary:  Negative for dysuria,  frequency, genital sores, vaginal bleeding and vaginal discharge.  Musculoskeletal:  Positive for arthralgias and back pain. Negative for gait problem and joint swelling.  Skin:  Negative for color change and rash.  Neurological:  Negative for dizziness, tremors, light-headedness and headaches.  Hematological:  Negative for adenopathy. Does not bruise/bleed easily.  Psychiatric/Behavioral:  Positive for depression. Negative for dysphoric mood and sleep disturbance. The patient is not nervous/anxious.     Patient Active Problem List   Diagnosis Date Noted   Arthralgia 07/27/2021   Mild aortic stenosis 11/06/2020   Atypical chest pain 09/12/2020   BMI 38.0-38.9,adult 06/29/2015   History of pulmonary embolus (PE) 06/29/2015   DM type 2, controlled, with complication ([place]) 00/92/3300   Arthritis, degenerative 12/05/2014   DDD (degenerative disc disease), lumbar 08/29/2014   Hyperlipidemia associated with type 2 diabetes mellitus ([place]) 08/29/2014   Essential (primary) hypertension 08/29/2014   Acid reflux 08/29/2014   Herpes 08/29/2014   Obstructive sleep apnea of adult 08/29/2014   Depression, major, recurrent, in partial remission ([place]) 08/29/2014   Headache, tension-type 08/29/2014   Dermatophytosis of groin 08/29/2014   History of colon polyps 02/19/2008   Personal history of malignant neoplasm of breast 02/19/1991    Allergies  Allergen Reactions   Prochlorperazine    Pneumococcal Vaccine Rash   Tetanus Toxoids Rash    Past Surgical History:  Procedure Laterality Date   BUNIONECTOMY Bilateral    CARPAL TUNNEL RELEASE Left    LUMBAR LAMINECTOMY     MASTECTOMY MODIFIED RADICAL Bilateral 1993   PARTIAL HYSTERECTOMY     TOTAL KNEE ARTHROPLASTY Bilateral    TOTAL SHOULDER REPLACEMENT Bilateral     Social History   Tobacco Use   Smoking status: Never   Smokeless tobacco: Never  Vaping Use   Vaping Use: Never used  Substance Use Topics   Alcohol use: Not Currently    Drug use: No     Medication list has been reviewed and updated.  Current Meds  Medication Sig   amLODipine (NORVASC) 5 MG tablet Take 1 tablet (5 mg total) by mouth daily.   ascorbic acid (VITAMIN C) 250 MG tablet Take 250 mg by mouth daily.   aspirin 81 MG tablet Take 1 tablet by mouth daily.   Biotin 2500 MCG CAPS Take 5,000 mcg by mouth daily.   Calcium Carb-Cholecalciferol (CALCIUM + D3) 600-200 MG-UNIT TABS Take 1 tablet by mouth daily.   cetirizine (ZYRTEC) 5 MG tablet Take 5 mg by mouth daily.   Cinnamon 500 MG TABS Take 2 tablets by mouth daily.   Coenzyme Q10 (COQ10) 200 MG CAPS Take 1 capsule by mouth daily.   cyanocobalamin 100 MCG tablet Take 100 mcg by mouth daily.   diazepam (VALIUM) 2 MG tablet Take 1 tablet (2 mg total) by mouth every 8 (eight) hours as needed for anxiety.   diclofenac Sodium (VOLTAREN) 1 % GEL Apply topically 4 (four) times daily.   Elderberry 500 MG CAPS Take by mouth.   escitalopram (LEXAPRO) 10 MG tablet TAKE ONE TABLET BY MOUTH ONCE DAILY   glucose blood test strip Use to test BS twice a day   JARDIANCE 10 MG TABS tablet Take 10 mg by mouth daily.   metFORMIN (GLUCOPHAGE-XR) 500 MG 24 hr tablet TAKE ONE TABLET  BY MOUTH TWICE DAILY   Multiple Vitamin (MULTIVITAMIN) capsule Take 1 capsule by mouth daily.   nystatin cream (MYCOSTATIN) Apply topically 2 (two) times daily. For rash under breast and abdomen   omeprazole (PRILOSEC) 20 MG capsule TAKE ONE CAPSULE BY MOUTH ONCE DAILY   zinc gluconate 50 MG tablet Take 50 mg by mouth daily.   [DISCONTINUED] atorvastatin (LIPITOR) 10 MG tablet TAKE ONE TABLET BY MOUTH ONCE DAILY   [DISCONTINUED] hydrochlorothiazide (HYDRODIURIL) 25 MG tablet TAKE ONE TABLET BY MOUTH ONCE DAILY   [DISCONTINUED] lisinopril (ZESTRIL) 40 MG tablet Take 1 tablet (40 mg total) by mouth daily.   [DISCONTINUED] oxaprozin (DAYPRO) 600 MG tablet TAKE ONE TABLET BY MOUTH TWICE DAILY       07/27/2021   10:05 AM 06/19/2021    1:32 PM  04/03/2021   10:28 AM 12/01/2020   10:14 AM  GAD 7 : Generalized Anxiety Score  Nervous, Anxious, on Edge 1 1 1  0  Control/stop worrying 2 1 1 1   Worry too much - different things 1 1 1 1   Trouble relaxing 1 0 0 0  Restless 2 0 1 0  Easily annoyed or irritable 0 0 0 0  Afraid - awful might happen 0 0 0 0  Total GAD 7 Score 7 3 4 2   Anxiety Difficulty Somewhat difficult Not difficult at all Not difficult at all Not difficult at all       07/27/2021   10:05 AM  Depression screen PHQ 2/9  Decreased Interest 0  Down, Depressed, Hopeless 1  PHQ - 2 Score 1  Altered sleeping 3  Tired, decreased energy 1  Change in appetite 1  Feeling bad or failure about yourself  0  Trouble concentrating 0  Moving slowly or fidgety/restless 1  Suicidal thoughts 0  PHQ-9 Score 7  Difficult doing work/chores Somewhat difficult    BP Readings from Last 3 Encounters:  07/27/21 136/82  06/19/21 140/84  04/03/21 (!) 146/86    Physical Exam Vitals and nursing note reviewed.  Constitutional:      General: She is not in acute distress.    Appearance: She is well-developed.  HENT:     Head: Normocephalic and atraumatic.     Right Ear: Tympanic membrane and ear canal normal.     Left Ear: Tympanic membrane and ear canal normal.     Nose:     Right Sinus: No maxillary sinus tenderness.     Left Sinus: No maxillary sinus tenderness.  Eyes:     General: No scleral icterus.       Right eye: No discharge.        Left eye: No discharge.     Conjunctiva/sclera: Conjunctivae normal.  Neck:     Thyroid: No thyromegaly.     Vascular: No carotid bruit.  Cardiovascular:     Rate and Rhythm: Normal rate and regular rhythm.     Pulses: Normal pulses.     Heart sounds: Normal heart sounds.  Pulmonary:     Effort: Pulmonary effort is normal. No respiratory distress.     Breath sounds: No wheezing.  Chest:  Breasts:    Right: No mass or skin change.     Left: No mass or skin change.     Comments:  Bilateral breast reconstruction -  Abdominal:     General: Bowel sounds are normal.     Palpations: Abdomen is soft.     Tenderness: There is no abdominal tenderness.  Musculoskeletal:  Cervical back: Normal range of motion. No erythema.     Right lower leg: No edema.     Left lower leg: No edema.  Lymphadenopathy:     Cervical: No cervical adenopathy.  Skin:    General: Skin is warm and dry.     Findings: No rash.  Neurological:     Mental Status: She is alert and oriented to person, place, and time.     Cranial Nerves: No cranial nerve deficit.     Sensory: No sensory deficit.     Deep Tendon Reflexes: Reflexes are normal and symmetric.  Psychiatric:        Attention and Perception: Attention normal.        Mood and Affect: Mood normal.     Wt Readings from Last 3 Encounters:  07/27/21 206 lb 3.2 oz (93.5 kg)  06/19/21 200 lb 6.4 oz (90.9 kg)  04/03/21 213 lb (96.6 kg)    BP 136/82 (BP Location: Right Arm, Cuff Size: Large)   Pulse 77   Ht $R'5\' 3"'kh$  (1.6 m)   Wt 206 lb 3.2 oz (93.5 kg)   SpO2 94%   BMI 36.53 kg/m   Assessment and Plan: 1. Essential (primary) hypertension Clinically stable exam with well controlled BP. Tolerating medications without side effects at this time. Pt to continue current regimen and low sodium diet; benefits of regular exercise as able discussed. - CBC with Differential/Platelet - POCT urinalysis dipstick - lisinopril (ZESTRIL) 40 MG tablet; Take 1 tablet (40 mg total) by mouth daily.  Dispense: 90 tablet; Refill: 3 - hydrochlorothiazide (HYDRODIURIL) 25 MG tablet; Take 1 tablet (25 mg total) by mouth daily.  Dispense: 90 tablet; Refill: 1  2. DM type 2, controlled, with complication ([place]) Clinically stable by exam and report without s/s of hypoglycemia. DM complicated by hypertension and dyslipidemia. Tolerating medications well without side effects or other concerns. - Comprehensive metabolic panel - Hemoglobin A1c  3.  Hyperlipidemia associated with type 2 diabetes mellitus ([place]) Tolerating statin medication without side effects at this time LDL is at goal of < 70 on current dose Continue same therapy without change at this time. - Lipid panel - atorvastatin (LIPITOR) 10 MG tablet; Take 1 tablet (10 mg total) by mouth daily.  Dispense: 90 tablet; Refill: 1  4. Obstructive sleep apnea of adult Not on CPAP  5. Depression, major, recurrent, in partial remission (H[facility]) Mild increase in symptoms despite Lexapro. Recent stress concerning selling her brother's property is the main stressor and should be resolved soon. - TSH  6. Arthralgia, unspecified joint Continue PRN nsaids. Check renal function for monitoring. - oxaprozin (DAYPRO) 600 MG tablet; Take 1 tablet (600 mg total) by mouth 2 (two) times daily.  Dispense: 180 tablet; Refill: 1  7. Bacteria in urine Mild discomfort in LLQ Will wait for culture to treat - POCT urinalysis dipstick - Urine Culture  8. Abdominal discomfort Likely due to UTI   Partially dictated using Editor, commissioning. Any errors are unintentional.  Halina Maidens, MD [facility]  07/27/2021   Partially dictated using Dragon software. Any errors are unintentional.  Halina Maidens, MD [facility]  07/27/2021

## 2021-07-28 LAB — COMPREHENSIVE METABOLIC PANEL
ALT: 17 IU/L (ref 0–32)
AST: 27 IU/L (ref 0–40)
Albumin/Globulin Ratio: 1.9 (ref 1.2–2.2)
Albumin: 4.3 g/dL (ref 3.7–4.7)
Alkaline Phosphatase: 113 IU/L (ref 44–121)
BUN/Creatinine Ratio: 27 (ref 12–28)
BUN: 25 mg/dL (ref 8–27)
Bilirubin Total: 0.5 mg/dL (ref 0.0–1.2)
CO2: 22 mmol/L (ref 20–29)
Calcium: 9.8 mg/dL (ref 8.7–10.3)
Chloride: 100 mmol/L (ref 96–106)
Creatinine, Ser: 0.94 mg/dL (ref 0.57–1.00)
Globulin, Total: 2.3 g/dL (ref 1.5–4.5)
Glucose: 132 mg/dL — ABNORMAL HIGH (ref 70–99)
Potassium: 4.4 mmol/L (ref 3.5–5.2)
Sodium: 139 mmol/L (ref 134–144)
Total Protein: 6.6 g/dL (ref 6.0–8.5)
eGFR: 62 mL/min/{1.73_m2} (ref >59)

## 2021-07-28 LAB — LIPID PANEL
Chol/HDL Ratio: 3.8 ratio (ref 0.0–4.4)
Cholesterol, Total: 165 mg/dL (ref 100–199)
HDL: 43 mg/dL (ref >39)
LDL Chol Calc (NIH): 83 mg/dL (ref 0–99)
Triglycerides: 232 mg/dL — ABNORMAL HIGH (ref 0–149)
VLDL Cholesterol Cal: 39 mg/dL (ref 5–40)

## 2021-07-28 LAB — CBC WITH DIFFERENTIAL/PLATELET
Basophils Absolute: 0.1 10*3/uL (ref 0.0–0.2)
Basos: 1 %
EOS (ABSOLUTE): 0.4 10*3/uL (ref 0.0–0.4)
Eos: 5 %
Hematocrit: 46.9 % — ABNORMAL HIGH (ref 34.0–46.6)
Hemoglobin: 15.6 g/dL (ref 11.1–15.9)
Immature Grans (Abs): 0 10*3/uL (ref 0.0–0.1)
Immature Granulocytes: 0 %
Lymphocytes Absolute: 2.4 10*3/uL (ref 0.7–3.1)
Lymphs: 32 %
MCH: 28.2 pg (ref 26.6–33.0)
MCHC: 33.3 g/dL (ref 31.5–35.7)
MCV: 85 fL (ref 79–97)
Monocytes Absolute: 0.8 10*3/uL (ref 0.1–0.9)
Monocytes: 11 %
Neutrophils Absolute: 3.9 10*3/uL (ref 1.4–7.0)
Neutrophils: 51 %
Platelets: 256 10*3/uL (ref 150–450)
RBC: 5.53 x10E6/uL — ABNORMAL HIGH (ref 3.77–5.28)
RDW: 15.3 % (ref 11.7–15.4)
WBC: 7.6 10*3/uL (ref 3.4–10.8)

## 2021-07-28 LAB — HEMOGLOBIN A1C
Est. average glucose Bld gHb Est-mCnc: 148 mg/dL
Hgb A1c MFr Bld: 6.8 % — ABNORMAL HIGH (ref 4.8–5.6)

## 2021-07-28 LAB — TSH: TSH: 4.31 u[IU]/mL (ref 0.450–4.500)

## 2021-07-31 LAB — URINE CULTURE

## 2021-08-01 ENCOUNTER — Other Ambulatory Visit: Payer: Self-pay | Admitting: Internal Medicine

## 2021-08-01 DIAGNOSIS — N3000 Acute cystitis without hematuria: Secondary | ICD-10-CM

## 2021-08-01 MED ORDER — SULFAMETHOXAZOLE-TRIMETHOPRIM 800-160 MG PO TABS: 1.0000 | ORAL_TABLET | Freq: Two times a day (BID) | ORAL | 0 refills | Status: AC

## 2021-08-31 ENCOUNTER — Encounter: Payer: Self-pay | Admitting: Internal Medicine

## 2021-09-06 ENCOUNTER — Other Ambulatory Visit: Payer: Self-pay | Admitting: Internal Medicine

## 2021-09-07 NOTE — Telephone Encounter (Signed)
Requested Prescriptions  Pending Prescriptions Disp Refills  . omeprazole (PRILOSEC) 20 MG capsule [Pharmacy Med Name: Omeprazole Oral Capsule Delayed Release 20 MG] 90 capsule 0    Sig: TAKE ONE CAPSULE BY MOUTH ONCE DAILY.     Gastroenterology: Proton Pump Inhibitors Passed - 09/06/2021 12:19 PM      Passed - Valid encounter within last 12 months    Recent Outpatient Visits          1 month ago Essential (primary) hypertension   [facility] Glean Hess, MD   2 months ago Acute non-recurrent maxillary sinusitis   [facility] Glean Hess, MD   5 months ago Essential (primary) hypertension   [facility] Glean Hess, MD   8 months ago Acute cough   [facility] Glean Hess, MD   9 months ago DM type 2, controlled, with complication [facility])   [facility] Glean Hess, MD      Future Appointments            In 2 months Army Melia Jesse Sans, MD [facility], [place]   In 10 months Army Melia Jesse Sans, MD [facility], [facility]

## 2021-09-28 ENCOUNTER — Other Ambulatory Visit: Payer: Self-pay | Admitting: Internal Medicine

## 2021-09-28 NOTE — Telephone Encounter (Signed)
Requested Prescriptions  Pending Prescriptions Disp Refills  . metFORMIN (GLUCOPHAGE-XR) 500 MG 24 hr tablet [Pharmacy Med Name: metFORMIN HCl ER Oral Tablet Extended Release 24 Hour 500 MG] 180 tablet 1    Sig: TAKE ONE TABLET BY MOUTH TWICE DAILY     Endocrinology:  Diabetes - Biguanides Failed - 09/28/2021 11:20 AM      Failed - B12 Level in normal range and within 720 days    No results found for: "VITAMINB12"       Passed - Cr in normal range and within 360 days    Creatinine, Ser  Date Value Ref Range Status  07/27/2021 0.94 0.57 - 1.00 mg/dL Final         Passed - HBA1C is between 0 and 7.9 and within 180 days    Hgb A1c MFr Bld  Date Value Ref Range Status  07/27/2021 6.8 (H) 4.8 - 5.6 % Final    Comment:             Prediabetes: 5.7 - 6.4          Diabetes: >6.4          Glycemic control for adults with diabetes: <7.0          Passed - eGFR in normal range and within 360 days    GFR calc Af Amer  Date Value Ref Range Status  11/16/2019 72 >59 mL/min/1.73 Final    Comment:    **[facility] currently reports eGFR in compliance with the current**   recommendations of the [employer]. [facility] will   update reporting as new guidelines are published from the NKF-ASN   Task force.    GFR calc non Af Amer  Date Value Ref Range Status  11/16/2019 62 >59 mL/min/1.73 Final   eGFR  Date Value Ref Range Status  07/27/2021 62 >59 mL/min/1.73 Final         Passed - Valid encounter within last 6 months    Recent Outpatient Visits          2 months ago Essential (primary) hypertension   [facility] Glean Hess, MD   3 months ago Acute non-recurrent maxillary sinusitis   [facility] Glean Hess, MD   5 months ago Essential (primary) hypertension   [facility] Glean Hess, MD   9 months ago Acute cough   [facility] Glean Hess, MD   10 months ago DM type 2, controlled, with complication [facility])    [facility] Glean Hess, MD      Future Appointments            In 2 months Glean Hess, MD [facility], [place]   In 10 months Glean Hess, MD [facility], [place] within normal limits and completed in the last 12 months    WBC  Date Value Ref Range Status  07/27/2021 7.6 3.4 - 10.8 x10E3/uL Final   RBC  Date Value Ref Range Status  07/27/2021 5.53 (H) 3.77 - 5.28 x10E6/uL Final   Hemoglobin  Date Value Ref Range Status  07/27/2021 15.6 11.1 - 15.9 g/dL Final   Hematocrit  Date Value Ref Range Status  07/27/2021 46.9 (H) 34.0 - 46.6 % Final   MCHC  Date Value Ref Range Status  07/27/2021 33.3 31.5 - 35.7 g/dL Final   [facility]  Date Value Ref  Range Status  07/27/2021 28.2 26.6 - 33.0 pg Final   MCV  Date Value Ref Range Status  07/27/2021 85 79 - 97 fL Final   No results found for: "PLTCOUNTKUC", "LABPLAT", "POCPLA" RDW  Date Value Ref Range Status  07/27/2021 15.3 11.7 - 15.4 % Final

## 2021-11-28 ENCOUNTER — Encounter: Payer: Self-pay | Admitting: Internal Medicine

## 2021-11-28 ENCOUNTER — Ambulatory Visit (INDEPENDENT_AMBULATORY_CARE_PROVIDER_SITE_OTHER): Payer: Medicare Other | Admitting: Internal Medicine

## 2021-11-28 VITALS — BP 136/70 | HR 83 | Ht 63.0 in | Wt 204.0 lb

## 2021-11-28 DIAGNOSIS — E118 Type 2 diabetes mellitus with unspecified complications: Secondary | ICD-10-CM | POA: Diagnosis not present

## 2021-11-28 DIAGNOSIS — Z23 Encounter for immunization: Secondary | ICD-10-CM

## 2021-11-28 DIAGNOSIS — I10 Essential (primary) hypertension: Secondary | ICD-10-CM

## 2021-11-28 DIAGNOSIS — G8929 Other chronic pain: Secondary | ICD-10-CM

## 2021-11-28 DIAGNOSIS — E785 Hyperlipidemia, unspecified: Secondary | ICD-10-CM

## 2021-11-28 DIAGNOSIS — N3000 Acute cystitis without hematuria: Secondary | ICD-10-CM

## 2021-11-28 DIAGNOSIS — B3731 Acute candidiasis of vulva and vagina: Secondary | ICD-10-CM

## 2021-11-28 DIAGNOSIS — E1169 Type 2 diabetes mellitus with other specified complication: Secondary | ICD-10-CM | POA: Diagnosis not present

## 2021-11-28 DIAGNOSIS — M5441 Lumbago with sciatica, right side: Secondary | ICD-10-CM

## 2021-11-28 LAB — POCT URINALYSIS DIPSTICK
Bilirubin, UA: NEGATIVE
Blood, UA: NEGATIVE
Glucose, UA: POSITIVE — AB
Ketones, UA: NEGATIVE
Leukocytes, UA: NEGATIVE
Nitrite, UA: NEGATIVE
Protein, UA: NEGATIVE
Spec Grav, UA: <=1.005 — AB (ref 1.010–1.025)
Urobilinogen, UA: 0.2 E.U./dL
pH, UA: 6 (ref 5.0–8.0)

## 2021-11-28 LAB — POCT GLYCOSYLATED HEMOGLOBIN (HGB A1C): Hemoglobin A1C: 6.4 % — AB (ref 4.0–5.6)

## 2021-11-28 MED ORDER — FLUCONAZOLE 100 MG PO TABS: 100.0000 mg | ORAL_TABLET | ORAL | 0 refills | Status: AC

## 2021-11-28 NOTE — Progress Notes (Signed)
Date:  11/28/2021   Name:  Kimberly Walls   DOB:  [birth year]   MRN:  [id number]   Chief Complaint: Diabetes  Diabetes She presents for her follow-up diabetic visit. She has type 2 diabetes mellitus. Pertinent negatives for hypoglycemia include no headaches, nervousness/anxiousness or tremors. Pertinent negatives for diabetes include no chest pain, no fatigue, no polydipsia and no polyuria. Pertinent negatives for diabetic complications include no CVA. Current diabetic treatments: metformin and jardiance. She is compliant with treatment all of the time.  Hyperlipidemia This is a chronic problem. The problem is uncontrolled. Recent lipid tests were reviewed and are high (LDL 83). Pertinent negatives include no chest pain, myalgias or shortness of breath. Current antihyperlipidemic treatment includes statins (atorvastatin 10 mg).  Hypertension This is a chronic problem. The problem is controlled. Pertinent negatives include no chest pain, headaches, palpitations or shortness of breath. Past treatments include diuretics, calcium channel blockers and ACE inhibitors. The current treatment provides moderate improvement. There are no compliance problems.  There is no history of kidney disease, CAD/MI or CVA.  Back Pain This is a recurrent problem. The problem occurs daily. The problem is unchanged. The pain is present in the lumbar spine. The quality of the pain is described as aching. The pain radiates to the right foot. Pertinent negatives include no abdominal pain, chest pain, dysuria, fever, headaches or numbness.  Vaginal Itching The patient's primary symptoms include genital itching and vaginal discharge (itching). This is a new problem. The current episode started in the past 7 days. The problem occurs constantly. The problem has been unchanged. The patient is experiencing no pain. The problem affects both sides. Associated symptoms include back pain. Pertinent negatives include no abdominal pain,  dysuria, fever, headaches or hematuria. Exacerbated by: started Jardiance. She has tried nothing for the symptoms.    Lab Results  Component Value Date   NA 139 07/27/2021   K 4.4 07/27/2021   CO2 22 07/27/2021   GLUCOSE 132 (H) 07/27/2021   BUN 25 07/27/2021   CREATININE 0.94 07/27/2021   CALCIUM 9.8 07/27/2021   EGFR 62 07/27/2021   GFRNONAA 62 11/16/2019   Lab Results  Component Value Date   CHOL 165 07/27/2021   HDL 43 07/27/2021   LDLCALC 83 07/27/2021   TRIG 232 (H) 07/27/2021   CHOLHDL 3.8 07/27/2021   Lab Results  Component Value Date   TSH 4.310 07/27/2021   Lab Results  Component Value Date   HGBA1C 6.4 (A) 11/28/2021   Lab Results  Component Value Date   WBC 7.6 07/27/2021   HGB 15.6 07/27/2021   HCT 46.9 (H) 07/27/2021   MCV 85 07/27/2021   PLT 256 07/27/2021   Lab Results  Component Value Date   ALT 17 07/27/2021   AST 27 07/27/2021   ALKPHOS 113 07/27/2021   BILITOT 0.5 07/27/2021   No results found for: "25OHVITD2", "25OHVITD3", "VD25OH"   Review of Systems  Constitutional:  Negative for appetite change, fatigue, fever and unexpected weight change.  HENT:  Negative for tinnitus and trouble swallowing.   Eyes:  Negative for visual disturbance.  Respiratory:  Negative for cough, chest tightness and shortness of breath.   Cardiovascular:  Negative for chest pain, palpitations and leg swelling.  Gastrointestinal:  Negative for abdominal pain.  Endocrine: Negative for polydipsia and polyuria.  Genitourinary:  Positive for vaginal discharge (itching). Negative for dysuria and hematuria.  Musculoskeletal:  Positive for back pain and gait problem (right leg feels  weak at time when walking). Negative for arthralgias and myalgias.  Neurological:  Negative for tremors, numbness and headaches.  Psychiatric/Behavioral:  Negative for dysphoric mood and sleep disturbance. The patient is not nervous/anxious.     Patient Active Problem List   Diagnosis  Date Noted   Arthralgia 07/27/2021   Mild aortic stenosis 11/06/2020   Atypical chest pain 09/12/2020   BMI 38.0-38.9,adult 06/29/2015   History of pulmonary embolus (PE) 06/29/2015   DM type 2, controlled, with complication ([place]) 16/11/9602   Arthritis, degenerative 12/05/2014   DDD (degenerative disc disease), lumbar 08/29/2014   Hyperlipidemia associated with type 2 diabetes mellitus ([place]) 08/29/2014   Essential (primary) hypertension 08/29/2014   Acid reflux 08/29/2014   Herpes 08/29/2014   Obstructive sleep apnea of adult 08/29/2014   Depression, major, recurrent, in partial remission ([place]) 08/29/2014   Headache, tension-type 08/29/2014   Dermatophytosis of groin 08/29/2014   History of colon polyps 02/19/2008   Personal history of malignant neoplasm of breast 02/19/1991    Allergies  Allergen Reactions   Prochlorperazine    Pneumococcal Vaccine Rash   Tetanus Toxoids Rash    Past Surgical History:  Procedure Laterality Date   BUNIONECTOMY Bilateral    CARPAL TUNNEL RELEASE Left    LUMBAR LAMINECTOMY     MASTECTOMY MODIFIED RADICAL Bilateral 1993   PARTIAL HYSTERECTOMY     TOTAL KNEE ARTHROPLASTY Bilateral    TOTAL SHOULDER REPLACEMENT Bilateral     Social History   Tobacco Use   Smoking status: Never   Smokeless tobacco: Never  Vaping Use   Vaping Use: Never used  Substance Use Topics   Alcohol use: Not Currently   Drug use: No     Medication list has been reviewed and updated.  Current Meds  Medication Sig   amLODipine (NORVASC) 5 MG tablet Take 1 tablet (5 mg total) by mouth daily.   ascorbic acid (VITAMIN C) 250 MG tablet Take 250 mg by mouth daily.   aspirin 81 MG tablet Take 1 tablet by mouth daily.   atorvastatin (LIPITOR) 10 MG tablet Take 1 tablet (10 mg total) by mouth daily.   Biotin 2500 MCG CAPS Take 5,000 mcg by mouth daily.   Calcium Carb-Cholecalciferol (CALCIUM + D3) 600-200 MG-UNIT TABS Take 1 tablet by mouth daily.   cetirizine  (ZYRTEC) 5 MG tablet Take 5 mg by mouth daily.   Coenzyme Q10 (COQ10) 200 MG CAPS Take 1 capsule by mouth daily.   desloratadine (CLARINEX) 5 MG tablet Take 5 mg by mouth daily.   escitalopram (LEXAPRO) 10 MG tablet TAKE ONE TABLET BY MOUTH ONCE DAILY   fluconazole (DIFLUCAN) 100 MG tablet Take 1 tablet (100 mg total) by mouth every other day for 6 days.   glucose blood test strip Use to test BS twice a day   JARDIANCE 10 MG TABS tablet Take 10 mg by mouth daily.   lisinopril (ZESTRIL) 40 MG tablet Take 1 tablet (40 mg total) by mouth daily.   metFORMIN (GLUCOPHAGE-XR) 500 MG 24 hr tablet TAKE ONE TABLET BY MOUTH TWICE DAILY   Multiple Vitamin (MULTIVITAMIN) capsule Take 1 capsule by mouth daily.   omeprazole (PRILOSEC) 20 MG capsule TAKE ONE CAPSULE BY MOUTH ONCE DAILY.   oxaprozin (DAYPRO) 600 MG tablet Take 1 tablet (600 mg total) by mouth 2 (two) times daily.   Probiotic Product (PROBIOTIC PO) Take by mouth.       07/27/2021   10:05 AM 06/19/2021    1:32 PM  04/03/2021   10:28 AM 12/01/2020   10:14 AM  GAD 7 : Generalized Anxiety Score  Nervous, Anxious, on Edge 1 1 1  0  Control/stop worrying 2 1 1 1   Worry too much - different things 1 1 1 1   Trouble relaxing 1 0 0 0  Restless 2 0 1 0  Easily annoyed or irritable 0 0 0 0  Afraid - awful might happen 0 0 0 0  Total GAD 7 Score 7 3 4 2   Anxiety Difficulty Somewhat difficult Not difficult at all Not difficult at all Not difficult at all       07/27/2021   10:05 AM 07/25/2021    1:12 PM 06/19/2021    1:31 PM  Depression screen PHQ 2/9  Decreased Interest 0 0 1  Down, Depressed, Hopeless 1 1 1   PHQ - 2 Score 1 1 2   Altered sleeping 3 0 0  Tired, decreased energy 1 1 1   Change in appetite 1 0 0  Feeling bad or failure about yourself  0 0 0  Trouble concentrating 0 0 0  Moving slowly or fidgety/restless 1 0 0  Suicidal thoughts 0 0 0  PHQ-9 Score 7 2 3   Difficult doing work/chores Somewhat difficult Not difficult at all Not  difficult at all    BP Readings from Last 3 Encounters:  11/28/21 136/70  07/27/21 136/82  06/19/21 140/84    Physical Exam Vitals and nursing note reviewed.  Constitutional:      General: She is not in acute distress.    Appearance: She is well-developed.  HENT:     Head: Normocephalic and atraumatic.  Cardiovascular:     Rate and Rhythm: Normal rate and regular rhythm.     Pulses: Normal pulses.     Heart sounds: No murmur heard. Pulmonary:     Effort: Pulmonary effort is normal. No respiratory distress.     Breath sounds: No wheezing or rhonchi.  Musculoskeletal:     Cervical back: Normal range of motion and neck supple.     Lumbar back: Tenderness present. Normal range of motion. Negative right straight leg raise test and negative left straight leg raise test.     Right lower leg: No edema.     Left lower leg: No edema.  Skin:    General: Skin is warm and dry.     Findings: No rash.  Neurological:     Mental Status: She is alert and oriented to person, place, and time.  Psychiatric:        Mood and Affect: Mood normal.        Behavior: Behavior normal.     Wt Readings from Last 3 Encounters:  11/28/21 204 lb (92.5 kg)  07/27/21 206 lb 3.2 oz (93.5 kg)  06/19/21 200 lb 6.4 oz (90.9 kg)    BP 136/70 (BP Location: Right Arm, Cuff Size: Large)   Pulse 83   Ht 5\' 3"  (1.6 m)   Wt 204 lb (92.5 kg)   SpO2 95%   BMI 36.14 kg/m   Assessment and Plan: 1. DM type 2, controlled, with complication ([place]) Clinically stable by exam and report without s/s of hypoglycemia. DM complicated by hypertension and dyslipidemia. Tolerating medications well without side effects or other concerns. - POCT glycosylated hemoglobin (Hb A1C)= 6.4  2. Chronic right-sided low back pain with right-sided sciatica Resume chiropractic care but see [facility] if symptoms do not improve or if they worsen.  3. Vaginal yeast infection  Likely a side effect of Jardiance - fluconazole  (DIFLUCAN) 100 MG tablet; Take 1 tablet (100 mg total) by mouth every other day for 6 days.  Dispense: 3 tablet; Refill: 0  4. Hyperlipidemia associated with type 2 diabetes mellitus ([place]) LDL not quite at goal Continue current medications and work on diet.  5. Essential (primary) hypertension Clinically stable exam with well controlled BP. Tolerating medications without side effects at this time. Pt to continue current regimen and low sodium diet; benefits of regular exercise as able discussed.  6. Acute cystitis without hematuria S/p treatment with Bactrim.  Pt requests a recheck. - POCT urinalysis dipstick - negative   Partially dictated using Editor, commissioning. Any errors are unintentional.  Halina Maidens, MD [facility]  11/28/2021

## 2021-12-24 ENCOUNTER — Other Ambulatory Visit: Payer: Self-pay | Admitting: Internal Medicine

## 2021-12-24 DIAGNOSIS — F3341 Major depressive disorder, recurrent, in partial remission: Secondary | ICD-10-CM

## 2021-12-24 DIAGNOSIS — M2550 Pain in unspecified joint: Secondary | ICD-10-CM

## 2021-12-25 NOTE — Telephone Encounter (Signed)
Requested Prescriptions  Pending Prescriptions Disp Refills   escitalopram (LEXAPRO) 10 MG tablet [Pharmacy Med Name: Escitalopram Oxalate Oral Tablet 10 MG] 90 tablet 1    Sig: TAKE ONE TABLET BY MOUTH ONCE DAILY     Psychiatry:  Antidepressants - SSRI Passed - 12/24/2021 10:25 AM      Passed - Completed PHQ-2 or PHQ-9 in the last 360 days      Passed - Valid encounter within last 6 months    Recent Outpatient Visits           3 weeks ago DM type 2, controlled, with complication ([place])   [place] [facility] at [facility], Jesse Sans, MD   5 months ago Essential (primary) hypertension   [place] [facility] at [facility], Jesse Sans, MD   6 months ago Acute non-recurrent maxillary sinusitis   [place] [facility] at [facility], Jesse Sans, MD   8 months ago Essential (primary) hypertension   [place] [facility] at [facility], Jesse Sans, MD   12 months ago Acute cough   [place] [facility] at [facility], Jesse Sans, MD       Future Appointments             In 3 months Army Melia Jesse Sans, MD [facility] at [facility], [facility]   In 7 months Army Melia, Jesse Sans, MD [facility] and [facility] Medicine at [facility], [place]             oxaprozin (DAYPRO) 600 MG tablet [Pharmacy Med Name: Oxaprozin Oral Tablet 600 MG] 180 tablet 0    Sig: TAKE ONE TABLET BY MOUTH TWICE DAILY     Analgesics:  NSAIDS Failed - 12/24/2021 10:25 AM      Failed - Manual Review: Labs are only required if the patient has taken medication for more than 8 weeks.      Failed - HCT in normal range and within 360 days    Hematocrit  Date Value Ref Range Status  07/27/2021 46.9 (H) 34.0 - 46.6 % Final         Passed - Cr in normal range and within 360 days    Creatinine, Ser  Date  Value Ref Range Status  07/27/2021 0.94 0.57 - 1.00 mg/dL Final         Passed - HGB in normal range and within 360 days    Hemoglobin  Date Value Ref Range Status  07/27/2021 15.6 11.1 - 15.9 g/dL Final         Passed - PLT in normal range and within 360 days    Platelets  Date Value Ref Range Status  07/27/2021 256 150 - 450 x10E3/uL Final         Passed - eGFR is 30 or above and within 360 days    GFR calc Af Amer  Date Value Ref Range Status  11/16/2019 72 >59 mL/min/1.73 Final    Comment:    **[facility] currently reports eGFR in compliance with the current**   recommendations of the [employer]. [facility] will   update reporting as new guidelines are published from the NKF-ASN   Task force.    GFR calc non Af Amer  Date Value Ref Range Status  11/16/2019 62 >59 mL/min/1.73 Final   eGFR  Date  Value Ref Range Status  07/27/2021 62 >59 mL/min/1.73 Final         Passed - Patient is not pregnant      Passed - Valid encounter within last 12 months    Recent Outpatient Visits           3 weeks ago DM type 2, controlled, with complication ([place])   [place] [facility] at [facility], Jesse Sans, MD   5 months ago Essential (primary) hypertension   [place] [facility] at [facility], Jesse Sans, MD   6 months ago Acute non-recurrent maxillary sinusitis   [place] [facility] at [facility], Jesse Sans, MD   8 months ago Essential (primary) hypertension   [place] [facility] at [facility], Jesse Sans, MD   12 months ago Acute cough   [place] [facility] at [facility], Jesse Sans, MD       Future Appointments             In 3 months Army Melia, Jesse Sans, MD [facility] and [facility] Medicine at [facility], [facility]   In 7 months Army Melia, Jesse Sans, MD [place] [facility] Medicine at [facility], [facility]

## 2022-02-28 ENCOUNTER — Other Ambulatory Visit: Payer: Self-pay | Admitting: Internal Medicine

## 2022-02-28 DIAGNOSIS — M2550 Pain in unspecified joint: Secondary | ICD-10-CM

## 2022-04-01 ENCOUNTER — Other Ambulatory Visit: Payer: Self-pay | Admitting: Internal Medicine

## 2022-04-01 DIAGNOSIS — E1169 Type 2 diabetes mellitus with other specified complication: Secondary | ICD-10-CM

## 2022-04-01 DIAGNOSIS — I10 Essential (primary) hypertension: Secondary | ICD-10-CM

## 2022-04-03 ENCOUNTER — Ambulatory Visit (INDEPENDENT_AMBULATORY_CARE_PROVIDER_SITE_OTHER): Payer: Medicare Other | Admitting: Internal Medicine

## 2022-04-03 ENCOUNTER — Encounter: Payer: Self-pay | Admitting: Internal Medicine

## 2022-04-03 VITALS — BP 122/78 | HR 88 | Ht 63.0 in | Wt 202.0 lb

## 2022-04-03 DIAGNOSIS — E118 Type 2 diabetes mellitus with unspecified complications: Secondary | ICD-10-CM | POA: Diagnosis not present

## 2022-04-03 DIAGNOSIS — I10 Essential (primary) hypertension: Secondary | ICD-10-CM

## 2022-04-03 DIAGNOSIS — J069 Acute upper respiratory infection, unspecified: Secondary | ICD-10-CM

## 2022-04-03 DIAGNOSIS — R1013 Epigastric pain: Secondary | ICD-10-CM

## 2022-04-03 DIAGNOSIS — Z7984 Long term (current) use of oral hypoglycemic drugs: Secondary | ICD-10-CM

## 2022-04-03 LAB — POCT GLYCOSYLATED HEMOGLOBIN (HGB A1C): Hemoglobin A1C: 6.5 % — AB (ref 4.0–5.6)

## 2022-04-03 NOTE — Assessment & Plan Note (Signed)
Having stomach burning with eating  No reflux but taking omeprazole daily Will get H PYlori test

## 2022-04-03 NOTE — Progress Notes (Signed)
Date:  04/03/2022   Name:  Kimberly Walls   DOB:  [birth year]   MRN:  [id number]   Chief Complaint: Hypertension and Diabetes  Hypertension This is a chronic problem. The problem is controlled. Pertinent negatives include no chest pain, headaches, palpitations or shortness of breath. Past treatments include ACE inhibitors, diuretics and calcium channel blockers. The current treatment provides significant improvement.  Diabetes She presents for her follow-up diabetic visit. She has type 2 diabetes mellitus. Pertinent negatives for hypoglycemia include no headaches, nervousness/anxiousness or tremors. Pertinent negatives for diabetes include no chest pain, no fatigue, no polydipsia and no polyuria. Current diabetic treatment includes oral agent (dual therapy) (jardiance and metformin). She is compliant with treatment all of the time.  Gastroesophageal Reflux She complains of abdominal pain. She reports no chest pain, no coughing or no nausea. burning sensation in abdomen after eeating. The problem occurs frequently. The problem has been unchanged. The symptoms are aggravated by certain foods. Pertinent negatives include no fatigue. She has tried a PPI for the symptoms. The treatment provided no relief.    Lab Results  Component Value Date   NA 139 07/27/2021   K 4.4 07/27/2021   CO2 22 07/27/2021   GLUCOSE 132 (H) 07/27/2021   BUN 25 07/27/2021   CREATININE 0.94 07/27/2021   CALCIUM 9.8 07/27/2021   EGFR 62 07/27/2021   GFRNONAA 62 11/16/2019   Lab Results  Component Value Date   CHOL 165 07/27/2021   HDL 43 07/27/2021   LDLCALC 83 07/27/2021   TRIG 232 (H) 07/27/2021   CHOLHDL 3.8 07/27/2021   Lab Results  Component Value Date   TSH 4.310 07/27/2021   Lab Results  Component Value Date   HGBA1C 6.5 (A) 04/03/2022   Lab Results  Component Value Date   WBC 7.6 07/27/2021   HGB 15.6 07/27/2021   HCT 46.9 (H) 07/27/2021   MCV 85 07/27/2021   PLT 256 07/27/2021   Lab  Results  Component Value Date   ALT 17 07/27/2021   AST 27 07/27/2021   ALKPHOS 113 07/27/2021   BILITOT 0.5 07/27/2021   No results found for: "25OHVITD2", "25OHVITD3", "VD25OH"   Review of Systems  Constitutional:  Negative for appetite change, fatigue, fever and unexpected weight change.  HENT:  Positive for tinnitus (last week with URI). Negative for trouble swallowing.   Eyes:  Negative for visual disturbance.  Respiratory:  Negative for cough, chest tightness and shortness of breath.   Cardiovascular:  Negative for chest pain, palpitations and leg swelling.  Gastrointestinal:  Positive for abdominal pain. Negative for nausea and vomiting.  Endocrine: Negative for polydipsia and polyuria.  Genitourinary:  Negative for dysuria and hematuria.  Musculoskeletal:  Positive for arthralgias and gait problem.  Neurological:  Negative for tremors, numbness and headaches.  Psychiatric/Behavioral:  Negative for dysphoric mood and sleep disturbance. The patient is not nervous/anxious.     Patient Active Problem List   Diagnosis Date Noted   Arthralgia 07/27/2021   Mild aortic stenosis 11/06/2020   Atypical chest pain 09/12/2020   BMI 38.0-38.9,adult 06/29/2015   History of pulmonary embolus (PE) 06/29/2015   DM type 2, controlled, with complication ([place]) [id number]   Arthritis, degenerative 12/05/2014   DDD (degenerative disc disease), lumbar 08/29/2014   Hyperlipidemia associated with type 2 diabetes mellitus ([place]) 08/29/2014   Essential (primary) hypertension 08/29/2014   Dyspepsia 08/29/2014   Herpes 08/29/2014   Obstructive sleep apnea of adult 08/29/2014   Depression, major,  recurrent, in partial remission ([place]) 08/29/2014   Headache, tension-type 08/29/2014   Dermatophytosis of groin 08/29/2014   History of colon polyps 02/19/2008   Personal history of malignant neoplasm of breast 02/19/1991    Allergies  Allergen Reactions   Prochlorperazine    Pneumococcal Vaccine  Rash   Tetanus Toxoids Rash    Past Surgical History:  Procedure Laterality Date   BUNIONECTOMY Bilateral    CARPAL TUNNEL RELEASE Left    LUMBAR LAMINECTOMY     MASTECTOMY MODIFIED RADICAL Bilateral 1993   PARTIAL HYSTERECTOMY     TOTAL KNEE ARTHROPLASTY Bilateral    TOTAL SHOULDER REPLACEMENT Bilateral     Social History   Tobacco Use   Smoking status: Never   Smokeless tobacco: Never  Vaping Use   Vaping Use: Never used  Substance Use Topics   Alcohol use: Not Currently   Drug use: No     Medication list has been reviewed and updated.  Current Meds  Medication Sig   amLODipine (NORVASC) 5 MG tablet Take 1 tablet (5 mg total) by mouth daily.   ascorbic acid (VITAMIN C) 250 MG tablet Take 250 mg by mouth daily.   aspirin 81 MG tablet Take 1 tablet by mouth daily.   atorvastatin (LIPITOR) 10 MG tablet TAKE ONE TABLET BY MOUTH ONCE DAILY   Biotin 2500 MCG CAPS Take 5,000 mcg by mouth daily.   Calcium Carb-Cholecalciferol (CALCIUM + D3) 600-200 MG-UNIT TABS Take 1 tablet by mouth daily.   cetirizine (ZYRTEC) 5 MG tablet Take 5 mg by mouth daily.   Cinnamon 500 MG TABS Take 2 tablets by mouth daily.   Coenzyme Q10 (COQ10) 200 MG CAPS Take 1 capsule by mouth daily.   desloratadine (CLARINEX) 5 MG tablet Take 5 mg by mouth daily.   Elderberry 500 MG CAPS Take by mouth.   escitalopram (LEXAPRO) 10 MG tablet TAKE ONE TABLET BY MOUTH ONCE DAILY   glucose blood test strip Use to test BS twice a day   hydrochlorothiazide (HYDRODIURIL) 25 MG tablet TAKE ONE TABLET BY MOUTH ONCE DAILY   JARDIANCE 10 MG TABS tablet Take 10 mg by mouth daily.   lisinopril (ZESTRIL) 40 MG tablet Take 1 tablet (40 mg total) by mouth daily.   metFORMIN (GLUCOPHAGE-XR) 500 MG 24 hr tablet TAKE ONE TABLET BY MOUTH TWICE DAILY   Multiple Vitamin (MULTIVITAMIN) capsule Take 1 capsule by mouth daily.   nystatin cream (MYCOSTATIN) Apply topically 2 (two) times daily. For rash under breast and abdomen    omeprazole (PRILOSEC) 20 MG capsule TAKE ONE CAPSULE BY MOUTH ONCE DAILY.   oxaprozin (DAYPRO) 600 MG tablet TAKE ONE TABLET BY MOUTH TWICE DAILY   Probiotic Product (PROBIOTIC PO) Take by mouth.   zinc gluconate 50 MG tablet Take 50 mg by mouth daily.       04/03/2022   10:36 AM 11/28/2021   10:41 AM 07/27/2021   10:05 AM 06/19/2021    1:32 PM  GAD 7 : Generalized Anxiety Score  Nervous, Anxious, on Edge 1 1 1 1  $ Control/stop worrying 0 1 2 1  $ Worry too much - different things 1 1 1 1  $ Trouble relaxing 0 1 1 0  Restless 1 1 2 $ 0  Easily annoyed or irritable 0 1 0 0  Afraid - awful might happen 0 0 0 0  Total GAD 7 Score 3 6 7 3  $ Anxiety Difficulty Not difficult at all Not difficult at all Somewhat difficult Not difficult  at all       04/03/2022   10:36 AM 11/28/2021   10:41 AM 07/27/2021   10:05 AM  Depression screen PHQ 2/9  Decreased Interest 0 1 0  Down, Depressed, Hopeless 1 1 1  $ PHQ - 2 Score 1 2 1  $ Altered sleeping 2 2 3  $ Tired, decreased energy 1 2 1  $ Change in appetite 1 1 1  $ Feeling bad or failure about yourself  0 0 0  Trouble concentrating 0 0 0  Moving slowly or fidgety/restless 0 0 1  Suicidal thoughts 0 0 0  PHQ-9 Score 5 7 7  $ Difficult doing work/chores Not difficult at all Somewhat difficult Somewhat difficult    BP Readings from Last 3 Encounters:  04/03/22 122/78  11/28/21 136/70  07/27/21 136/82    Physical Exam Vitals and nursing note reviewed.  Constitutional:      General: She is not in acute distress.    Appearance: She is well-developed.  HENT:     Head: Normocephalic and atraumatic.     Right Ear: Tympanic membrane normal.     Left Ear: Tympanic membrane normal.     Mouth/Throat:     Pharynx: Oropharynx is clear. Posterior oropharyngeal erythema present.     Tonsils: No tonsillar exudate.  Neck:     Vascular: No carotid bruit.  Cardiovascular:     Rate and Rhythm: Normal rate and regular rhythm.  Pulmonary:     Effort: Pulmonary  effort is normal. No respiratory distress.     Breath sounds: No wheezing or rhonchi.  Abdominal:     Palpations: Abdomen is soft.     Tenderness: There is no abdominal tenderness.  Musculoskeletal:     Cervical back: Normal range of motion.  Lymphadenopathy:     Cervical: No cervical adenopathy.  Skin:    General: Skin is warm and dry.     Findings: No rash.  Neurological:     Mental Status: She is alert and oriented to person, place, and time.  Psychiatric:        Mood and Affect: Mood normal.        Behavior: Behavior normal.     Wt Readings from Last 3 Encounters:  04/03/22 202 lb (91.6 kg)  11/28/21 204 lb (92.5 kg)  07/27/21 206 lb 3.2 oz (93.5 kg)    BP 122/78   Pulse 88   Ht 5' 3"$  (1.6 m)   Wt 202 lb (91.6 kg)   SpO2 96%   BMI 35.78 kg/m   Assessment and Plan: Problem List Items Addressed This Visit       Cardiovascular and Mediastinum   Essential (primary) hypertension (Chronic)    Clinically stable exam with well controlled BP on amlodipine, hctz, lisinopril. Tolerating medications without side effects. Pt to continue current regimen and low sodium diet.         Endocrine   DM type 2, controlled, with complication ([place]) - Primary (Chronic)    Clinically stable without s/s of hypoglycemia. Tolerating medications well without side effects or other concerns. BS at home 120 -140 A1C today 6.5 Continue current regimen       Relevant Orders   POCT glycosylated hemoglobin (Hb A1C) (Completed)   Microalbumin / creatinine urine ratio     Other   Dyspepsia    Having stomach burning with eating  No reflux but taking omeprazole daily Will get H PYlori test      Relevant Orders   H. pylori breath  test   Other Visit Diagnoses     Upper respiratory tract infection, unspecified type       resolving with supportive care exam essentially normal        Partially dictated using Dragon software. Any errors are unintentional.  Halina Maidens,  MD [facility]  04/03/2022

## 2022-04-03 NOTE — Assessment & Plan Note (Signed)
Clinically stable exam with well controlled BP on amlodipine, hctz, lisinopril. Tolerating medications without side effects. Pt to continue current regimen and low sodium diet.

## 2022-04-03 NOTE — Assessment & Plan Note (Signed)
Clinically stable without s/s of hypoglycemia. Tolerating medications well without side effects or other concerns. BS at home 120 -140 A1C today 6.5 Continue current regimen

## 2022-04-04 LAB — MICROALBUMIN / CREATININE URINE RATIO
Creatinine, Urine: 84.1 mg/dL
Microalb/Creat Ratio: 7 mg/g creat (ref 0–29)
Microalbumin, Urine: 6 ug/mL

## 2022-04-05 LAB — H. PYLORI BREATH TEST: H pylori Breath Test: NEGATIVE

## 2022-05-17 ENCOUNTER — Other Ambulatory Visit: Payer: Self-pay | Admitting: Internal Medicine

## 2022-05-17 ENCOUNTER — Encounter: Payer: Self-pay | Admitting: Internal Medicine

## 2022-05-17 NOTE — Telephone Encounter (Signed)
Requested medication (s) are due for refill today: yes  Requested medication (s) are on the active medication list: yes  Last refill:  09/12/20  Future visit scheduled: yes  Notes to clinic:  Unable to refill per protocol, last refill by another provider. Historical provider.     Requested Prescriptions  Pending Prescriptions Disp Refills   JARDIANCE 10 MG TABS tablet [Pharmacy Med Name: Jardiance Oral Tablet 10 MG] 30 tablet 0    Sig: TAKE ONE TABLET BY MOUTH ONCE DAILY     Endocrinology:  Diabetes - SGLT2 Inhibitors Passed - 05/17/2022  3:21 PM      Passed - Cr in normal range and within 360 days    Creatinine, Ser  Date Value Ref Range Status  07/27/2021 0.94 0.57 - 1.00 mg/dL Final         Passed - HBA1C is between 0 and 7.9 and within 180 days    Hemoglobin A1C  Date Value Ref Range Status  04/03/2022 6.5 (A) 4.0 - 5.6 % Final   Hgb A1c MFr Bld  Date Value Ref Range Status  07/27/2021 6.8 (H) 4.8 - 5.6 % Final    Comment:             Prediabetes: 5.7 - 6.4          Diabetes: >6.4          Glycemic control for adults with diabetes: <7.0          Passed - eGFR in normal range and within 360 days    GFR calc Af Amer  Date Value Ref Range Status  11/16/2019 72 >59 mL/min/1.73 Final    Comment:    **[facility] currently reports eGFR in compliance with the current**   recommendations of the [employer]. [facility] will   update reporting as new guidelines are published from the NKF-ASN   Task force.    GFR calc non Af Amer  Date Value Ref Range Status  11/16/2019 62 >59 mL/min/1.73 Final   eGFR  Date Value Ref Range Status  07/27/2021 62 >59 mL/min/1.73 Final         Passed - Valid encounter within last 6 months    Recent Outpatient Visits           1 month ago DM type 2, controlled, with complication ([place])   [place] at [facility], Jesse Sans, MD   5 months ago DM type 2, controlled, with  complication [facility])   [place] at [facility], Jesse Sans, MD   9 months ago Essential (primary) hypertension   [place] [facility] at [facility], Jesse Sans, MD   11 months ago Acute non-recurrent maxillary sinusitis   [place] at [facility], Jesse Sans, MD   1 year ago Essential (primary) hypertension   [place] [facility] [place] at [facility], Jesse Sans, MD       Future Appointments             In 2 months Army Melia Jesse Sans, MD [place] at [place]             desloratadine (CLARINEX) 5 MG tablet [Pharmacy Med Name: Desloratadine Oral Tablet 5 MG] 90 tablet 0    Sig: TAKE ONE TABLET BY MOUTH ONCE DAILY  Ear, Nose, and Throat:  Antihistamines Passed - 05/17/2022  3:21 PM      Passed - Valid encounter within last 12 months    Recent Outpatient Visits           1 month ago DM type 2, controlled, with complication [facility])   [place] at [facility], Jesse Sans, MD   5 months ago DM type 2, controlled, with complication [facility])   [place] at [facility], Jesse Sans, MD   9 months ago Essential (primary) hypertension   [place] [facility] [place] at [facility], Jesse Sans, MD   11 months ago Acute non-recurrent maxillary sinusitis   [place] at [facility], Jesse Sans, MD   1 year ago Essential (primary) hypertension   [place] [facility] at [facility], Jesse Sans, MD       Future Appointments             In 2 months Army Melia, Jesse Sans, MD [place] at [facility], [facility]

## 2022-05-17 NOTE — Telephone Encounter (Signed)
Please review.  KP

## 2022-05-19 ENCOUNTER — Other Ambulatory Visit: Payer: Self-pay

## 2022-05-19 ENCOUNTER — Other Ambulatory Visit: Payer: Self-pay | Admitting: Internal Medicine

## 2022-05-19 DIAGNOSIS — E118 Type 2 diabetes mellitus with unspecified complications: Secondary | ICD-10-CM

## 2022-05-19 MED ORDER — DESLORATADINE 5 MG PO TABS
5.0000 mg | ORAL_TABLET | Freq: Every day | ORAL | 3 refills | Status: DC
  Filled 2022-05-19: qty 90, 90d supply, fill #0

## 2022-05-19 MED ORDER — JARDIANCE 10 MG PO TABS: 10.0000 mg | ORAL_TABLET | Freq: Every day | ORAL | 1 refills | Status: DC

## 2022-05-24 ENCOUNTER — Encounter: Payer: Self-pay | Admitting: Internal Medicine

## 2022-05-26 ENCOUNTER — Other Ambulatory Visit: Payer: Self-pay | Admitting: Internal Medicine

## 2022-05-26 MED ORDER — DESLORATADINE 5 MG PO TABS: 5.0000 mg | ORAL_TABLET | Freq: Every day | ORAL | 3 refills | Status: DC

## 2022-05-27 ENCOUNTER — Other Ambulatory Visit: Payer: Self-pay

## 2022-06-03 LAB — HM DIABETES EYE EXAM

## 2022-07-31 ENCOUNTER — Encounter: Payer: Self-pay | Admitting: Internal Medicine

## 2022-07-31 ENCOUNTER — Ambulatory Visit (INDEPENDENT_AMBULATORY_CARE_PROVIDER_SITE_OTHER): Payer: Medicare Other | Admitting: Internal Medicine

## 2022-07-31 VITALS — BP 126/72 | HR 81 | Ht 63.0 in | Wt 204.0 lb

## 2022-07-31 DIAGNOSIS — E1169 Type 2 diabetes mellitus with other specified complication: Secondary | ICD-10-CM

## 2022-07-31 DIAGNOSIS — K5901 Slow transit constipation: Secondary | ICD-10-CM

## 2022-07-31 DIAGNOSIS — E118 Type 2 diabetes mellitus with unspecified complications: Secondary | ICD-10-CM

## 2022-07-31 DIAGNOSIS — I10 Essential (primary) hypertension: Secondary | ICD-10-CM | POA: Diagnosis not present

## 2022-07-31 DIAGNOSIS — H1031 Unspecified acute conjunctivitis, right eye: Secondary | ICD-10-CM

## 2022-07-31 DIAGNOSIS — E785 Hyperlipidemia, unspecified: Secondary | ICD-10-CM

## 2022-07-31 DIAGNOSIS — F3341 Major depressive disorder, recurrent, in partial remission: Secondary | ICD-10-CM

## 2022-07-31 DIAGNOSIS — Z7984 Long term (current) use of oral hypoglycemic drugs: Secondary | ICD-10-CM | POA: Diagnosis not present

## 2022-07-31 NOTE — Assessment & Plan Note (Signed)
Blood sugars stable without hypoglycemic symptoms or events. Currently being treated with metformin and Jardiance. Lab Results  Component Value Date   HGBA1C 6.5 (A) 04/03/2022

## 2022-07-31 NOTE — Assessment & Plan Note (Signed)
Stable exam with well controlled BP.  Currently taking amlodipine, coreg and lisinopril. Tolerating medications without concerns or side effects. Will continue to recommend low sodium diet and current regimen.

## 2022-07-31 NOTE — Assessment & Plan Note (Signed)
Tolerating statin medications.  No side effects noted. LDL is  Lab Results  Component Value Date   LDLCALC 83 07/27/2021

## 2022-07-31 NOTE — Progress Notes (Signed)
Date:  07/31/2022   Name:  NATALIEE ROLSTAD   DOB:  Sep 12, 1940   MRN:  [id number]   Chief Complaint: Annual Exam  ADINE MACEK is a 82 y.o. female who presents today for her Complete Annual Exam. She feels fairly well. She reports exercising - none. She reports she is sleeping fairly well. Breast complaints - none.  Still working for the [employer] and the [employer].  Mammogram: d/c'd DEXA: 07/2018 Colonoscopy: 2015  Health Maintenance Due  Topic Date Due   Zoster Vaccines- Shingrix (1 of 2) Never done   COVID-19 Vaccine (4 - 2023-24 season) 10/19/2021   OPHTHALMOLOGY EXAM  03/28/2022   Medicare Annual Wellness (AWV)  07/26/2022   Diabetic kidney evaluation - eGFR measurement  07/28/2022    Immunization History  Administered Date(s) Administered   Fluad Quad(high Dose 65+) 11/20/2018, 11/16/2019, 12/01/2020, 11/28/2021   Hepatitis A, Adult 09/21/2018, 05/05/2019   Influenza Split 02/02/2004, 01/02/2009   Influenza, High Dose Seasonal PF 11/19/2016, 11/17/2017   Influenza,inj,Quad PF,6+ Mos 01/01/2016   Influenza,inj,quad, With Preservative 11/19/2018   Influenza-Unspecified 11/25/2014   PFIZER(Purple Top)SARS-COV-2 Vaccination 03/24/2019, 04/14/2019, 03/28/2020   Pneumococcal Conjugate-13 06/27/2014   Pneumococcal Polysaccharide-23 07/20/2018   Tdap 07/01/2016     Hypertension This is a chronic problem. The problem is controlled. Pertinent negatives include no chest pain, headaches, palpitations or shortness of breath. Past treatments include calcium channel blockers, beta blockers and ACE inhibitors. There is no history of kidney disease, CAD/MI or CVA.  Diabetes She presents for her follow-up diabetic visit. She has type 2 diabetes mellitus. Her disease course has been stable. Hypoglycemia symptoms include nervousness/anxiousness. Pertinent negatives for hypoglycemia include no dizziness, headaches or tremors. Pertinent negatives for diabetes include no chest pain,  no fatigue, no polydipsia and no polyuria. Pertinent negatives for diabetic complications include no CVA.  Hyperlipidemia This is a chronic problem. Pertinent negatives include no chest pain or shortness of breath. Current antihyperlipidemic treatment includes statins. The current treatment provides significant improvement of lipids.  Depression        This is a chronic problem.The problem is unchanged.  Associated symptoms include no fatigue and no headaches.  Past treatments include SSRIs - Selective serotonin reuptake inhibitors.  Compliance with treatment is good.   Lab Results  Component Value Date   NA 139 07/27/2021   K 4.4 07/27/2021   CO2 22 07/27/2021   GLUCOSE 132 (H) 07/27/2021   BUN 25 07/27/2021   CREATININE 0.94 07/27/2021   CALCIUM 9.8 07/27/2021   EGFR 62 07/27/2021   GFRNONAA 62 11/16/2019   Lab Results  Component Value Date   CHOL 165 07/27/2021   HDL 43 07/27/2021   LDLCALC 83 07/27/2021   TRIG 232 (H) 07/27/2021   CHOLHDL 3.8 07/27/2021   Lab Results  Component Value Date   TSH 4.310 07/27/2021   Lab Results  Component Value Date   HGBA1C 6.5 (A) 04/03/2022   Lab Results  Component Value Date   WBC 7.6 07/27/2021   HGB 15.6 07/27/2021   HCT 46.9 (H) 07/27/2021   MCV 85 07/27/2021   PLT 256 07/27/2021   Lab Results  Component Value Date   ALT 17 07/27/2021   AST 27 07/27/2021   ALKPHOS 113 07/27/2021   BILITOT 0.5 07/27/2021   No results found for: "25OHVITD2", "25OHVITD3", "VD25OH"   Review of Systems  Constitutional:  Negative for chills, fatigue and fever.  HENT:  Negative for congestion, hearing loss,  tinnitus, trouble swallowing and voice change.   Eyes:  Positive for discharge and redness. Negative for visual disturbance.  Respiratory:  Negative for cough, chest tightness, shortness of breath and wheezing.   Cardiovascular:  Negative for chest pain, palpitations and leg swelling.  Gastrointestinal:  Negative for abdominal pain,  constipation, diarrhea and vomiting.  Endocrine: Negative for polydipsia and polyuria.  Genitourinary:  Negative for dysuria, frequency, genital sores, vaginal bleeding and vaginal discharge.  Musculoskeletal:  Negative for arthralgias, gait problem and joint swelling.  Skin:  Negative for color change and rash.  Neurological:  Negative for dizziness, tremors, light-headedness and headaches.  Hematological:  Negative for adenopathy. Does not bruise/bleed easily.  Psychiatric/Behavioral:  Positive for depression and dysphoric mood. Negative for sleep disturbance. The patient is nervous/anxious.     Patient Active Problem List   Diagnosis Date Noted   Arthralgia 07/27/2021   Mild aortic stenosis 11/06/2020   Atypical chest pain 09/12/2020   BMI 38.0-38.9,adult 06/29/2015   History of pulmonary embolus (PE) 06/29/2015   DM type 2, controlled, with complication (HCC) 03/06/2015   Arthritis, degenerative 12/05/2014   DDD (degenerative disc disease), lumbar 08/29/2014   Hyperlipidemia associated with type 2 diabetes mellitus (HCC) 08/29/2014   Essential (primary) hypertension 08/29/2014   Dyspepsia 08/29/2014   Herpes 08/29/2014   Obstructive sleep apnea of adult 08/29/2014   Depression, major, recurrent, in partial remission (HCC) 08/29/2014   Headache, tension-type 08/29/2014   History of colon polyps 02/19/2008   Personal history of malignant neoplasm of breast 02/19/1991    Allergies  Allergen Reactions   Prochlorperazine    Pneumococcal Vaccine Rash   Tetanus Toxoids Rash    Past Surgical History:  Procedure Laterality Date   BUNIONECTOMY Bilateral    CARPAL TUNNEL RELEASE Left    LUMBAR LAMINECTOMY     MASTECTOMY MODIFIED RADICAL Bilateral 1993   PARTIAL HYSTERECTOMY     TOTAL KNEE ARTHROPLASTY Bilateral    TOTAL SHOULDER REPLACEMENT Bilateral     Social History   Tobacco Use   Smoking status: Never   Smokeless tobacco: Never  Vaping Use   Vaping Use: Never used   Substance Use Topics   Alcohol use: Not Currently   Drug use: No     Medication list has been reviewed and updated.  Current Meds  Medication Sig   amLODipine (NORVASC) 5 MG tablet Take 1 tablet (5 mg total) by mouth daily.   ascorbic acid (VITAMIN C) 250 MG tablet Take 250 mg by mouth daily.   aspirin 81 MG tablet Take 1 tablet by mouth daily.   atorvastatin (LIPITOR) 10 MG tablet TAKE ONE TABLET BY MOUTH ONCE DAILY   Biotin 2500 MCG CAPS Take 5,000 mcg by mouth daily.   Calcium Carb-Cholecalciferol (CALCIUM + D3) 600-200 MG-UNIT TABS Take 1 tablet by mouth daily.   carvedilol (COREG) 6.25 MG tablet Take 6.25 mg by mouth 2 (two) times daily.   cetirizine (ZYRTEC) 5 MG tablet Take 5 mg by mouth daily.   Cinnamon 500 MG TABS Take 2 tablets by mouth daily.   Coenzyme Q10 (COQ10) 200 MG CAPS Take 1 capsule by mouth daily.   desloratadine (CLARINEX) 5 MG tablet Take 1 tablet (5 mg total) by mouth daily.   Elderberry 500 MG CAPS Take by mouth.   escitalopram (LEXAPRO) 10 MG tablet TAKE ONE TABLET BY MOUTH ONCE DAILY   glucose blood test strip Use to test BS twice a day   JARDIANCE 10 MG TABS tablet  Take 1 tablet (10 mg total) by mouth daily.   lisinopril (ZESTRIL) 40 MG tablet Take 1 tablet (40 mg total) by mouth daily.   metFORMIN (GLUCOPHAGE-XR) 500 MG 24 hr tablet TAKE ONE TABLET BY MOUTH TWICE DAILY   Multiple Vitamin (MULTIVITAMIN) capsule Take 1 capsule by mouth daily.   nystatin cream (MYCOSTATIN) Apply topically 2 (two) times daily. For rash under breast and abdomen   omeprazole (PRILOSEC) 20 MG capsule TAKE ONE CAPSULE BY MOUTH ONCE DAILY.   oxaprozin (DAYPRO) 600 MG tablet TAKE ONE TABLET BY MOUTH TWICE DAILY   Probiotic Product (PROBIOTIC PO) Take by mouth.   zinc gluconate 50 MG tablet Take 50 mg by mouth daily.   [DISCONTINUED] hydrochlorothiazide (HYDRODIURIL) 25 MG tablet TAKE ONE TABLET BY MOUTH ONCE DAILY       07/31/2022    9:47 AM 04/03/2022   10:36 AM 11/28/2021    10:41 AM 07/27/2021   10:05 AM  GAD 7 : Generalized Anxiety Score  Nervous, Anxious, on Edge 1 1 1 1   Control/stop worrying 1 0 1 2  Worry too much - different things 1 1 1 1   Trouble relaxing 0 0 1 1  Restless 0 1 1 2   Easily annoyed or irritable 0 0 1 0  Afraid - awful might happen 0 0 0 0  Total GAD 7 Score 3 3 6 7   Anxiety Difficulty Somewhat difficult Not difficult at all Not difficult at all Somewhat difficult       07/31/2022    9:47 AM 04/03/2022   10:36 AM 11/28/2021   10:41 AM  Depression screen PHQ 2/9  Decreased Interest 0 0 1  Down, Depressed, Hopeless 1 1 1   PHQ - 2 Score 1 1 2   Altered sleeping 2 2 2   Tired, decreased energy 1 1 2   Change in appetite 1 1 1   Feeling bad or failure about yourself  0 0 0  Trouble concentrating 1 0 0  Moving slowly or fidgety/restless 0 0 0  Suicidal thoughts 0 0 0  PHQ-9 Score 6 5 7   Difficult doing work/chores Somewhat difficult Not difficult at all Somewhat difficult    BP Readings from Last 3 Encounters:  07/31/22 126/72  04/03/22 122/78  11/28/21 136/70    Physical Exam Vitals and nursing note reviewed.  Constitutional:      General: She is not in acute distress.    Appearance: She is well-developed.  HENT:     Head: Normocephalic and atraumatic.     Right Ear: Tympanic membrane and ear canal normal.     Left Ear: Tympanic membrane and ear canal normal.     Nose:     Right Sinus: No maxillary sinus tenderness.     Left Sinus: No maxillary sinus tenderness.  Eyes:     General: Lids are normal. No scleral icterus.       Right eye: No discharge.        Left eye: No discharge.     Conjunctiva/sclera:     Right eye: Right conjunctiva is injected.  Neck:     Thyroid: No thyromegaly.     Vascular: No carotid bruit.  Cardiovascular:     Rate and Rhythm: Normal rate and regular rhythm.     Pulses: Normal pulses.     Heart sounds: Normal heart sounds.  Pulmonary:     Effort: Pulmonary effort is normal. No  respiratory distress.     Breath sounds: No wheezing.  Chest:  Breasts:    Right: Absent. No mass.     Left: Absent. No mass.  Abdominal:     General: Bowel sounds are normal.     Palpations: Abdomen is soft.     Tenderness: There is no abdominal tenderness.  Musculoskeletal:     Cervical back: Normal range of motion. No erythema.     Right lower leg: No edema.     Left lower leg: No edema.  Lymphadenopathy:     Cervical: No cervical adenopathy.  Skin:    General: Skin is warm and dry.     Findings: No rash.  Neurological:     Mental Status: She is alert and oriented to person, place, and time.     Cranial Nerves: No cranial nerve deficit.     Sensory: No sensory deficit.     Deep Tendon Reflexes: Reflexes are normal and symmetric.  Psychiatric:        Attention and Perception: Attention normal.        Mood and Affect: Mood normal.     Wt Readings from Last 3 Encounters:  07/31/22 204 lb (92.5 kg)  04/03/22 202 lb (91.6 kg)  11/28/21 204 lb (92.5 kg)    BP 126/72   Pulse 81   Ht 5\' 3"  (1.6 m)   Wt 204 lb (92.5 kg)   SpO2 97%   BMI 36.14 kg/m   Assessment and Plan:  Problem List Items Addressed This Visit     Hyperlipidemia associated with type 2 diabetes mellitus (HCC) (Chronic)    Tolerating statin medications.  No side effects noted. LDL is  Lab Results  Component Value Date   LDLCALC 83 07/27/2021        Relevant Medications   carvedilol (COREG) 6.25 MG tablet   Other Relevant Orders   Lipid panel   Essential (primary) hypertension - Primary (Chronic)    Stable exam with well controlled BP.  Currently taking amlodipine, coreg and lisinopril. Tolerating medications without concerns or side effects. Will continue to recommend low sodium diet and current regimen.       Relevant Medications   carvedilol (COREG) 6.25 MG tablet   Other Relevant Orders   CBC with Differential/Platelet   Comprehensive metabolic panel   TSH   DM type 2, controlled,  with complication (HCC) (Chronic)    Blood sugars stable without hypoglycemic symptoms or events. Currently being treated with metformin and Jardiance. Lab Results  Component Value Date   HGBA1C 6.5 (A) 04/03/2022        Relevant Orders   Hemoglobin A1c   Microalbumin / creatinine urine ratio   Depression, major, recurrent, in partial remission (HCC) (Chronic)    Clinically stable on current regimen with fairly good control of symptoms, No SI or HI. She is interested in counseling - number to [facility] [address] is provided plus several on line services No change in management at this time - Lexapro 10 mg.       Other Visit Diagnoses     Long term current use of oral hypoglycemic drug       Acute bacterial conjunctivitis of right eye       continue Cortisporin ophthalmic suspension   Slow transit constipation       Recommend trial of Miralax powder daily - adjust dose to effect   Relevant Orders   TSH       Return in about 4 months (around 11/30/2022) for DM, HTN.   Partially dictated using [employer],  any errors are not intentional.  Reubin Milan, MD [facility] [place], [place]

## 2022-07-31 NOTE — Assessment & Plan Note (Addendum)
Clinically stable on current regimen with fairly good control of symptoms, No SI or HI. She is interested in counseling - number to [facility] is provided plus several on line services No change in management at this time - Lexapro 10 mg.

## 2022-07-31 NOTE — Patient Instructions (Signed)
Try Glycolax (brand name Miralax) powder daily to help resolve constipation.  Use the antibiotic eye drops three times a day for at least 5 days.

## 2022-08-01 ENCOUNTER — Encounter: Payer: Self-pay | Admitting: Internal Medicine

## 2022-08-01 ENCOUNTER — Ambulatory Visit (INDEPENDENT_AMBULATORY_CARE_PROVIDER_SITE_OTHER): Payer: Medicare Other

## 2022-08-01 VITALS — Ht 63.0 in | Wt 204.0 lb

## 2022-08-01 DIAGNOSIS — Z0000 Encounter for general adult medical examination without abnormal findings: Secondary | ICD-10-CM

## 2022-08-01 LAB — CBC WITH DIFFERENTIAL/PLATELET
Basophils Absolute: 0.1 10*3/uL (ref 0.0–0.2)
Basos: 1 %
EOS (ABSOLUTE): 0.4 10*3/uL (ref 0.0–0.4)
Eos: 5 %
Hematocrit: 46 % (ref 34.0–46.6)
Hemoglobin: 15.1 g/dL (ref 11.1–15.9)
Immature Grans (Abs): 0 10*3/uL (ref 0.0–0.1)
Immature Granulocytes: 0 %
Lymphocytes Absolute: 2.6 10*3/uL (ref 0.7–3.1)
Lymphs: 32 %
MCH: 28.1 pg (ref 26.6–33.0)
MCHC: 32.8 g/dL (ref 31.5–35.7)
MCV: 86 fL (ref 79–97)
Monocytes Absolute: 0.9 10*3/uL (ref 0.1–0.9)
Monocytes: 11 %
Neutrophils Absolute: 4.2 10*3/uL (ref 1.4–7.0)
Neutrophils: 51 %
Platelets: 246 10*3/uL (ref 150–450)
RBC: 5.37 x10E6/uL — ABNORMAL HIGH (ref 3.77–5.28)
RDW: 14.6 % (ref 11.7–15.4)
WBC: 8.3 10*3/uL (ref 3.4–10.8)

## 2022-08-01 LAB — COMPREHENSIVE METABOLIC PANEL
ALT: 16 IU/L (ref 0–32)
AST: 30 IU/L (ref 0–40)
Albumin/Globulin Ratio: 1.7
Albumin: 4.3 g/dL (ref 3.8–4.8)
Alkaline Phosphatase: 102 IU/L (ref 44–121)
BUN/Creatinine Ratio: 23 (ref 12–28)
BUN: 24 mg/dL (ref 8–27)
Bilirubin Total: 0.4 mg/dL (ref 0.0–1.2)
CO2: 23 mmol/L (ref 20–29)
Calcium: 9.9 mg/dL (ref 8.7–10.3)
Chloride: 101 mmol/L (ref 96–106)
Creatinine, Ser: 1.06 mg/dL — ABNORMAL HIGH (ref 0.57–1.00)
Globulin, Total: 2.6 g/dL (ref 1.5–4.5)
Glucose: 126 mg/dL — ABNORMAL HIGH (ref 70–99)
Potassium: 4.5 mmol/L (ref 3.5–5.2)
Sodium: 138 mmol/L (ref 134–144)
Total Protein: 6.9 g/dL (ref 6.0–8.5)
eGFR: 53 mL/min/{1.73_m2} — ABNORMAL LOW (ref >59)

## 2022-08-01 LAB — MICROALBUMIN / CREATININE URINE RATIO
Creatinine, Urine: 32.9 mg/dL
Microalb/Creat Ratio: <9 mg/g creat (ref 0–29)
Microalbumin, Urine: <3 ug/mL

## 2022-08-01 LAB — HEMOGLOBIN A1C
Est. average glucose Bld gHb Est-mCnc: 151 mg/dL
Hgb A1c MFr Bld: 6.9 % — ABNORMAL HIGH (ref 4.8–5.6)

## 2022-08-01 LAB — LIPID PANEL
Chol/HDL Ratio: 3.8 ratio (ref 0.0–4.4)
Cholesterol, Total: 158 mg/dL (ref 100–199)
HDL: 42 mg/dL (ref >39)
LDL Chol Calc (NIH): 76 mg/dL (ref 0–99)
Triglycerides: 244 mg/dL — ABNORMAL HIGH (ref 0–149)
VLDL Cholesterol Cal: 40 mg/dL (ref 5–40)

## 2022-08-01 LAB — TSH: TSH: 3.27 u[IU]/mL (ref 0.450–4.500)

## 2022-08-01 NOTE — Progress Notes (Signed)
I connected with  Cyndy Freeze on 08/01/22 by a audio enabled telemedicine application and verified that I am speaking with the correct person using two identifiers.  Patient Location: Home  Provider Location: Office/Clinic  I discussed the limitations of evaluation and management by telemedicine. The patient expressed understanding and agreed to proceed.  Subjective:   LYNITA BUETOW is a 82 y.o. female who presents for Medicare Annual (Subsequent) preventive examination.  Review of Systems     Cardiac Risk Factors include: advanced age (>95men, >30 women);dyslipidemia;hypertension;obesity (BMI >30kg/m2);diabetes mellitus;sedentary lifestyle     Objective:    Today's Vitals   08/01/22 1101 08/01/22 1117  Weight:  204 lb (92.5 kg)  Height:  5\' 3"  (1.6 m)  PainSc: 0-No pain    Body mass index is 36.14 kg/m.     08/01/2022   11:06 AM 07/25/2021    1:14 PM 07/24/2020    2:50 PM 07/14/2019    3:36 PM 07/07/2017   10:43 AM 06/29/2015   10:45 AM 03/06/2015   10:34 AM  Advanced Directives  Does Patient Have a Medical Advance Directive? No Yes No No No No No  Type of Advance Directive  Healthcare Power of Attorney       Does patient want to make changes to medical advance directive?   No - Patient declined      Copy of Healthcare Power of Attorney in Chart?  No - copy requested       Would patient like information on creating a medical advance directive? No - Patient declined   No - Patient declined Yes (MAU/Ambulatory/Procedural Areas - Information given) No - patient declined information No - patient declined information    Current Medications (verified) Outpatient Encounter Medications as of 08/01/2022  Medication Sig   amLODipine (NORVASC) 5 MG tablet Take 1 tablet (5 mg total) by mouth daily.   ascorbic acid (VITAMIN C) 250 MG tablet Take 250 mg by mouth daily.   aspirin 81 MG tablet Take 1 tablet by mouth daily.   atorvastatin (LIPITOR) 10 MG tablet TAKE ONE TABLET BY MOUTH  ONCE DAILY   Biotin 2500 MCG CAPS Take 5,000 mcg by mouth daily.   Calcium Carb-Cholecalciferol (CALCIUM + D3) 600-200 MG-UNIT TABS Take 1 tablet by mouth daily.   carvedilol (COREG) 6.25 MG tablet Take 6.25 mg by mouth 2 (two) times daily.   cetirizine (ZYRTEC) 5 MG tablet Take 5 mg by mouth daily.   Cinnamon 500 MG TABS Take 2 tablets by mouth daily.   Coenzyme Q10 (COQ10) 200 MG CAPS Take 1 capsule by mouth daily.   desloratadine (CLARINEX) 5 MG tablet Take 1 tablet (5 mg total) by mouth daily.   Elderberry 500 MG CAPS Take by mouth.   escitalopram (LEXAPRO) 10 MG tablet TAKE ONE TABLET BY MOUTH ONCE DAILY   glucose blood test strip Use to test BS twice a day   JARDIANCE 10 MG TABS tablet Take 1 tablet (10 mg total) by mouth daily.   lisinopril (ZESTRIL) 40 MG tablet Take 1 tablet (40 mg total) by mouth daily.   metFORMIN (GLUCOPHAGE-XR) 500 MG 24 hr tablet TAKE ONE TABLET BY MOUTH TWICE DAILY   Multiple Vitamin (MULTIVITAMIN) capsule Take 1 capsule by mouth daily.   nystatin cream (MYCOSTATIN) Apply topically 2 (two) times daily. For rash under breast and abdomen   omeprazole (PRILOSEC) 20 MG capsule TAKE ONE CAPSULE BY MOUTH ONCE DAILY.   oxaprozin (DAYPRO) 600 MG tablet TAKE ONE TABLET  BY MOUTH TWICE DAILY   Probiotic Product (PROBIOTIC PO) Take by mouth.   zinc gluconate 50 MG tablet Take 50 mg by mouth daily.   No facility-administered encounter medications on file as of 08/01/2022.    Allergies (verified) Prochlorperazine, Pneumococcal vaccine, and Tetanus toxoids   History: Past Medical History:  Diagnosis Date   Allergy    Anxiety    Depression    Diabetes mellitus without complication (HCC)    GERD (gastroesophageal reflux disease)    Hyperlipidemia    Hypertension    Sleep apnea    does not use CPAP   Past Surgical History:  Procedure Laterality Date   BUNIONECTOMY Bilateral    CARPAL TUNNEL RELEASE Left    LUMBAR LAMINECTOMY     MASTECTOMY MODIFIED RADICAL  Bilateral 1993   PARTIAL HYSTERECTOMY     TOTAL KNEE ARTHROPLASTY Bilateral    TOTAL SHOULDER REPLACEMENT Bilateral    Family History  Problem Relation Age of Onset   Diabetes Mother    Hypertension Mother    CAD Father    Diabetes Father    Diabetes Sister    Hypertension Sister    Heart disease Brother    Healthy Sister    Social History   Socioeconomic History   Marital status: Divorced    Spouse name: Not on file   Number of children: 1   Years of education: 12+   Highest education level: Some college, no degree  Occupational History   Occupation: Retired    Comment: works part time for [employer]    Comment: also works for [employer]  Tobacco Use   Smoking status: Never   Smokeless tobacco: Never  Vaping Use   Vaping Use: Never used  Substance and Sexual Activity   Alcohol use: Not Currently   Drug use: No   Sexual activity: Not Currently  Other Topics Concern   Not on file  Social History Narrative   Pt lives alone.    Social Determinants of Health   Financial Resource Strain: Low Risk  (08/01/2022)   Overall Financial Resource Strain (CARDIA)    Difficulty of Paying Living Expenses: Not hard at all  Food Insecurity: No Food Insecurity (08/01/2022)   Hunger Vital Sign    Worried About Running Out of Food in the Last Year: Never true    Ran Out of Food in the Last Year: Never true  Transportation Needs: No Transportation Needs (08/01/2022)   PRAPARE - Administrator, Civil Service (Medical): No    Lack of Transportation (Non-Medical): No  Physical Activity: Inactive (08/01/2022)   Exercise Vital Sign    Days of Exercise per Week: 0 days    Minutes of Exercise per Session: 0 min  Stress: No Stress Concern Present (08/01/2022)   [employer] of Occupational Health - Occupational Stress Questionnaire    Feeling of Stress : Only a little  Social Connections: Socially Isolated (08/01/2022)   Social Connection and Isolation Panel  [NHANES]    Frequency of Communication with Friends and Family: More than three times a week    Frequency of Social Gatherings with Friends and Family: Once a week    Attends Religious Services: Never    Database administrator or Organizations: No    Attends Banker Meetings: Never    Marital Status: Divorced    Tobacco Counseling Counseling given: Not Answered   Clinical Intake:  Pre-visit preparation completed: Yes  Pain : No/denies pain  Pain Score: 0-No pain     Nutritional Risks: None Diabetes: Yes CBG done?: No Did pt. bring in CBG monitor from home?: No  How often do you need to have someone help you when you read instructions, pamphlets, or other written materials from your doctor or pharmacy?: 1 - Never  Diabetic?yes Nutrition Risk Assessment:  Has the patient had any N/V/D within the last 2 months?  No  Does the patient have any non-healing wounds?  No  Has the patient had any unintentional weight loss or weight gain?  No   Diabetes:  Is the patient diabetic?  Yes  If diabetic, was a CBG obtained today?  No  Did the patient bring in their glucometer from home?  No  How often do you monitor your CBG's? Couple times per week.   Financial Strains and Diabetes Management:  Are you having any financial strains with the device, your supplies or your medication? No .  Does the patient want to be seen by Chronic Care Management for management of their diabetes?  No  Would the patient like to be referred to a Nutritionist or for Diabetic Management?  No   Diabetic Exams:  Diabetic Eye Exam: Completed 03/28/21. Overdue for diabetic eye exam. Pt has been advised about the importance in completing this exam. \ Diabetic Foot Exam: Completed 07/31/22. Pt has been advised about the importance in completing this exam.    Interpreter Needed?: No  Information entered by :: Kennedy Bucker, LPN   Activities of Daily Living    08/01/2022   11:07 AM  In your  present state of health, do you have any difficulty performing the following activities:  Hearing? 0  Vision? 0  Difficulty concentrating or making decisions? 0  Walking or climbing stairs? 1  Dressing or bathing? 0  Doing errands, shopping? 0  Preparing Food and eating ? N  Using the Toilet? N  In the past six months, have you accidently leaked urine? N  Do you have problems with loss of bowel control? N  Managing your Medications? N  Managing your Finances? N  Housekeeping or managing your Housekeeping? N    Patient Care Team: Reubin Milan, MD as PCP - General (Internal Medicine) Donald Pore, MD as Referring Physician (Ophthalmology)  Indicate any recent Medical Services you may have received from other than [facility] providers in the past year (date may be approximate).     Assessment:   This is a routine wellness examination for Eboni.  Hearing/Vision screen Hearing Screening - Comments:: No aids Vision Screening - Comments:: Glasses when driving- Dr.Novey in [place]   Dietary issues and exercise activities discussed: Current Exercise Habits: The patient does not participate in regular exercise at present   Goals Addressed             This Visit's Progress    DIET - EAT MORE FRUITS AND VEGETABLES         Depression Screen    08/01/2022   11:04 AM 07/31/2022    9:47 AM 04/03/2022   10:36 AM 11/28/2021   10:41 AM 07/27/2021   10:05 AM 07/25/2021    1:12 PM 06/19/2021    1:31 PM  PHQ 2/9 Scores  PHQ - 2 Score 2 1 1 2 1 1 2   PHQ- 9 Score 3 6 5 7 7 2 3     Fall Risk    08/01/2022   11:07 AM 07/31/2022    9:47 AM 04/03/2022  10:37 AM 11/28/2021   10:41 AM 07/27/2021   10:06 AM  Fall Risk   Falls in the past year? 0 0 0 0 0  Number falls in past yr: 0 0 0 0 0  Injury with Fall? 0 0 0 0 0  Risk for fall due to : No Fall Risks No Fall Risks No Fall Risks No Fall Risks No Fall Risks  Follow up Falls prevention discussed;Falls evaluation completed Falls  evaluation completed Falls evaluation completed Falls evaluation completed Falls evaluation completed    FALL RISK PREVENTION PERTAINING TO THE HOME:  Any stairs in or around the home? Yes  If so, are there any without handrails? No  Home free of loose throw rugs in walkways, pet beds, electrical cords, etc? Yes  Adequate lighting in your home to reduce risk of falls? Yes   ASSISTIVE DEVICES UTILIZED TO PREVENT FALLS:  Life alert? No  Use of a cane, walker or w/c? No  Grab bars in the bathroom? Yes  Shower chair or bench in shower? Yes  Elevated toilet seat or a handicapped toilet? Yes    Cognitive Function:        08/01/2022   11:13 AM 07/14/2019    3:44 PM 07/08/2018   10:27 AM 07/07/2017   10:51 AM 07/01/2016    8:25 AM  6CIT Screen  What Year? 0 points 0 points 0 points 0 points 0 points  What month? 0 points 0 points 0 points 0 points 0 points  What time? 0 points 0 points 0 points 0 points 0 points  Count back from 20 0 points 0 points 0 points 0 points 0 points  Months in reverse 0 points 0 points 0 points 0 points 0 points  Repeat phrase 0 points 0 points 0 points 0 points 0 points  Total Score 0 points 0 points 0 points 0 points 0 points    Immunizations Immunization History  Administered Date(s) Administered   Fluad Quad(high Dose 65+) 11/20/2018, 11/16/2019, 12/01/2020, 11/28/2021   Hepatitis A, Adult 09/21/2018, 05/05/2019   Influenza Split 02/02/2004, 01/02/2009   Influenza, High Dose Seasonal PF 11/19/2016, 11/17/2017   Influenza,inj,Quad PF,6+ Mos 01/01/2016   Influenza,inj,quad, With Preservative 11/19/2018   Influenza-Unspecified 11/25/2014   PFIZER(Purple Top)SARS-COV-2 Vaccination 03/24/2019, 04/14/2019, 03/28/2020   Pneumococcal Conjugate-13 06/27/2014   Pneumococcal Polysaccharide-23 07/20/2018   Tdap 07/01/2016    TDAP status: Up to date  Flu Vaccine status: Up to date  Pneumococcal vaccine status: Up to date  Covid-19 vaccine status:  Completed vaccines  Qualifies for Shingles Vaccine? Yes   Zostavax completed No   Shingrix Completed?: No.    Education has been provided regarding the importance of this vaccine. Patient has been advised to call insurance company to determine out of pocket expense if they have not yet received this vaccine. Advised may also receive vaccine at local pharmacy or Health Dept. Verbalized acceptance and understanding.  Screening Tests Health Maintenance  Topic Date Due   Zoster Vaccines- Shingrix (1 of 2) Never done   COVID-19 Vaccine (4 - 2023-24 season) 10/19/2021   OPHTHALMOLOGY EXAM  03/28/2022   Diabetic kidney evaluation - eGFR measurement  07/28/2022   INFLUENZA VACCINE  09/19/2022   HEMOGLOBIN A1C  10/02/2022   Diabetic kidney evaluation - Urine ACR  04/04/2023   FOOT EXAM  07/31/2023   Medicare Annual Wellness (AWV)  08/01/2023   DTaP/Tdap/Td (2 - Td or Tdap) 07/02/2026   Pneumonia Vaccine 71+ Years old  Completed  DEXA SCAN  Completed   HPV VACCINES  Aged Out   Hepatitis C Screening  Discontinued    Health Maintenance  Health Maintenance Due  Topic Date Due   Zoster Vaccines- Shingrix (1 of 2) Never done   COVID-19 Vaccine (4 - 2023-24 season) 10/19/2021   OPHTHALMOLOGY EXAM  03/28/2022   Diabetic kidney evaluation - eGFR measurement  07/28/2022    Colorectal cancer screening: No longer required.   Mammogram status: No longer required due to age.  Bone Density status: Completed 07/23/18. Results reflect: Bone density results: NORMAL. Repeat every 5 years.  Lung Cancer Screening: (Low Dose CT Chest recommended if Age 68-80 years, 30 pack-year currently smoking OR have quit w/in 15years.) does not qualify.   Additional Screening:  Hepatitis C Screening: does not qualify; Completed 03/02/20  Vision Screening: Recommended annual ophthalmology exams for early detection of glaucoma and other disorders of the eye. Is the patient up to date with their annual eye exam?  Yes   Who is the provider or what is the name of the office in which the patient attends annual eye exams? Dr.Novey If pt is not established with a provider, would they like to be referred to a provider to establish care? No .   Dental Screening: Recommended annual dental exams for proper oral hygiene  Community Resource Referral / Chronic Care Management: CRR required this visit?  No   CCM required this visit?  No      Plan:     I have personally reviewed and noted the following in the patient's chart:   Medical and social history Use of alcohol, tobacco or illicit drugs  Current medications and supplements including opioid prescriptions. Patient is not currently taking opioid prescriptions. Functional ability and status Nutritional status Physical activity Advanced directives List of other physicians Hospitalizations, surgeries, and ER visits in previous 12 months Vitals Screenings to include cognitive, depression, and falls Referrals and appointments  In addition, I have reviewed and discussed with patient certain preventive protocols, quality metrics, and best practice recommendations. A written personalized care plan for preventive services as well as general preventive health recommendations were provided to patient.     Hal Hope, LPN   1/61/0960   Nurse Notes: none

## 2022-08-01 NOTE — Patient Instructions (Signed)
Ms. Kimberly Walls , Thank you for taking time to come for your Medicare Wellness Visit. I appreciate your ongoing commitment to your health goals. Please review the following plan we discussed and let me know if I can assist you in the future.   These are the goals we discussed:  Goals      DIET - EAT MORE FRUITS AND VEGETABLES     DIET - INCREASE WATER INTAKE     Recommend to drink at least 6-8 8oz glasses of water per day.     lose weight     Pt states she would like to lose weight over the next year and would like to be <200 lbs.         This is a list of the screening recommended for you and due dates:  Health Maintenance  Topic Date Due   Zoster (Shingles) Vaccine (1 of 2) Never done   COVID-19 Vaccine (4 - 2023-24 season) 10/19/2021   Eye exam for diabetics  03/28/2022   Yearly kidney function blood test for diabetes  07/28/2022   Flu Shot  09/19/2022   Hemoglobin A1C  10/02/2022   Yearly kidney health urinalysis for diabetes  04/04/2023   Complete foot exam   07/31/2023   Medicare Annual Wellness Visit  08/01/2023   DTaP/Tdap/Td vaccine (2 - Td or Tdap) 07/02/2026   Pneumonia Vaccine  Completed   DEXA scan (bone density measurement)  Completed   HPV Vaccine  Aged Out   Hepatitis C Screening  Discontinued    Advanced directives: no  Conditions/risks identified: none  Next appointment: Follow up in one year for your annual wellness visit 08/07/23 @ 8:45 am by phone   Preventive Care 65 Years and Older, Female Preventive care refers to lifestyle choices and visits with your health care provider that can promote health and wellness. What does preventive care include? A yearly physical exam. This is also called an annual well check. Dental exams once or twice a year. Routine eye exams. Ask your health care provider how often you should have your eyes checked. Personal lifestyle choices, including: Daily care of your teeth and gums. Regular physical activity. Eating a  healthy diet. Avoiding tobacco and drug use. Limiting alcohol use. Practicing safe sex. Taking low-dose aspirin every day. Taking vitamin and mineral supplements as recommended by your health care provider. What happens during an annual well check? The services and screenings done by your health care provider during your annual well check will depend on your age, overall health, lifestyle risk factors, and family history of disease. Counseling  Your health care provider may ask you questions about your: Alcohol use. Tobacco use. Drug use. Emotional well-being. Home and relationship well-being. Sexual activity. Eating habits. History of falls. Memory and ability to understand (cognition). Work and work Astronomer. Reproductive health. Screening  You may have the following tests or measurements: Height, weight, and BMI. Blood pressure. Lipid and cholesterol levels. These may be checked every 5 years, or more frequently if you are over 72 years old. Skin check. Lung cancer screening. You may have this screening every year starting at age 34 if you have a 30-pack-year history of smoking and currently smoke or have quit within the past 15 years. Fecal occult blood test (FOBT) of the stool. You may have this test every year starting at age 21. Flexible sigmoidoscopy or colonoscopy. You may have a sigmoidoscopy every 5 years or a colonoscopy every 10 years starting at age 69. Hepatitis  C blood test. Hepatitis B blood test. Sexually transmitted disease (STD) testing. Diabetes screening. This is done by checking your blood sugar (glucose) after you have not eaten for a while (fasting). You may have this done every 1-3 years. Bone density scan. This is done to screen for osteoporosis. You may have this done starting at age 77. Mammogram. This may be done every 1-2 years. Talk to your health care provider about how often you should have regular mammograms. Talk with your health care  provider about your test results, treatment options, and if necessary, the need for more tests. Vaccines  Your health care provider may recommend certain vaccines, such as: Influenza vaccine. This is recommended every year. Tetanus, diphtheria, and acellular pertussis (Tdap, Td) vaccine. You may need a Td booster every 10 years. Zoster vaccine. You may need this after age 70. Pneumococcal 13-valent conjugate (PCV13) vaccine. One dose is recommended after age 15. Pneumococcal polysaccharide (PPSV23) vaccine. One dose is recommended after age 28. Talk to your health care provider about which screenings and vaccines you need and how often you need them. This information is not intended to replace advice given to you by your health care provider. Make sure you discuss any questions you have with your health care provider. Document Released: 03/03/2015 Document Revised: 10/25/2015 Document Reviewed: 12/06/2014 Elsevier Interactive Patient Education  2017 [employer] Prevention in the Home Falls can cause injuries. They can happen to people of all ages. There are many things you can do to make your home safe and to help prevent falls. What can I do on the outside of my home? Regularly fix the edges of walkways and driveways and fix any cracks. Remove anything that might make you trip as you walk through a door, such as a raised step or threshold. Trim any bushes or trees on the path to your home. Use bright outdoor lighting. Clear any walking paths of anything that might make someone trip, such as rocks or tools. Regularly check to see if handrails are loose or broken. Make sure that both sides of any steps have handrails. Any raised decks and porches should have guardrails on the edges. Have any leaves, snow, or ice cleared regularly. Use sand or salt on walking paths during winter. Clean up any spills in your garage right away. This includes oil or grease spills. What can I do in the  bathroom? Use night lights. Install grab bars by the toilet and in the tub and shower. Do not use towel bars as grab bars. Use non-skid mats or decals in the tub or shower. If you need to sit down in the shower, use a plastic, non-slip stool. Keep the floor dry. Clean up any water that spills on the floor as soon as it happens. Remove soap buildup in the tub or shower regularly. Attach bath mats securely with double-sided non-slip rug tape. Do not have throw rugs and other things on the floor that can make you trip. What can I do in the bedroom? Use night lights. Make sure that you have a light by your bed that is easy to reach. Do not use any sheets or blankets that are too big for your bed. They should not hang down onto the floor. Have a firm chair that has side arms. You can use this for support while you get dressed. Do not have throw rugs and other things on the floor that can make you trip. What can I do in the  kitchen? Clean up any spills right away. Avoid walking on wet floors. Keep items that you use a lot in easy-to-reach places. If you need to reach something above you, use a strong step stool that has a grab bar. Keep electrical cords out of the way. Do not use floor polish or wax that makes floors slippery. If you must use wax, use non-skid floor wax. Do not have throw rugs and other things on the floor that can make you trip. What can I do with my stairs? Do not leave any items on the stairs. Make sure that there are handrails on both sides of the stairs and use them. Fix handrails that are broken or loose. Make sure that handrails are as long as the stairways. Check any carpeting to make sure that it is firmly attached to the stairs. Fix any carpet that is loose or worn. Avoid having throw rugs at the top or bottom of the stairs. If you do have throw rugs, attach them to the floor with carpet tape. Make sure that you have a light switch at the top of the stairs and the  bottom of the stairs. If you do not have them, ask someone to add them for you. What else can I do to help prevent falls? Wear shoes that: Do not have high heels. Have rubber bottoms. Are comfortable and fit you well. Are closed at the toe. Do not wear sandals. If you use a stepladder: Make sure that it is fully opened. Do not climb a closed stepladder. Make sure that both sides of the stepladder are locked into place. Ask someone to hold it for you, if possible. Clearly mark and make sure that you can see: Any grab bars or handrails. First and last steps. Where the edge of each step is. Use tools that help you move around (mobility aids) if they are needed. These include: Canes. Walkers. Scooters. Crutches. Turn on the lights when you go into a dark area. Replace any light bulbs as soon as they burn out. Set up your furniture so you have a clear path. Avoid moving your furniture around. If any of your floors are uneven, fix them. If there are any pets around you, be aware of where they are. Review your medicines with your doctor. Some medicines can make you feel dizzy. This can increase your chance of falling. Ask your doctor what other things that you can do to help prevent falls. This information is not intended to replace advice given to you by your health care provider. Make sure you discuss any questions you have with your health care provider. Document Released: 12/01/2008 Document Revised: 07/13/2015 Document Reviewed: 03/11/2014 Elsevier Interactive Patient Education  2017 [employer].

## 2022-08-02 NOTE — Progress Notes (Signed)
PC to pt discussed labs voiced understanding.

## 2022-09-24 ENCOUNTER — Other Ambulatory Visit: Payer: Self-pay | Admitting: Internal Medicine

## 2022-10-09 ENCOUNTER — Other Ambulatory Visit: Payer: Self-pay | Admitting: Internal Medicine

## 2022-10-09 DIAGNOSIS — M2550 Pain in unspecified joint: Secondary | ICD-10-CM

## 2022-11-11 ENCOUNTER — Other Ambulatory Visit: Payer: Self-pay | Admitting: Internal Medicine

## 2022-11-11 DIAGNOSIS — F3341 Major depressive disorder, recurrent, in partial remission: Secondary | ICD-10-CM

## 2022-11-13 ENCOUNTER — Other Ambulatory Visit: Payer: Self-pay | Admitting: Internal Medicine

## 2022-11-13 DIAGNOSIS — E118 Type 2 diabetes mellitus with unspecified complications: Secondary | ICD-10-CM

## 2022-11-26 ENCOUNTER — Encounter: Payer: Self-pay | Admitting: Internal Medicine

## 2022-11-26 ENCOUNTER — Ambulatory Visit (INDEPENDENT_AMBULATORY_CARE_PROVIDER_SITE_OTHER): Payer: Medicare Other | Admitting: Internal Medicine

## 2022-11-26 VITALS — BP 126/76 | HR 76 | Ht 63.0 in | Wt 209.4 lb

## 2022-11-26 DIAGNOSIS — Z23 Encounter for immunization: Secondary | ICD-10-CM | POA: Diagnosis not present

## 2022-11-26 DIAGNOSIS — E118 Type 2 diabetes mellitus with unspecified complications: Secondary | ICD-10-CM

## 2022-11-26 DIAGNOSIS — I10 Essential (primary) hypertension: Secondary | ICD-10-CM | POA: Diagnosis not present

## 2022-11-26 DIAGNOSIS — I350 Nonrheumatic aortic (valve) stenosis: Secondary | ICD-10-CM | POA: Diagnosis not present

## 2022-11-26 DIAGNOSIS — M5136 Other intervertebral disc degeneration, lumbar region with discogenic back pain only: Secondary | ICD-10-CM

## 2022-11-26 DIAGNOSIS — Z7984 Long term (current) use of oral hypoglycemic drugs: Secondary | ICD-10-CM

## 2022-11-26 LAB — POCT GLYCOSYLATED HEMOGLOBIN (HGB A1C): Hemoglobin A1C: 6.8 % — AB (ref 4.0–5.6)

## 2022-11-26 NOTE — Progress Notes (Signed)
Date:  11/26/2022   Name:  Kimberly Walls   DOB:  1940/10/16   MRN:  [id number]   Chief Complaint: Hypertension and Diabetes  Hypertension This is a chronic problem. The problem is controlled. Associated symptoms include shortness of breath. Pertinent negatives include no chest pain, orthopnea, palpitations or peripheral edema. Past treatments include calcium channel blockers, ACE inhibitors and beta blockers.  Diabetes She presents for her follow-up diabetic visit. She has type 2 diabetes mellitus. Her disease course has been stable. Pertinent negatives for diabetes include no chest pain, no foot paresthesias, no foot ulcerations, no visual change and no weight loss. Current diabetic treatments: metformin and Jardiance. She is compliant with treatment all of the time.  Shortness of Breath This is a chronic problem. The problem occurs daily. The problem has been unchanged. Pertinent negatives include no chest pain, leg swelling, orthopnea, sputum production or wheezing. Exacerbated by: Aortic stenosis followed by Cardiology.  Back Pain This is a chronic problem. The problem has been gradually worsening since onset. The pain is present in the lumbar spine. The quality of the pain is described as aching and burning. The pain is moderate. Pertinent negatives include no chest pain or weight loss. She has tried NSAIDs (hx of laminectomy;) for the symptoms. The treatment provided mild relief.    Review of Systems  Constitutional:  Negative for weight loss.  Respiratory:  Positive for shortness of breath. Negative for sputum production and wheezing.   Cardiovascular:  Negative for chest pain, palpitations, orthopnea and leg swelling.  Musculoskeletal:  Positive for back pain.     Lab Results  Component Value Date   NA 138 07/31/2022   K 4.5 07/31/2022   CO2 23 07/31/2022   GLUCOSE 126 (H) 07/31/2022   BUN 24 07/31/2022   CREATININE 1.06 (H) 07/31/2022   CALCIUM 9.9 07/31/2022   EGFR 53 (L)  07/31/2022   GFRNONAA 62 11/16/2019   Lab Results  Component Value Date   CHOL 158 07/31/2022   HDL 42 07/31/2022   LDLCALC 76 07/31/2022   TRIG 244 (H) 07/31/2022   CHOLHDL 3.8 07/31/2022   Lab Results  Component Value Date   TSH 3.270 07/31/2022   Lab Results  Component Value Date   HGBA1C 6.8 (A) 11/26/2022   Lab Results  Component Value Date   WBC 8.3 07/31/2022   HGB 15.1 07/31/2022   HCT 46.0 07/31/2022   MCV 86 07/31/2022   PLT 246 07/31/2022   Lab Results  Component Value Date   ALT 16 07/31/2022   AST 30 07/31/2022   ALKPHOS 102 07/31/2022   BILITOT 0.4 07/31/2022   No results found for: "25OHVITD2", "25OHVITD3", "VD25OH"   Patient Active Problem List   Diagnosis Date Noted   Arthralgia 07/27/2021   Mild aortic stenosis 11/06/2020   Atypical chest pain 09/12/2020   BMI 38.0-38.9,adult 06/29/2015   History of pulmonary embolus (PE) 06/29/2015   DM type 2, controlled, with complication (HCC) 03/06/2015   Arthritis, degenerative 12/05/2014   DDD (degenerative disc disease), lumbar 08/29/2014   Hyperlipidemia associated with type 2 diabetes mellitus (HCC) 08/29/2014   Essential (primary) hypertension 08/29/2014   Dyspepsia 08/29/2014   Herpes 08/29/2014   Obstructive sleep apnea of adult 08/29/2014   Depression, major, recurrent, in partial remission (HCC) 08/29/2014   Headache, tension-type 08/29/2014   History of colon polyps 02/19/2008   Personal history of malignant neoplasm of breast 02/19/1991    Allergies  Allergen Reactions  Prochlorperazine    Pneumococcal Vaccine Rash   Tetanus Toxoids Rash    Past Surgical History:  Procedure Laterality Date   BUNIONECTOMY Bilateral    CARPAL TUNNEL RELEASE Left    LUMBAR LAMINECTOMY     MASTECTOMY MODIFIED RADICAL Bilateral 1993   PARTIAL HYSTERECTOMY     TOTAL KNEE ARTHROPLASTY Bilateral    TOTAL SHOULDER REPLACEMENT Bilateral     Social History   Tobacco Use   Smoking status: Never    Smokeless tobacco: Never  Vaping Use   Vaping status: Never Used  Substance Use Topics   Alcohol use: Not Currently   Drug use: No     Medication list has been reviewed and updated.  Current Meds  Medication Sig   amLODipine (NORVASC) 5 MG tablet Take 1 tablet (5 mg total) by mouth daily.   ascorbic acid (VITAMIN C) 250 MG tablet Take 250 mg by mouth daily.   aspirin 81 MG tablet Take 1 tablet by mouth daily.   atorvastatin (LIPITOR) 10 MG tablet TAKE ONE TABLET BY MOUTH ONCE DAILY   Biotin 2500 MCG CAPS Take 5,000 mcg by mouth daily.   Calcium Carb-Cholecalciferol (CALCIUM + D3) 600-200 MG-UNIT TABS Take 1 tablet by mouth daily.   carvedilol (COREG) 6.25 MG tablet Take 6.25 mg by mouth 2 (two) times daily.   cetirizine (ZYRTEC) 5 MG tablet Take 5 mg by mouth daily.   Cinnamon 500 MG TABS Take 2 tablets by mouth daily.   Coenzyme Q10 (COQ10) 200 MG CAPS Take 1 capsule by mouth daily.   desloratadine (CLARINEX) 5 MG tablet Take 1 tablet (5 mg total) by mouth daily.   Elderberry 500 MG CAPS Take by mouth.   escitalopram (LEXAPRO) 10 MG tablet TAKE ONE TABLET BY MOUTH ONCE DAILY   glucose blood test strip Use to test BS twice a day   hydroquinone 4 % cream Apply topically daily.   JARDIANCE 10 MG TABS tablet TAKE ONE TABLET BY MOUTH ONCE DAILY   lisinopril (ZESTRIL) 40 MG tablet Take 1 tablet (40 mg total) by mouth daily.   metFORMIN (GLUCOPHAGE-XR) 500 MG 24 hr tablet TAKE ONE TABLET BY MOUTH TWICE DAILY   Multiple Vitamin (MULTIVITAMIN) capsule Take 1 capsule by mouth daily.   nystatin cream (MYCOSTATIN) Apply topically 2 (two) times daily. For rash under breast and abdomen   omeprazole (PRILOSEC) 20 MG capsule TAKE ONE CAPSULE BY MOUTH ONCE DAILY   oxaprozin (DAYPRO) 600 MG tablet TAKE ONE TABLET BY MOUTH TWICE DAILY   Probiotic Product (PROBIOTIC PO) Take by mouth.   zinc gluconate 50 MG tablet Take 50 mg by mouth daily.   [DISCONTINUED] hydrochlorothiazide (HYDRODIURIL) 25 MG  tablet Take 25 mg by mouth daily.       11/26/2022   10:46 AM 07/31/2022    9:47 AM 04/03/2022   10:36 AM 11/28/2021   10:41 AM  GAD 7 : Generalized Anxiety Score  Nervous, Anxious, on Edge 2 1 1 1   Control/stop worrying 2 1 0 1  Worry too much - different things 2 1 1 1   Trouble relaxing 2 0 0 1  Restless 2 0 1 1  Easily annoyed or irritable 1 0 0 1  Afraid - awful might happen 1 0 0 0  Total GAD 7 Score 12 3 3 6   Anxiety Difficulty Somewhat difficult Somewhat difficult Not difficult at all Not difficult at all       11/26/2022   10:45 AM 08/01/2022  11:04 AM 07/31/2022    9:47 AM  Depression screen PHQ 2/9  Decreased Interest 1 1 0  Down, Depressed, Hopeless 2 1 1   PHQ - 2 Score 3 2 1   Altered sleeping 3 0 2  Tired, decreased energy 3 1 1   Change in appetite 1 0 1  Feeling bad or failure about yourself  2 0 0  Trouble concentrating 1 0 1  Moving slowly or fidgety/restless 2 0 0  Suicidal thoughts 0 0 0  PHQ-9 Score 15 3 6   Difficult doing work/chores Somewhat difficult Not difficult at all Somewhat difficult    BP Readings from Last 3 Encounters:  11/26/22 126/76  07/31/22 126/72  04/03/22 122/78    Physical Exam Vitals and nursing note reviewed.  Constitutional:      General: She is not in acute distress.    Appearance: Normal appearance. She is well-developed.  HENT:     Head: Normocephalic and atraumatic.  Neck:     Vascular: No carotid bruit.  Cardiovascular:     Rate and Rhythm: Normal rate and regular rhythm.     Heart sounds: No murmur heard. Pulmonary:     Effort: Pulmonary effort is normal. No respiratory distress.     Breath sounds: No wheezing or rhonchi.  Musculoskeletal:     Cervical back: Normal range of motion.     Right lower leg: No edema.     Left lower leg: No edema.  Lymphadenopathy:     Cervical: No cervical adenopathy.  Skin:    General: Skin is warm and dry.     Findings: No rash.  Neurological:     Mental Status: She is  alert and oriented to person, place, and time.     Gait: Gait abnormal.  Psychiatric:        Mood and Affect: Mood normal.        Behavior: Behavior normal.     Wt Readings from Last 3 Encounters:  11/26/22 209 lb 6.4 oz (95 kg)  08/01/22 204 lb (92.5 kg)  07/31/22 204 lb (92.5 kg)    BP 126/76 (BP Location: Right Arm, Cuff Size: Large)   Pulse 76   Ht 5\' 3"  (1.6 m)   Wt 209 lb 6.4 oz (95 kg)   SpO2 94%   BMI 37.09 kg/m   Assessment and Plan:  Problem List Items Addressed This Visit       Unprioritized   DDD (degenerative disc disease), lumbar    Low back generally worsening s/p laminectomy Taking nsiads daily; using topical patches Has a walker but not using it.      DM type 2, controlled, with complication (HCC) (Chronic)    Blood sugars stable without hypoglycemic symptoms or events. Current regimen is Jardiance and metformin. Changes made last visit are none. Lab Results  Component Value Date   HGBA1C 6.9 (H) 07/31/2022  A1C today = 6.8 Will request eye exam       Relevant Orders   POCT glycosylated hemoglobin (Hb A1C) (Completed)   Essential (primary) hypertension - Primary (Chronic)    Normal exam with stable BP. No concerns or side effects to current medication. No change in regimen; continue low sodium diet.       Mild aortic stenosis (Chronic)    Mild DOE unchanged. Last seen by Cardiology in November 2023 Has follow up ECHO next month      Other Visit Diagnoses     Long term current use of oral hypoglycemic  drug       Need for influenza vaccination       Relevant Orders   Flu Vaccine Trivalent High Dose (Fluad) (Completed)       Return in about 4 months (around 03/29/2023) for DM, HTN.    Reubin Milan, MD [facility] [place]

## 2022-11-26 NOTE — Assessment & Plan Note (Addendum)
Blood sugars stable without hypoglycemic symptoms or events. Current regimen is Jardiance and metformin. Changes made last visit are none. Lab Results  Component Value Date   HGBA1C 6.9 (H) 07/31/2022  A1C today = 6.8 Will request eye exam

## 2022-11-26 NOTE — Assessment & Plan Note (Signed)
Low back generally worsening s/p laminectomy Taking nsiads daily; using topical patches Has a walker but not using it.

## 2022-11-26 NOTE — Assessment & Plan Note (Signed)
Normal exam with stable BP. No concerns or side effects to current medication. No change in regimen; continue low sodium diet.

## 2022-11-26 NOTE — Assessment & Plan Note (Addendum)
Mild DOE unchanged. Last seen by Cardiology in November 2023 Has follow up ECHO next month

## 2022-12-30 ENCOUNTER — Other Ambulatory Visit: Payer: Self-pay | Admitting: Internal Medicine

## 2022-12-30 DIAGNOSIS — M2550 Pain in unspecified joint: Secondary | ICD-10-CM

## 2022-12-31 NOTE — Telephone Encounter (Signed)
Requested Prescriptions  Pending Prescriptions Disp Refills   oxaprozin (DAYPRO) 600 MG tablet [Pharmacy Med Name: Oxaprozin Oral Tablet 600 MG] 180 tablet 0    Sig: TAKE ONE TABLET BY MOUTH TWICE DAILY     Analgesics:  NSAIDS Failed - 12/30/2022  3:31 PM      Failed - Manual Review: Labs are only required if the patient has taken medication for more than 8 weeks.      Failed - Cr in normal range and within 360 days    Creatinine, Ser  Date Value Ref Range Status  07/31/2022 1.06 (H) 0.57 - 1.00 mg/dL Final         Passed - HGB in normal range and within 360 days    Hemoglobin  Date Value Ref Range Status  07/31/2022 15.1 11.1 - 15.9 g/dL Final         Passed - PLT in normal range and within 360 days    Platelets  Date Value Ref Range Status  07/31/2022 246 150 - 450 x10E3/uL Final         Passed - HCT in normal range and within 360 days    Hematocrit  Date Value Ref Range Status  07/31/2022 46.0 34.0 - 46.6 % Final         Passed - eGFR is 30 or above and within 360 days    GFR calc Af Amer  Date Value Ref Range Status  11/16/2019 72 >59 mL/min/1.73 Final    Comment:    **[facility] currently reports eGFR in compliance with the current**   recommendations of the [employer]. [facility] will   update reporting as new guidelines are published from the NKF-ASN   Task force.    GFR calc non Af Amer  Date Value Ref Range Status  11/16/2019 62 >59 mL/min/1.73 Final   eGFR  Date Value Ref Range Status  07/31/2022 53 (L) >59 mL/min/1.73 Final         Passed - Patient is not pregnant      Passed - Valid encounter within last 12 months    Recent Outpatient Visits           1 month ago Essential (primary) hypertension   [place] [facility] at [facility], Nyoka Cowden, MD   5 months ago Essential (primary) hypertension   [place] [facility] at [facility], Nyoka Cowden, MD   9 months ago DM  type 2, controlled, with complication [facility])   [place] [facility] at [facility], Nyoka Cowden, MD   1 year ago DM type 2, controlled, with complication [facility])   [place] [facility] at [facility], Nyoka Cowden, MD   1 year ago Essential (primary) hypertension    Primary Care & Sports Medicine at [facility], Nyoka Cowden, MD       Future Appointments             In 3 months Judithann Graves, Nyoka Cowden, MD [facility] at [facility], [facility]   In 7 months Judithann Graves, Nyoka Cowden, MD [facility] at [facility], [facility]

## 2023-03-04 DIAGNOSIS — K080 Exfoliation of teeth due to systemic causes: Secondary | ICD-10-CM | POA: Diagnosis not present

## 2023-03-14 DIAGNOSIS — D225 Melanocytic nevi of trunk: Secondary | ICD-10-CM | POA: Diagnosis not present

## 2023-03-14 DIAGNOSIS — L821 Other seborrheic keratosis: Secondary | ICD-10-CM | POA: Diagnosis not present

## 2023-03-14 DIAGNOSIS — L720 Epidermal cyst: Secondary | ICD-10-CM | POA: Diagnosis not present

## 2023-03-14 DIAGNOSIS — L570 Actinic keratosis: Secondary | ICD-10-CM | POA: Diagnosis not present

## 2023-03-14 DIAGNOSIS — L820 Inflamed seborrheic keratosis: Secondary | ICD-10-CM | POA: Diagnosis not present

## 2023-03-14 DIAGNOSIS — Z85828 Personal history of other malignant neoplasm of skin: Secondary | ICD-10-CM | POA: Diagnosis not present

## 2023-03-31 ENCOUNTER — Encounter: Payer: Self-pay | Admitting: Internal Medicine

## 2023-03-31 ENCOUNTER — Ambulatory Visit: Payer: Medicare Other | Admitting: Internal Medicine

## 2023-03-31 VITALS — BP 122/78 | HR 101 | Resp 94 | Ht 63.0 in | Wt 203.0 lb

## 2023-03-31 DIAGNOSIS — E1169 Type 2 diabetes mellitus with other specified complication: Secondary | ICD-10-CM

## 2023-03-31 DIAGNOSIS — H9313 Tinnitus, bilateral: Secondary | ICD-10-CM

## 2023-03-31 DIAGNOSIS — I10 Essential (primary) hypertension: Secondary | ICD-10-CM

## 2023-03-31 DIAGNOSIS — Z7984 Long term (current) use of oral hypoglycemic drugs: Secondary | ICD-10-CM

## 2023-03-31 DIAGNOSIS — F3341 Major depressive disorder, recurrent, in partial remission: Secondary | ICD-10-CM

## 2023-03-31 DIAGNOSIS — E118 Type 2 diabetes mellitus with unspecified complications: Secondary | ICD-10-CM

## 2023-03-31 DIAGNOSIS — I350 Nonrheumatic aortic (valve) stenosis: Secondary | ICD-10-CM

## 2023-03-31 DIAGNOSIS — E785 Hyperlipidemia, unspecified: Secondary | ICD-10-CM

## 2023-03-31 MED ORDER — AMLODIPINE BESYLATE 5 MG PO TABS: 5.0000 mg | ORAL_TABLET | Freq: Every day | ORAL | 3 refills | Status: AC

## 2023-03-31 NOTE — Assessment & Plan Note (Signed)
 On atorvastatin  without SE.

## 2023-03-31 NOTE — Assessment & Plan Note (Signed)
 Controlled BP with normal exam. Current regimen is lisinopril , amlodipine  and Coreg. Will continue same medications; encourage continued reduced sodium diet.

## 2023-03-31 NOTE — Assessment & Plan Note (Signed)
 Recommend ENT evaluation with Dr. Metta Actis

## 2023-03-31 NOTE — Assessment & Plan Note (Signed)
 Clinically stable on Lexapro with good response, No SI or HI reported. No change in management at this time.

## 2023-03-31 NOTE — Assessment & Plan Note (Signed)
 Compared with prior Echo study on   10/19/2020 : AORTIC STENOSIS HAS PROGRESSED SLIGHTLY (DI 0.37, VMAX 2.5 M/SEC)

## 2023-03-31 NOTE — Assessment & Plan Note (Signed)
 Blood sugars stable without hypoglycemic symptoms or events. Currently managed with Jardiance  and MTF. Changes made last visit are none. Lab Results  Component Value Date   HGBA1C 6.8 (A) 11/26/2022

## 2023-03-31 NOTE — Patient Instructions (Signed)
 Call Dr. Arlena Lacrosse at [facility] to be seen for tinnitus and dizziness.

## 2023-03-31 NOTE — Progress Notes (Signed)
 Date:  03/31/2023   Name:  Kimberly Walls   DOB:  [birth year]   MRN:  [id number]   Chief Complaint: Diabetes and Hypertension  Hypertension This is a chronic problem. The problem is controlled. Pertinent negatives include no chest pain, headaches, palpitations or shortness of breath.  Diabetes She presents for her follow-up diabetic visit. She has type 2 diabetes mellitus. Hypoglycemia symptoms include dizziness. Pertinent negatives for hypoglycemia include no headaches or tremors. Pertinent negatives for diabetes include no chest pain, no fatigue, no polydipsia and no polyuria. Current diabetic treatment includes oral agent (dual therapy) (Jardiance  and MTF).  Dizziness This is a new problem. The current episode started more than 1 month ago. The problem occurs daily. The problem has been unchanged. Pertinent negatives include no abdominal pain, arthralgias, chest pain, coughing, fatigue, fever, headaches or numbness. The symptoms are aggravated by twisting and bending. She has tried nothing for the symptoms.    Review of Systems  Constitutional:  Negative for appetite change, fatigue, fever and unexpected weight change.  HENT:  Positive for tinnitus. Negative for trouble swallowing.   Eyes:  Negative for visual disturbance.  Respiratory:  Negative for cough, chest tightness and shortness of breath.   Cardiovascular:  Negative for chest pain, palpitations and leg swelling.  Gastrointestinal:  Negative for abdominal pain.  Endocrine: Negative for polydipsia and polyuria.  Genitourinary:  Negative for dysuria and hematuria.  Musculoskeletal:  Negative for arthralgias.  Neurological:  Positive for dizziness. Negative for tremors, numbness and headaches.  Psychiatric/Behavioral:  Negative for dysphoric mood.      Lab Results  Component Value Date   NA 138 07/31/2022   K 4.5 07/31/2022   CO2 23 07/31/2022   GLUCOSE 126 (H) 07/31/2022   BUN 24 07/31/2022   CREATININE 1.06 (H)  07/31/2022   CALCIUM  9.9 07/31/2022   EGFR 53 (L) 07/31/2022   GFRNONAA 62 11/16/2019   Lab Results  Component Value Date   CHOL 158 07/31/2022   HDL 42 07/31/2022   LDLCALC 76 07/31/2022   TRIG 244 (H) 07/31/2022   CHOLHDL 3.8 07/31/2022   Lab Results  Component Value Date   TSH 3.270 07/31/2022   Lab Results  Component Value Date   HGBA1C 6.8 (A) 11/26/2022   Lab Results  Component Value Date   WBC 8.3 07/31/2022   HGB 15.1 07/31/2022   HCT 46.0 07/31/2022   MCV 86 07/31/2022   PLT 246 07/31/2022   Lab Results  Component Value Date   ALT 16 07/31/2022   AST 30 07/31/2022   ALKPHOS 102 07/31/2022   BILITOT 0.4 07/31/2022   No results found for: "25OHVITD2", "25OHVITD3", "VD25OH"   Patient Active Problem List   Diagnosis Date Noted   Obesity, morbid (HCC) 03/31/2023   Tinnitus aurium, bilateral 03/31/2023   Arthralgia 07/27/2021   Aortic stenosis 11/06/2020   Atypical chest pain 09/12/2020   BMI 38.0-38.9,adult 06/29/2015   History of pulmonary embolus (PE) 06/29/2015   DM type 2, controlled, with complication (HCC) 03/06/2015   Arthritis, degenerative 12/05/2014   DDD (degenerative disc disease), lumbar 08/29/2014   Hyperlipidemia associated with type 2 diabetes mellitus (HCC) 08/29/2014   Essential (primary) hypertension 08/29/2014   Dyspepsia 08/29/2014   Herpes 08/29/2014   Obstructive sleep apnea of adult 08/29/2014   Depression, major, recurrent, in partial remission (HCC) 08/29/2014   Headache, tension-type 08/29/2014   History of colon polyps 02/19/2008   Personal history of malignant neoplasm of breast 02/19/1991  Allergies  Allergen Reactions   Prochlorperazine    Pneumococcal Vaccine Rash   Tetanus Toxoids Rash    Past Surgical History:  Procedure Laterality Date   BUNIONECTOMY Bilateral    CARPAL TUNNEL RELEASE Left    LUMBAR LAMINECTOMY     MASTECTOMY MODIFIED RADICAL Bilateral 1993   PARTIAL HYSTERECTOMY     TOTAL KNEE  ARTHROPLASTY Bilateral    TOTAL SHOULDER REPLACEMENT Bilateral     Social History   Tobacco Use   Smoking status: Never   Smokeless tobacco: Never  Vaping Use   Vaping status: Never Used  Substance Use Topics   Alcohol use: Not Currently   Drug use: No     Medication list has been reviewed and updated.  Current Meds  Medication Sig   ascorbic acid (VITAMIN C) 250 MG tablet Take 250 mg by mouth daily.   aspirin 81 MG tablet Take 1 tablet by mouth daily.   atorvastatin  (LIPITOR) 10 MG tablet TAKE ONE TABLET BY MOUTH ONCE DAILY   Biotin 2500 MCG CAPS Take 5,000 mcg by mouth daily.   Calcium  Carb-Cholecalciferol (CALCIUM  + D3) 600-200 MG-UNIT TABS Take 1 tablet by mouth daily.   carvedilol (COREG) 6.25 MG tablet Take 6.25 mg by mouth 2 (two) times daily.   cetirizine (ZYRTEC) 5 MG tablet Take 5 mg by mouth daily.   Cinnamon 500 MG TABS Take 2 tablets by mouth daily.   Coenzyme Q10 (COQ10) 200 MG CAPS Take 1 capsule by mouth daily.   desloratadine  (CLARINEX ) 5 MG tablet Take 1 tablet (5 mg total) by mouth daily.   Elderberry 500 MG CAPS Take by mouth.   escitalopram  (LEXAPRO ) 10 MG tablet TAKE ONE TABLET BY MOUTH ONCE DAILY   glucose blood test strip Use to test BS twice a day   hydroquinone 4 % cream Apply topically daily.   JARDIANCE  10 MG TABS tablet TAKE ONE TABLET BY MOUTH ONCE DAILY   lisinopril  (ZESTRIL ) 40 MG tablet Take 1 tablet (40 mg total) by mouth daily.   metFORMIN  (GLUCOPHAGE -XR) 500 MG 24 hr tablet TAKE ONE TABLET BY MOUTH TWICE DAILY   Multiple Vitamin (MULTIVITAMIN) capsule Take 1 capsule by mouth daily.   nystatin  cream (MYCOSTATIN ) Apply topically 2 (two) times daily. For rash under breast and abdomen   omeprazole  (PRILOSEC) 20 MG capsule TAKE ONE CAPSULE BY MOUTH ONCE DAILY   oxaprozin  (DAYPRO ) 600 MG tablet TAKE ONE TABLET BY MOUTH TWICE DAILY   Probiotic Product (PROBIOTIC PO) Take by mouth.   zinc gluconate 50 MG tablet Take 50 mg by mouth daily.    [DISCONTINUED] amLODipine  (NORVASC ) 5 MG tablet Take 1 tablet (5 mg total) by mouth daily.       03/31/2023    1:18 PM 11/26/2022   10:46 AM 07/31/2022    9:47 AM 04/03/2022   10:36 AM  GAD 7 : Generalized Anxiety Score  Nervous, Anxious, on Edge 0 2 1 1   Control/stop worrying 0 2 1 0  Worry too much - different things 1 2 1 1   Trouble relaxing  2 0 0  Restless 0 2 0 1  Easily annoyed or irritable 0 1 0 0  Afraid - awful might happen 0 1 0 0  Total GAD 7 Score  12 3 3   Anxiety Difficulty Not difficult at all Somewhat difficult Somewhat difficult Not difficult at all       03/31/2023    1:18 PM 11/26/2022   10:45 AM 08/01/2022  11:04 AM  Depression screen PHQ 2/9  Decreased Interest 0 1 1  Down, Depressed, Hopeless 1 2 1   PHQ - 2 Score 1 3 2   Altered sleeping 2 3 0  Tired, decreased energy 1 3 1   Change in appetite 1 1 0  Feeling bad or failure about yourself  0 2 0  Trouble concentrating 1 1 0  Moving slowly or fidgety/restless 0 2 0  Suicidal thoughts 0 0 0  PHQ-9 Score 6 15 3   Difficult doing work/chores Not difficult at all Somewhat difficult Not difficult at all    BP Readings from Last 3 Encounters:  03/31/23 122/78  11/26/22 126/76  07/31/22 126/72    Physical Exam Vitals and nursing note reviewed.  Constitutional:      General: She is not in acute distress.    Appearance: Normal appearance. She is well-developed.  HENT:     Head: Normocephalic and atraumatic.     Right Ear: Tympanic membrane and ear canal normal.     Left Ear: Tympanic membrane and ear canal normal.     Nose:     Right Sinus: No maxillary sinus tenderness.     Left Sinus: No maxillary sinus tenderness.  Cardiovascular:     Rate and Rhythm: Normal rate and regular rhythm.  Pulmonary:     Effort: Pulmonary effort is normal. No respiratory distress.     Breath sounds: No wheezing or rhonchi.  Musculoskeletal:     Cervical back: Normal range of motion.     Right lower leg: No edema.      Left lower leg: No edema.  Skin:    General: Skin is warm and dry.     Findings: No rash.  Neurological:     General: No focal deficit present.     Mental Status: She is alert and oriented to person, place, and time.  Psychiatric:        Mood and Affect: Mood normal.        Behavior: Behavior normal.     Wt Readings from Last 3 Encounters:  03/31/23 203 lb (92.1 kg)  11/26/22 209 lb 6.4 oz (95 kg)  08/01/22 204 lb (92.5 kg)    BP 122/78   Pulse (!) 101   Resp (!) 94   Ht 5\' 3"  (1.6 m)   Wt 203 lb (92.1 kg)   BMI 35.96 kg/m   Assessment and Plan:  Problem List Items Addressed This Visit       Unprioritized   Hyperlipidemia associated with type 2 diabetes mellitus (HCC) (Chronic)   On atorvastatin  without SE.      Relevant Medications   amLODipine  (NORVASC ) 5 MG tablet   Essential (primary) hypertension - Primary (Chronic)   Controlled BP with normal exam. Current regimen is lisinopril , amlodipine  and Coreg. Will continue same medications; encourage continued reduced sodium diet.       Relevant Medications   amLODipine  (NORVASC ) 5 MG tablet   Other Relevant Orders   Basic metabolic panel   Depression, major, recurrent, in partial remission (HCC) (Chronic)   Clinically stable on Lexapro  with good response, No SI or HI reported. No change in management at this time.       DM type 2, controlled, with complication (HCC) (Chronic)   Blood sugars stable without hypoglycemic symptoms or events. Currently managed with Jardiance  and MTF. Changes made last visit are none. Lab Results  Component Value Date   HGBA1C 6.8 (A) 11/26/2022  Relevant Orders   Hemoglobin A1c   Aortic stenosis   Compared with prior Echo study on   10/19/2020 : AORTIC STENOSIS HAS PROGRESSED SLIGHTLY (DI 0.37, VMAX 2.5 M/SEC)       Relevant Medications   amLODipine  (NORVASC ) 5 MG tablet   Obesity, morbid (HCC)   Tinnitus aurium, bilateral   Recommend ENT evaluation with Dr.  Metta Actis      Other Visit Diagnoses       Long term current use of oral hypoglycemic drug           Return in about 4 months (around 07/29/2023) for CPX.    Sheron Dixons, MD [facility] [place]

## 2023-04-01 ENCOUNTER — Encounter: Payer: Self-pay | Admitting: Internal Medicine

## 2023-04-01 LAB — BASIC METABOLIC PANEL
BUN/Creatinine Ratio: 20 (ref 12–28)
BUN: 20 mg/dL (ref 8–27)
CO2: 22 mmol/L (ref 20–29)
Calcium: 9.7 mg/dL (ref 8.7–10.3)
Chloride: 101 mmol/L (ref 96–106)
Creatinine, Ser: 0.98 mg/dL (ref 0.57–1.00)
Glucose: 131 mg/dL — ABNORMAL HIGH (ref 70–99)
Potassium: 4.4 mmol/L (ref 3.5–5.2)
Sodium: 141 mmol/L (ref 134–144)
eGFR: 58 mL/min/{1.73_m2} — ABNORMAL LOW (ref >59)

## 2023-04-01 LAB — HEMOGLOBIN A1C
Est. average glucose Bld gHb Est-mCnc: 154 mg/dL
Hgb A1c MFr Bld: 7 % — ABNORMAL HIGH (ref 4.8–5.6)

## 2023-04-07 ENCOUNTER — Other Ambulatory Visit: Payer: Self-pay | Admitting: Internal Medicine

## 2023-04-07 NOTE — Telephone Encounter (Signed)
Medication Refill -  Most Recent Primary Care Visit:  Provider: Reubin Milan  Department: [facility]  Visit Type: OFFICE VISIT  Date: 11/26/2022  Medication: metFORMIN (GLUCOPHAGE-XR) 500 MG 24 hr tablet  omeprazole (PRILOSEC) 20 MG capsule    Has the patient contacted their pharmacy? Yes [facility] is calling in for there prescription refill.   Is this the correct pharmacy for this prescription? Yes  This is the patient's preferred pharmacy:    [facility] [place] - [address] [place] [address] Phone: [phone] Fax: [phone]   Has the prescription been filled recently? No  Is the patient out of the medication? Yes  Has the patient been seen for an appointment in the last year OR does the patient have an upcoming appointment? Yes  Can we respond through MyChart? No  Agent: Please be advised that Rx refills may take up to 3 business days. We ask that you follow-up with your pharmacy.

## 2023-04-08 MED ORDER — METFORMIN HCL ER 500 MG PO TB24: 500.0000 mg | ORAL_TABLET | Freq: Two times a day (BID) | ORAL | 1 refills | Status: DC

## 2023-04-08 MED ORDER — OMEPRAZOLE 20 MG PO CPDR: 20.0000 mg | DELAYED_RELEASE_CAPSULE | Freq: Every day | ORAL | 1 refills | Status: DC

## 2023-04-08 NOTE — Telephone Encounter (Signed)
Requested Prescriptions  Pending Prescriptions Disp Refills   metFORMIN (GLUCOPHAGE-XR) 500 MG 24 hr tablet 180 tablet 1    Sig: Take 1 tablet (500 mg total) by mouth 2 (two) times daily.     Endocrinology:  Diabetes - Biguanides Failed - 04/08/2023  1:15 PM      Failed - eGFR in normal range and within 360 days    GFR calc Af Amer  Date Value Ref Range Status  11/16/2019 72 >59 mL/min/1.73 Final    Comment:    **[facility] currently reports eGFR in compliance with the current**   recommendations of the [employer]. [facility] will   update reporting as new guidelines are published from the NKF-ASN   Task force.    GFR calc non Af Amer  Date Value Ref Range Status  11/16/2019 62 >59 mL/min/1.73 Final   eGFR  Date Value Ref Range Status  03/31/2023 58 (L) >59 mL/min/1.73 Final         Failed - B12 Level in normal range and within 720 days    No results found for: "VITAMINB12"       Passed - Cr in normal range and within 360 days    Creatinine, Ser  Date Value Ref Range Status  03/31/2023 0.98 0.57 - 1.00 mg/dL Final         Passed - HBA1C is between 0 and 7.9 and within 180 days    Hgb A1c MFr Bld  Date Value Ref Range Status  03/31/2023 7.0 (H) 4.8 - 5.6 % Final    Comment:             Prediabetes: 5.7 - 6.4          Diabetes: >6.4          Glycemic control for adults with diabetes: <7.0          Passed - Valid encounter within last 6 months    Recent Outpatient Visits           4 months ago Essential (primary) hypertension   [place] [facility] at [facility], Nyoka Cowden, MD   8 months ago Essential (primary) hypertension   [place] [facility] at [facility], Nyoka Cowden, MD   1 year ago DM type 2, controlled, with complication [facility])   [place] [facility] at [facility], Nyoka Cowden, MD   1 year ago DM type 2, controlled, with complication [facility])   [facility] at [facility], Nyoka Cowden, MD   1 year ago Essential (primary) hypertension   [place] [facility] at [facility], Nyoka Cowden, MD       Future Appointments             In 3 months Judithann Graves Nyoka Cowden, MD [facility] at [facility], [facility]            Passed - CBC within normal limits and completed in the last 12 months    WBC  Date Value Ref Range Status  07/31/2022 8.3 3.4 - 10.8 x10E3/uL Final   RBC  Date Value Ref Range Status  07/31/2022 5.37 (H) 3.77 - 5.28 x10E6/uL Final   Hemoglobin  Date Value Ref Range Status  07/31/2022 15.1 11.1 - 15.9 g/dL Final   Hematocrit  Date Value Ref Range Status  07/31/2022 46.0  34.0 - 46.6 % Final   MCHC  Date Value Ref Range Status  07/31/2022 32.8 31.5 - 35.7 g/dL Final   [facility]  Date Value Ref Range Status  07/31/2022 28.1 26.6 - 33.0 pg Final   MCV  Date Value Ref Range Status  07/31/2022 86 79 - 97 fL Final   No results found for: "PLTCOUNTKUC", "LABPLAT", "POCPLA" RDW  Date Value Ref Range Status  07/31/2022 14.6 11.7 - 15.4 % Final          omeprazole (PRILOSEC) 20 MG capsule 90 capsule 1    Sig: Take 1 capsule (20 mg total) by mouth daily.     Gastroenterology: Proton Pump Inhibitors Passed - 04/08/2023  1:15 PM      Passed - Valid encounter within last 12 months    Recent Outpatient Visits           4 months ago Essential (primary) hypertension   [place] [facility] at [facility], Nyoka Cowden, MD   8 months ago Essential (primary) hypertension   [place] [facility] at [facility], Nyoka Cowden, MD   1 year ago DM type 2, controlled, with complication [facility])   [place] [facility] at [facility], Nyoka Cowden, MD   1 year ago DM type 2, controlled, with complication [facility])   [place] [facility] &  Sports Medicine at [facility], Nyoka Cowden, MD   1 year ago Essential (primary) hypertension    Primary Care & Sports Medicine at [facility], Nyoka Cowden, MD       Future Appointments             In 3 months Judithann Graves, Nyoka Cowden, MD [facility] & [facility] Medicine at [facility], [facility]

## 2023-04-25 ENCOUNTER — Other Ambulatory Visit: Payer: Self-pay | Admitting: Internal Medicine

## 2023-04-25 DIAGNOSIS — N1831 Chronic kidney disease, stage 3a: Secondary | ICD-10-CM | POA: Insufficient documentation

## 2023-04-25 NOTE — Telephone Encounter (Signed)
 Please review.  KP

## 2023-04-25 NOTE — Progress Notes (Unsigned)
 Date:  04/25/2023   Name:  Kimberly Walls   DOB:  June 14, 1940   MRN:  [id number]   Chief Complaint: No chief complaint on file.  HPI  Review of Systems   Lab Results  Component Value Date   NA 141 03/31/2023   K 4.4 03/31/2023   CO2 22 03/31/2023   GLUCOSE 131 (H) 03/31/2023   BUN 20 03/31/2023   CREATININE 0.98 03/31/2023   CALCIUM 9.7 03/31/2023   EGFR 58 (L) 03/31/2023   GFRNONAA 62 11/16/2019   Lab Results  Component Value Date   CHOL 158 07/31/2022   HDL 42 07/31/2022   LDLCALC 76 07/31/2022   TRIG 244 (H) 07/31/2022   CHOLHDL 3.8 07/31/2022   Lab Results  Component Value Date   TSH 3.270 07/31/2022   Lab Results  Component Value Date   HGBA1C 7.0 (H) 03/31/2023   Lab Results  Component Value Date   WBC 8.3 07/31/2022   HGB 15.1 07/31/2022   HCT 46.0 07/31/2022   MCV 86 07/31/2022   PLT 246 07/31/2022   Lab Results  Component Value Date   ALT 16 07/31/2022   AST 30 07/31/2022   ALKPHOS 102 07/31/2022   BILITOT 0.4 07/31/2022   No results found for: "25OHVITD2", "25OHVITD3", "VD25OH"   Patient Active Problem List   Diagnosis Date Noted   Obesity, morbid (HCC) 03/31/2023   Tinnitus aurium, bilateral 03/31/2023   Arthralgia 07/27/2021   Aortic stenosis 11/06/2020   Atypical chest pain 09/12/2020   BMI 38.0-38.9,adult 06/29/2015   History of pulmonary embolus (PE) 06/29/2015   DM type 2, controlled, with complication (HCC) 03/06/2015   Arthritis, degenerative 12/05/2014   DDD (degenerative disc disease), lumbar 08/29/2014   Hyperlipidemia associated with type 2 diabetes mellitus (HCC) 08/29/2014   Essential (primary) hypertension 08/29/2014   Dyspepsia 08/29/2014   Herpes 08/29/2014   Obstructive sleep apnea of adult 08/29/2014   Depression, major, recurrent, in partial remission (HCC) 08/29/2014   Headache, tension-type 08/29/2014   History of colon polyps 02/19/2008   Personal history of malignant neoplasm of breast 02/19/1991     Allergies  Allergen Reactions   Prochlorperazine    Pneumococcal Vaccine Rash   Tetanus Toxoids Rash    Past Surgical History:  Procedure Laterality Date   BUNIONECTOMY Bilateral    CARPAL TUNNEL RELEASE Left    LUMBAR LAMINECTOMY     MASTECTOMY MODIFIED RADICAL Bilateral 1993   PARTIAL HYSTERECTOMY     TOTAL KNEE ARTHROPLASTY Bilateral    TOTAL SHOULDER REPLACEMENT Bilateral     Social History   Tobacco Use   Smoking status: Never   Smokeless tobacco: Never  Vaping Use   Vaping status: Never Used  Substance Use Topics   Alcohol use: Not Currently   Drug use: No     Medication list has been reviewed and updated.  No outpatient medications have been marked as taking for the 04/25/23 encounter (Orders Only) with Reubin Milan, MD.       03/31/2023    1:18 PM 11/26/2022   10:46 AM 07/31/2022    9:47 AM 04/03/2022   10:36 AM  GAD 7 : Generalized Anxiety Score  Nervous, Anxious, on Edge 0 2 1 1   Control/stop worrying 0 2 1 0  Worry too much - different things 1 2 1 1   Trouble relaxing  2 0 0  Restless 0 2 0 1  Easily annoyed or irritable 0 1 0 0  Afraid -  awful might happen 0 1 0 0  Total GAD 7 Score  12 3 3   Anxiety Difficulty Not difficult at all Somewhat difficult Somewhat difficult Not difficult at all       03/31/2023    1:18 PM 11/26/2022   10:45 AM 08/01/2022   11:04 AM  Depression screen PHQ 2/9  Decreased Interest 0 1 1  Down, Depressed, Hopeless 1 2 1   PHQ - 2 Score 1 3 2   Altered sleeping 2 3 0  Tired, decreased energy 1 3 1   Change in appetite 1 1 0  Feeling bad or failure about yourself  0 2 0  Trouble concentrating 1 1 0  Moving slowly or fidgety/restless 0 2 0  Suicidal thoughts 0 0 0  PHQ-9 Score 6 15 3   Difficult doing work/chores Not difficult at all Somewhat difficult Not difficult at all    BP Readings from Last 3 Encounters:  03/31/23 122/78  11/26/22 126/76  07/31/22 126/72    Physical Exam  Wt Readings from Last 3  Encounters:  03/31/23 203 lb (92.1 kg)  11/26/22 209 lb 6.4 oz (95 kg)  08/01/22 204 lb (92.5 kg)    There were no vitals taken for this visit.  Assessment and Plan:  Problem List Items Addressed This Visit   None   No follow-ups on file.    Reubin Milan, MD [facility] [place]

## 2023-05-06 DIAGNOSIS — H1031 Unspecified acute conjunctivitis, right eye: Secondary | ICD-10-CM | POA: Diagnosis not present

## 2023-05-08 DIAGNOSIS — H10501 Unspecified blepharoconjunctivitis, right eye: Secondary | ICD-10-CM | POA: Diagnosis not present

## 2023-05-08 DIAGNOSIS — I10 Essential (primary) hypertension: Secondary | ICD-10-CM | POA: Diagnosis not present

## 2023-06-09 ENCOUNTER — Telehealth: Payer: Self-pay | Admitting: Internal Medicine

## 2023-06-09 ENCOUNTER — Other Ambulatory Visit: Payer: Self-pay | Admitting: Internal Medicine

## 2023-06-09 DIAGNOSIS — H40013 Open angle with borderline findings, low risk, bilateral: Secondary | ICD-10-CM | POA: Diagnosis not present

## 2023-06-09 DIAGNOSIS — H2513 Age-related nuclear cataract, bilateral: Secondary | ICD-10-CM | POA: Diagnosis not present

## 2023-06-09 DIAGNOSIS — D3132 Benign neoplasm of left choroid: Secondary | ICD-10-CM | POA: Diagnosis not present

## 2023-06-09 DIAGNOSIS — E119 Type 2 diabetes mellitus without complications: Secondary | ICD-10-CM | POA: Diagnosis not present

## 2023-06-09 LAB — HM DIABETES EYE EXAM

## 2023-06-09 NOTE — Telephone Encounter (Signed)
 Copied from CRM [id number]. Topic: Clinical - Medication Refill >> Jun 09, 2023  2:34 PM Turkey B wrote: Most Recent Primary Care Visit:  Provider: Sheron Dixons  Department: [facility]  Visit Type: OFFICE VISIT  Date: 03/31/2023  Medication: desloratadine  (CLARINEX ) 5 MG tablet  Has the patient contacted their pharmacy? ye (Agent: If yes, when and what did the pharmacy advise?)community pharmacy called in directly  Is this the correct pharmacy for this prescription? yes  This is the patient's preferred pharmacy:  [facility], [place] - [address] [place] [address] Phone: [phone] Fax: [phone]   Has the prescription been filled recently? no  Is the patient out of the medication? yes  Has the patient been seen for an appointment in the last year OR does the patient have an upcoming appointment? yes  Can we respond through MyChart? No, has 2 left  Agent: Please be advised that Rx refills may take up to 3 business days. We ask that you follow-up with your pharmacy.

## 2023-06-10 MED ORDER — DESLORATADINE 5 MG PO TABS: 5.0000 mg | ORAL_TABLET | Freq: Every day | ORAL | 0 refills | Status: DC

## 2023-06-10 NOTE — Telephone Encounter (Signed)
 Requested Prescriptions  Pending Prescriptions Disp Refills   desloratadine  (CLARINEX ) 5 MG tablet 90 tablet 0    Sig: Take 1 tablet (5 mg total) by mouth daily.     Ear, Nose, and Throat:  Antihistamines Passed - 06/10/2023  1:32 PM      Passed - Valid encounter within last 12 months    Recent Outpatient Visits           2 months ago Essential (primary) hypertension   [place] [facility] at [facility], Chales [place], MD       Future Appointments             In 1 month Gala Jubilee, Chales [place], MD [facility] Primary [facility] Medicine at [facility], [facility]

## 2023-06-12 DIAGNOSIS — L570 Actinic keratosis: Secondary | ICD-10-CM | POA: Diagnosis not present

## 2023-06-12 DIAGNOSIS — D225 Melanocytic nevi of trunk: Secondary | ICD-10-CM | POA: Diagnosis not present

## 2023-06-12 DIAGNOSIS — L821 Other seborrheic keratosis: Secondary | ICD-10-CM | POA: Diagnosis not present

## 2023-06-12 DIAGNOSIS — Z1283 Encounter for screening for malignant neoplasm of skin: Secondary | ICD-10-CM | POA: Diagnosis not present

## 2023-06-12 DIAGNOSIS — L820 Inflamed seborrheic keratosis: Secondary | ICD-10-CM | POA: Diagnosis not present

## 2023-06-12 DIAGNOSIS — Z85828 Personal history of other malignant neoplasm of skin: Secondary | ICD-10-CM | POA: Diagnosis not present

## 2023-06-26 DIAGNOSIS — H903 Sensorineural hearing loss, bilateral: Secondary | ICD-10-CM | POA: Diagnosis not present

## 2023-06-26 DIAGNOSIS — R42 Dizziness and giddiness: Secondary | ICD-10-CM | POA: Diagnosis not present

## 2023-07-07 DIAGNOSIS — J029 Acute pharyngitis, unspecified: Secondary | ICD-10-CM | POA: Diagnosis not present

## 2023-07-07 DIAGNOSIS — J208 Acute bronchitis due to other specified organisms: Secondary | ICD-10-CM | POA: Diagnosis not present

## 2023-07-07 DIAGNOSIS — B9689 Other specified bacterial agents as the cause of diseases classified elsewhere: Secondary | ICD-10-CM | POA: Diagnosis not present

## 2023-07-29 ENCOUNTER — Other Ambulatory Visit: Payer: Self-pay | Admitting: Internal Medicine

## 2023-07-29 DIAGNOSIS — E118 Type 2 diabetes mellitus with unspecified complications: Secondary | ICD-10-CM

## 2023-07-29 MED ORDER — DESLORATADINE 5 MG PO TABS: 5.0000 mg | ORAL_TABLET | Freq: Every day | ORAL | 0 refills | Status: DC

## 2023-07-29 MED ORDER — JARDIANCE 10 MG PO TABS: 10.0000 mg | ORAL_TABLET | Freq: Every day | ORAL | 0 refills | Status: DC

## 2023-07-29 NOTE — Telephone Encounter (Signed)
 Copied from CRM [id number]. Topic: Clinical - Medication Refill >> Jul 29, 2023  3:05 PM Alpha Arts wrote: Medication: desloratadine  (CLARINEX ) 5 MG tablet   Has the patient contacted their pharmacy? Yes (Agent: If no, request that the patient contact the pharmacy for the refill. If patient does not wish to contact the pharmacy document the reason why and proceed with request.) (Agent: If yes, when and what did the pharmacy advise?)  This is the patient's preferred pharmacy:   [facility], [place] - [address] [place] [address] Phone: [phone] Fax: [phone]  Is this the correct pharmacy for this prescription? Yes If no, delete pharmacy and type the correct one.   Has the prescription been filled recently? Yes  Is the patient out of the medication? Yes  Has the patient been seen for an appointment in the last year OR does the patient have an upcoming appointment? Yes  Can we respond through MyChart? Yes  Agent: Please be advised that Rx refills may take up to 3 business days. We ask that you follow-up with your pharmacy.

## 2023-07-29 NOTE — Telephone Encounter (Signed)
 Copied from CRM [id number]. Topic: Clinical - Prescription Issue >> Jul 29, 2023  3:33 PM Baldomero Bone wrote: Reason for CRM: Patient wants a 90-day supply of JARDIANCE  10 MG TABS tablet. She said the last one was for 30 days. Callback number is [phone]    90 days sent in.

## 2023-07-31 ENCOUNTER — Telehealth: Payer: Self-pay

## 2023-07-31 ENCOUNTER — Other Ambulatory Visit (HOSPITAL_COMMUNITY): Payer: Self-pay

## 2023-07-31 NOTE — Telephone Encounter (Signed)
Please see PA notes.Marland Kitchen

## 2023-07-31 NOTE — Telephone Encounter (Signed)
 Good afternoon while her previous insurance may have covered desloratadine  in the past, her current insurance will not because it's not on her formulary.    While many allergy medications are now OTC the only other choice would be a prescription for levocetirizine (generic Xyzal) and that is covered under her plan and her copay for 30 day supply is $0.00

## 2023-08-01 ENCOUNTER — Encounter: Payer: Self-pay | Admitting: Internal Medicine

## 2023-08-01 ENCOUNTER — Ambulatory Visit (INDEPENDENT_AMBULATORY_CARE_PROVIDER_SITE_OTHER): Payer: Self-pay | Admitting: Internal Medicine

## 2023-08-01 VITALS — BP 126/78 | HR 80 | Ht 61.42 in | Wt 205.5 lb

## 2023-08-01 DIAGNOSIS — I350 Nonrheumatic aortic (valve) stenosis: Secondary | ICD-10-CM

## 2023-08-01 DIAGNOSIS — E785 Hyperlipidemia, unspecified: Secondary | ICD-10-CM

## 2023-08-01 DIAGNOSIS — Z7984 Long term (current) use of oral hypoglycemic drugs: Secondary | ICD-10-CM

## 2023-08-01 DIAGNOSIS — E118 Type 2 diabetes mellitus with unspecified complications: Secondary | ICD-10-CM

## 2023-08-01 DIAGNOSIS — Z0000 Encounter for general adult medical examination without abnormal findings: Secondary | ICD-10-CM | POA: Diagnosis not present

## 2023-08-01 DIAGNOSIS — G4733 Obstructive sleep apnea (adult) (pediatric): Secondary | ICD-10-CM

## 2023-08-01 DIAGNOSIS — I10 Essential (primary) hypertension: Secondary | ICD-10-CM

## 2023-08-01 DIAGNOSIS — N1831 Chronic kidney disease, stage 3a: Secondary | ICD-10-CM | POA: Diagnosis not present

## 2023-08-01 DIAGNOSIS — R42 Dizziness and giddiness: Secondary | ICD-10-CM | POA: Insufficient documentation

## 2023-08-01 DIAGNOSIS — E1169 Type 2 diabetes mellitus with other specified complication: Secondary | ICD-10-CM | POA: Diagnosis not present

## 2023-08-01 DIAGNOSIS — F3341 Major depressive disorder, recurrent, in partial remission: Secondary | ICD-10-CM

## 2023-08-01 MED ORDER — LEVOCETIRIZINE DIHYDROCHLORIDE 5 MG PO TABS: 5.0000 mg | ORAL_TABLET | Freq: Every evening | ORAL | 0 refills | Status: DC

## 2023-08-01 NOTE — Assessment & Plan Note (Addendum)
 Mild AS without progressive chest pain or shortness of breath. Seeing Cardiology regularly -  Recent ECHO: CONCLUSION -------------------------------------------------------------------------------  NORMAL LEFT VENTRICULAR SYSTOLIC FUNCTION WITH MILD LVH  ESTIMATED EF: 55%, CALC EF(2D): 54%  INDETERMINATE DIASTOLIC FUNCTION  NORMAL RIGHT VENTRICULAR SYSTOLIC FUNCTION  VALVULAR REGURGITATION: No AR, TRIVIAL MR, No PR, MILD TR  VALVULAR STENOSIS: MILD AS, MILD MS, No PS, No TS  ------------------------------------------------------------------

## 2023-08-01 NOTE — Assessment & Plan Note (Signed)
 Seen by ENT - felt to be inner ear in origin and improved with Epley maneuver.

## 2023-08-01 NOTE — Assessment & Plan Note (Signed)
 LDL is  Lab Results  Component Value Date   LDLCALC 76 07/31/2022   Current regimen is atorvastatin .  No medication side effects noted. Goal LDL is <70.

## 2023-08-01 NOTE — Assessment & Plan Note (Signed)
 Blood pressure is well controlled.  Current medications Coreg, amlodipine  and lisinopril . Will continue same regimen along with efforts to limit dietary sodium.

## 2023-08-01 NOTE — Assessment & Plan Note (Signed)
 Blood sugars have been stable.  No recent hypoglycemic events requiring assistance. Currently medications are Jardiance  and MTF. Lab Results  Component Value Date   HGBA1C 7.0 (H) 03/31/2023   Last visit no changes were made.

## 2023-08-01 NOTE — Progress Notes (Signed)
 Date:  08/01/2023   Name:  Kimberly Walls   DOB:  Oct 05, 1940   MRN:  [id number]   Chief Complaint: Annual Exam Kimberly Walls is a 83 y.o. female who presents today for her Complete Annual Exam. She feels well. She reports exercising none, but does leg exercises . She reports she is sleeping well. Breast complaints none.  She continues to work at the [employer] and for the [employer].  Health Maintenance  Topic Date Due   Yearly kidney health urinalysis for diabetes  07/31/2023   Medicare Annual Wellness Visit  08/01/2023   COVID-19 Vaccine (4 - 2024-25 season) 08/16/2024*   Flu Shot  09/19/2023   Hemoglobin A1C  09/28/2023   Yearly kidney function blood test for diabetes  03/30/2024   Eye exam for diabetics  06/08/2024   Complete foot exam   07/31/2024   DTaP/Tdap/Td vaccine (2 - Td or Tdap) 07/02/2026   Pneumococcal Vaccine for age over 68  Completed   DEXA scan (bone density measurement)  Completed   HPV Vaccine  Aged Out   Meningitis B Vaccine  Aged Out   Hepatitis C Screening  Discontinued   Zoster (Shingles) Vaccine  Discontinued  *Topic was postponed. The date shown is not the original due date.    Diabetes She presents for her follow-up diabetic visit. She has type 2 diabetes mellitus. Hypoglycemia symptoms include dizziness. Pertinent negatives for hypoglycemia include no headaches or nervousness/anxiousness. Pertinent negatives for diabetes include no chest pain, no fatigue and no weakness. Pertinent negatives for diabetic complications include no CVA. Current diabetic treatments: MTF and jardiance .  Hypertension This is a chronic problem. The problem is controlled. Pertinent negatives include no chest pain, headaches, palpitations or shortness of breath. Past treatments include beta blockers, ACE inhibitors and calcium  channel blockers. The current treatment provides significant improvement. Hypertensive end-organ damage includes kidney disease. There is no history  of CAD/MI or CVA.  Hyperlipidemia This is a chronic problem. The problem is controlled. Pertinent negatives include no chest pain, myalgias or shortness of breath. Current antihyperlipidemic treatment includes statins. The current treatment provides significant improvement of lipids.  Depression        This is a chronic problem.The problem is unchanged.  Associated symptoms include no fatigue, no myalgias and no headaches.  Past treatments include SSRIs - Selective serotonin reuptake inhibitors.   Review of Systems  Constitutional:  Negative for chills, fatigue and unexpected weight change.  HENT:  Negative for sinus pressure, sore throat and trouble swallowing.   Eyes:  Negative for visual disturbance.  Respiratory:  Positive for cough. Negative for chest tightness, shortness of breath and wheezing.   Cardiovascular:  Negative for chest pain, palpitations and leg swelling.  Gastrointestinal:  Negative for abdominal pain, constipation and diarrhea.  Genitourinary:  Negative for frequency and urgency.  Musculoskeletal:  Negative for arthralgias and myalgias.  Skin:  Negative for color change and rash.  Neurological:  Positive for dizziness. Negative for weakness, light-headedness and headaches.  Psychiatric/Behavioral:  Positive for depression. Negative for dysphoric mood and sleep disturbance. The patient is not nervous/anxious.      Lab Results  Component Value Date   NA 141 03/31/2023   K 4.4 03/31/2023   CO2 22 03/31/2023   GLUCOSE 131 (H) 03/31/2023   BUN 20 03/31/2023   CREATININE 0.98 03/31/2023   CALCIUM  9.7 03/31/2023   EGFR 58 (L) 03/31/2023   GFRNONAA 62 11/16/2019   Lab Results  Component Value Date   CHOL 158 07/31/2022   HDL 42 07/31/2022   LDLCALC 76 07/31/2022   TRIG 244 (H) 07/31/2022   CHOLHDL 3.8 07/31/2022   Lab Results  Component Value Date   TSH 3.270 07/31/2022   Lab Results  Component Value Date   HGBA1C 7.0 (H) 03/31/2023   Lab Results   Component Value Date   WBC 8.3 07/31/2022   HGB 15.1 07/31/2022   HCT 46.0 07/31/2022   MCV 86 07/31/2022   PLT 246 07/31/2022   Lab Results  Component Value Date   ALT 16 07/31/2022   AST 30 07/31/2022   ALKPHOS 102 07/31/2022   BILITOT 0.4 07/31/2022   No results found for: Lucetta Russel, VD25OH   Patient Active Problem List   Diagnosis Date Noted   Vertigo 08/01/2023   CKD stage 3a, GFR 45-59 ml/min (HCC) 04/25/2023   Obesity, morbid (HCC) 03/31/2023   Tinnitus aurium, bilateral 03/31/2023   Arthralgia 07/27/2021   Aortic stenosis 11/06/2020   Atypical chest pain 09/12/2020   BMI 38.0-38.9,adult 06/29/2015   History of pulmonary embolus (PE) 06/29/2015   DM type 2, controlled, with complication (HCC) 03/06/2015   Arthritis, degenerative 12/05/2014   DDD (degenerative disc disease), lumbar 08/29/2014   Hyperlipidemia associated with type 2 diabetes mellitus (HCC) 08/29/2014   Essential (primary) hypertension 08/29/2014   Dyspepsia 08/29/2014   Herpes 08/29/2014   Obstructive sleep apnea of adult 08/29/2014   Depression, major, recurrent, in partial remission (HCC) 08/29/2014   Headache, tension-type 08/29/2014   History of colon polyps 02/19/2008   Personal history of malignant neoplasm of breast 02/19/1991    Allergies  Allergen Reactions   Prochlorperazine    Pneumococcal Vaccine Rash   Tetanus Toxoids Rash    Past Surgical History:  Procedure Laterality Date   BUNIONECTOMY Bilateral    CARPAL TUNNEL RELEASE Left    LUMBAR LAMINECTOMY     MASTECTOMY MODIFIED RADICAL Bilateral 1993   PARTIAL HYSTERECTOMY     TOTAL KNEE ARTHROPLASTY Bilateral    TOTAL SHOULDER REPLACEMENT Bilateral     Social History   Tobacco Use   Smoking status: Never   Smokeless tobacco: Never  Vaping Use   Vaping status: Never Used  Substance Use Topics   Alcohol use: Not Currently   Drug use: No     Medication list has been reviewed and updated.  Current  Meds  Medication Sig   amLODipine  (NORVASC ) 5 MG tablet Take 1 tablet (5 mg total) by mouth daily.   ascorbic acid (VITAMIN C) 250 MG tablet Take 250 mg by mouth daily.   aspirin 81 MG tablet Take 1 tablet by mouth daily.   atorvastatin  (LIPITOR) 10 MG tablet TAKE ONE TABLET BY MOUTH ONCE DAILY   Biotin 2500 MCG CAPS Take 5,000 mcg by mouth daily.   Calcium  Carb-Cholecalciferol (CALCIUM  + D3) 600-200 MG-UNIT TABS Take 1 tablet by mouth daily.   carvedilol (COREG) 6.25 MG tablet Take 6.25 mg by mouth 2 (two) times daily.   cetirizine (ZYRTEC) 5 MG tablet Take 5 mg by mouth daily.   Cinnamon 500 MG TABS Take 2 tablets by mouth daily.   Coenzyme Q10 (COQ10) 200 MG CAPS Take 1 capsule by mouth daily.   Elderberry 500 MG CAPS Take by mouth.   escitalopram  (LEXAPRO ) 10 MG tablet TAKE ONE TABLET BY MOUTH ONCE DAILY   glucose blood test strip Use to test BS twice a day   JARDIANCE  10 MG TABS tablet  Take 1 tablet (10 mg total) by mouth daily.   levocetirizine (XYZAL) 5 MG tablet Take 1 tablet (5 mg total) by mouth every evening.   lisinopril  (ZESTRIL ) 40 MG tablet Take 1 tablet (40 mg total) by mouth daily.   metFORMIN  (GLUCOPHAGE -XR) 500 MG 24 hr tablet Take 1 tablet (500 mg total) by mouth 2 (two) times daily.   Multiple Vitamin (MULTIVITAMIN) capsule Take 1 capsule by mouth daily.   omeprazole  (PRILOSEC) 20 MG capsule Take 1 capsule (20 mg total) by mouth daily.   Probiotic Product (PROBIOTIC PO) Take by mouth.   zinc gluconate 50 MG tablet Take 50 mg by mouth daily.       08/01/2023    9:53 AM 03/31/2023    1:18 PM 11/26/2022   10:46 AM 07/31/2022    9:47 AM  GAD 7 : Generalized Anxiety Score  Nervous, Anxious, on Edge 0 0 2 1  Control/stop worrying 2 0 2 1  Worry too much - different things 1 1 2 1   Trouble relaxing 0  2 0  Restless 0 0 2 0  Easily annoyed or irritable 0 0 1 0  Afraid - awful might happen 0 0 1 0  Total GAD 7 Score 3  12 3   Anxiety Difficulty Somewhat difficult Not  difficult at all Somewhat difficult Somewhat difficult       08/01/2023    9:52 AM 03/31/2023    1:18 PM 11/26/2022   10:45 AM  Depression screen PHQ 2/9  Decreased Interest 0 0 1  Down, Depressed, Hopeless 0 1 2  PHQ - 2 Score 0 1 3  Altered sleeping 1 2 3   Tired, decreased energy 1 1 3   Change in appetite 0 1 1  Feeling bad or failure about yourself  0 0 2  Trouble concentrating 0 1 1  Moving slowly or fidgety/restless 0 0 2  Suicidal thoughts 0 0 0  PHQ-9 Score 2 6 15   Difficult doing work/chores Not difficult at all Not difficult at all Somewhat difficult    BP Readings from Last 3 Encounters:  08/01/23 126/78  03/31/23 122/78  11/26/22 126/76    Physical Exam Vitals and nursing note reviewed.  Constitutional:      General: She is not in acute distress.    Appearance: She is well-developed.  HENT:     Head: Normocephalic and atraumatic.     Right Ear: Tympanic membrane and ear canal normal.     Left Ear: Tympanic membrane and ear canal normal.     Nose:     Right Sinus: No maxillary sinus tenderness.     Left Sinus: No maxillary sinus tenderness.   Eyes:     General: No scleral icterus.       Right eye: No discharge.        Left eye: No discharge.     Conjunctiva/sclera: Conjunctivae normal.   Neck:     Thyroid : No thyromegaly.     Vascular: No carotid bruit.   Cardiovascular:     Rate and Rhythm: Normal rate and regular rhythm.     Pulses: Normal pulses.     Heart sounds: Normal heart sounds.  Pulmonary:     Effort: Pulmonary effort is normal. No respiratory distress.     Breath sounds: No wheezing.  Abdominal:     General: Bowel sounds are normal.     Palpations: Abdomen is soft.     Tenderness: There is no abdominal tenderness.  Musculoskeletal:     Cervical back: Normal range of motion. No erythema.     Right lower leg: No edema.     Left lower leg: No edema.  Lymphadenopathy:     Cervical: No cervical adenopathy.   Skin:    General: Skin  is warm and dry.     Capillary Refill: Capillary refill takes less than 2 seconds.     Findings: No rash.   Neurological:     General: No focal deficit present.     Mental Status: She is alert and oriented to person, place, and time.     Cranial Nerves: No cranial nerve deficit.     Sensory: No sensory deficit.     Deep Tendon Reflexes: Reflexes are normal and symmetric.   Psychiatric:        Attention and Perception: Attention normal.        Mood and Affect: Mood normal.    Diabetic Foot Exam - Simple   Simple Foot Form Diabetic Foot exam was performed with the following findings: Yes 08/01/2023 10:11 AM  Visual Inspection No deformities, no ulcerations, no other skin breakdown bilaterally: Yes Sensation Testing Intact to touch and monofilament testing bilaterally: Yes Pulse Check Posterior Tibialis and Dorsalis pulse intact bilaterally: Yes Comments      Wt Readings from Last 3 Encounters:  08/01/23 205 lb 8 oz (93.2 kg)  03/31/23 203 lb (92.1 kg)  11/26/22 209 lb 6.4 oz (95 kg)    BP 126/78   Pulse 80   Ht 5' 1.42 (1.56 m)   Wt 205 lb 8 oz (93.2 kg)   SpO2 95%   BMI 38.30 kg/m   Assessment and Plan:  Problem List Items Addressed This Visit       Unprioritized   Hyperlipidemia associated with type 2 diabetes mellitus (HCC) (Chronic)   LDL is  Lab Results  Component Value Date   LDLCALC 76 07/31/2022   Current regimen is atorvastatin .  No medication side effects noted. Goal LDL is <70.       Relevant Orders   Lipid panel   Essential (primary) hypertension (Chronic)   Blood pressure is well controlled.  Current medications Coreg, amlodipine  and lisinopril . Will continue same regimen along with efforts to limit dietary sodium.       Relevant Orders   CBC with Differential/Platelet   Comprehensive metabolic panel with GFR   Obstructive sleep apnea of adult (Chronic)   Not currently being treated due to CPAP device intolerance.      Depression,  major, recurrent, in partial remission (HCC) (Chronic)   Clinically stable on Lexapro .   No SI or HI on evaluation. Plan to continue same medications for now.       Relevant Orders   TSH   DM type 2, controlled, with complication (HCC) (Chronic)   Blood sugars have been stable.  No recent hypoglycemic events requiring assistance. Currently medications are Jardiance  and MTF. Lab Results  Component Value Date   HGBA1C 7.0 (H) 03/31/2023   Last visit no changes were made.       Relevant Orders   Comprehensive metabolic panel with GFR   Hemoglobin A1c   Microalbumin / creatinine urine ratio   Aortic stenosis   Mild AS without progressive chest pain or shortness of breath. Seeing Cardiology regularly -  Recent ECHO: CONCLUSION -------------------------------------------------------------------------------  NORMAL LEFT VENTRICULAR SYSTOLIC FUNCTION WITH MILD LVH  ESTIMATED EF: 55%, CALC EF(2D): 54%  INDETERMINATE DIASTOLIC FUNCTION  NORMAL RIGHT  VENTRICULAR SYSTOLIC FUNCTION  VALVULAR REGURGITATION: No AR, TRIVIAL MR, No PR, MILD TR  VALVULAR STENOSIS: MILD AS, MILD MS, No PS, No TS  ------------------------------------------------------------------       CKD stage 3a, GFR 45-59 ml/min (HCC)   Relevant Orders   Comprehensive metabolic panel with GFR   Vertigo   Seen by ENT - felt to be inner ear in origin and improved with Epley maneuver.      Other Visit Diagnoses       Annual physical exam    -  Primary   up to date on screenings and immunizations.     Long term current use of oral hypoglycemic drug           Return in about 4 months (around 12/01/2023) for DM, HTN.    Sheron Dixons, MD [facility] [place]

## 2023-08-01 NOTE — Assessment & Plan Note (Signed)
 Clinically stable on Lexapro.   No SI or HI on evaluation. Plan to continue same medications for now.

## 2023-08-01 NOTE — Assessment & Plan Note (Signed)
 Not currently being treated due to CPAP device intolerance.

## 2023-08-03 LAB — COMPREHENSIVE METABOLIC PANEL WITH GFR
ALT: 15 IU/L (ref 0–32)
AST: 27 IU/L (ref 0–40)
Albumin: 4.1 g/dL (ref 3.7–4.7)
Alkaline Phosphatase: 99 IU/L (ref 44–121)
BUN/Creatinine Ratio: 24 (ref 12–28)
BUN: 22 mg/dL (ref 8–27)
Bilirubin Total: 0.4 mg/dL (ref 0.0–1.2)
CO2: 22 mmol/L (ref 20–29)
Calcium: 9.5 mg/dL (ref 8.7–10.3)
Chloride: 99 mmol/L (ref 96–106)
Creatinine, Ser: 0.92 mg/dL (ref 0.57–1.00)
Globulin, Total: 2.3 g/dL (ref 1.5–4.5)
Glucose: 132 mg/dL — ABNORMAL HIGH (ref 70–99)
Potassium: 4.7 mmol/L (ref 3.5–5.2)
Sodium: 136 mmol/L (ref 134–144)
Total Protein: 6.4 g/dL (ref 6.0–8.5)
eGFR: 63 mL/min/{1.73_m2} (ref >59)

## 2023-08-03 LAB — LIPID PANEL
Chol/HDL Ratio: 3.8 ratio (ref 0.0–4.4)
Cholesterol, Total: 154 mg/dL (ref 100–199)
HDL: 41 mg/dL (ref >39)
LDL Chol Calc (NIH): 74 mg/dL (ref 0–99)
Triglycerides: 238 mg/dL — ABNORMAL HIGH (ref 0–149)
VLDL Cholesterol Cal: 39 mg/dL (ref 5–40)

## 2023-08-03 LAB — CBC WITH DIFFERENTIAL/PLATELET
Basophils Absolute: 0.1 10*3/uL (ref 0.0–0.2)
Basos: 2 %
EOS (ABSOLUTE): 0.4 10*3/uL (ref 0.0–0.4)
Eos: 6 %
Hematocrit: 43.9 % (ref 34.0–46.6)
Hemoglobin: 14.4 g/dL (ref 11.1–15.9)
Immature Grans (Abs): 0 10*3/uL (ref 0.0–0.1)
Immature Granulocytes: 0 %
Lymphocytes Absolute: 2.2 10*3/uL (ref 0.7–3.1)
Lymphs: 33 %
MCH: 29 pg (ref 26.6–33.0)
MCHC: 32.8 g/dL (ref 31.5–35.7)
MCV: 88 fL (ref 79–97)
Monocytes Absolute: 0.8 10*3/uL (ref 0.1–0.9)
Monocytes: 12 %
Neutrophils Absolute: 3.2 10*3/uL (ref 1.4–7.0)
Neutrophils: 47 %
Platelets: 230 10*3/uL (ref 150–450)
RBC: 4.97 x10E6/uL (ref 3.77–5.28)
RDW: 14.5 % (ref 11.7–15.4)
WBC: 6.6 10*3/uL (ref 3.4–10.8)

## 2023-08-03 LAB — HEMOGLOBIN A1C
Est. average glucose Bld gHb Est-mCnc: 157 mg/dL
Hgb A1c MFr Bld: 7.1 % — ABNORMAL HIGH (ref 4.8–5.6)

## 2023-08-03 LAB — MICROALBUMIN / CREATININE URINE RATIO
Creatinine, Urine: 28.5 mg/dL
Microalb/Creat Ratio: <11 mg/g{creat} (ref 0–29)
Microalbumin, Urine: <3 ug/mL

## 2023-08-03 LAB — TSH: TSH: 2.53 u[IU]/mL (ref 0.450–4.500)

## 2023-08-04 ENCOUNTER — Ambulatory Visit: Payer: Self-pay | Admitting: Internal Medicine

## 2023-08-13 ENCOUNTER — Ambulatory Visit: Payer: Medicare Other | Admitting: Emergency Medicine

## 2023-08-13 VITALS — Ht 61.0 in | Wt 205.0 lb

## 2023-08-13 DIAGNOSIS — Z780 Asymptomatic menopausal state: Secondary | ICD-10-CM | POA: Diagnosis not present

## 2023-08-13 DIAGNOSIS — Z0000 Encounter for general adult medical examination without abnormal findings: Secondary | ICD-10-CM | POA: Diagnosis not present

## 2023-08-13 NOTE — Patient Instructions (Signed)
 Kimberly Walls , Thank you for taking time out of your busy schedule to complete your Annual Wellness Visit with me. I enjoyed our conversation and look forward to speaking with you again next year. I, as well as your care team,  appreciate your ongoing commitment to your health goals. Please review the following plan we discussed and let me know if I can assist you in the future. Your Game plan/ To Do List    Referrals: None   Follow up Visits: Next Medicare AWV with our clinical staff: 08/18/24 @ 10:50am (PHONE VISIT)   Have you seen your provider in the last 6 months (3 months if uncontrolled diabetes)? Yes Next Office Visit with your provider: 12/03/23 @ 10:00am with Dr. Justus  Clinician Recommendations: I have placed an order for a bone density test. Please call [facility] @ [phone] to schedule at your convenience.  Aim for 30 minutes of exercise or brisk walking, 6-8 glasses of water, and 5 servings of fruits and vegetables each day.       This is a list of the screening recommended for you and due dates:  Health Maintenance  Topic Date Due   DEXA scan (bone density measurement)  07/23/2023   COVID-19 Vaccine (4 - 2024-25 season) 08/16/2024*   Flu Shot  09/19/2023   Hemoglobin A1C  01/31/2024   Eye exam for diabetics  06/08/2024   Yearly kidney function blood test for diabetes  07/31/2024   Yearly kidney health urinalysis for diabetes  07/31/2024   Complete foot exam   07/31/2024   Medicare Annual Wellness Visit  08/12/2024   DTaP/Tdap/Td vaccine (2 - Td or Tdap) 07/02/2026   Pneumococcal Vaccine for age over 62  Completed   Hepatitis B Vaccine  Aged Out   HPV Vaccine  Aged Out   Meningitis B Vaccine  Aged Out   Hepatitis C Screening  Discontinued   Zoster (Shingles) Vaccine  Discontinued  *Topic was postponed. The date shown is not the original due date.    Advanced directives: (ACP Link)Information on Advanced Care Planning can be found at [place]   Secretary of [facility] Advance Health Care Directives Advance Health Care Directives. [email or web address] You may also get the forms at your doctor's office. Advance Care Planning is important because it:  [x]  Makes sure you receive the medical care that is consistent with your values, goals, and preferences  [x]  It provides guidance to your family and loved ones and reduces their decisional burden about whether or not they are making the right decisions based on your wishes.  Follow the link provided in your after visit summary or read over the paperwork we have mailed to you to help you started getting your Advance Directives in place. If you need assistance in completing these, please reach out to us  so that we can help you!  See attachments for Preventive Care and Fall Prevention Tips.   Fall Prevention in the Home, Adult Falls can cause injuries and affect people of all ages. There are many simple things that you can do to make your home safe and to help prevent falls. If you need it, ask for help making these changes. What actions can I take to prevent falls? General information Use good lighting in all rooms. Make sure to: Replace any light bulbs that burn out. Turn on lights if it is dark and use night-lights. Keep items that you use often in easy-to-reach places. Lower the shelves around your home if needed.  Move furniture so that there are clear paths around it. Do not keep throw rugs or other things on the floor that can make you trip. If any of your floors are uneven, fix them. Add color or contrast paint or tape to clearly mark and help you see: Grab bars or handrails. First and last steps of staircases. Where the edge of each step is. If you use a ladder or stepladder: Make sure that it is fully opened. Do not climb a closed ladder. Make sure the sides of the ladder are locked in place. Have someone hold the ladder while you use it. Know where your pets are as you move through your  home. What can I do in the bathroom?     Keep the floor dry. Clean up any water that is on the floor right away. Remove soap buildup in the bathtub or shower. Buildup makes bathtubs and showers slippery. Use non-skid mats or decals on the floor of the bathtub or shower. Attach bath mats securely with double-sided, non-slip rug tape. If you need to sit down while you are in the shower, use a non-slip stool. Install grab bars by the toilet and in the bathtub and shower. Do not use towel bars as grab bars. What can I do in the bedroom? Make sure that you have a light by your bed that is easy to reach. Do not use any sheets or blankets on your bed that hang to the floor. Have a firm bench or chair with side arms that you can use for support when you get dressed. What can I do in the kitchen? Clean up any spills right away. If you need to reach something above you, use a sturdy step stool that has a grab bar. Keep electrical cables out of the way. Do not use floor polish or wax that makes floors slippery. What can I do with my stairs? Do not leave anything on the stairs. Make sure that you have a light switch at the top and the bottom of the stairs. Have them installed if you do not have them. Make sure that there are handrails on both sides of the stairs. Fix handrails that are broken or loose. Make sure that handrails are as long as the staircases. Install non-slip stair treads on all stairs in your home if they do not have carpet. Avoid having throw rugs at the top or bottom of stairs, or secure the rugs with carpet tape to prevent them from moving. Choose a carpet design that does not hide the edge of steps on the stairs. Make sure that carpet is firmly attached to the stairs. Fix any carpet that is loose or worn. What can I do on the outside of my home? Use bright outdoor lighting. Repair the edges of walkways and driveways and fix any cracks. Clear paths of anything that can make you  trip, such as tools or rocks. Add color or contrast paint or tape to clearly mark and help you see high doorway thresholds. Trim any bushes or trees on the main path into your home. Check that handrails are securely fastened and in good repair. Both sides of all steps should have handrails. Install guardrails along the edges of any raised decks or porches. Have leaves, snow, and ice cleared regularly. Use sand, salt, or ice melt on walkways during winter months if you live where there is ice and snow. In the garage, clean up any spills right away, including grease or oil  spills. What other actions can I take? Review your medicines with your health care provider. Some medicines can make you confused or feel dizzy. This can increase your chance of falling. Wear closed-toe shoes that fit well and support your feet. Wear shoes that have rubber soles and low heels. Use a cane, walker, scooter, or crutches that help you move around if needed. Talk with your provider about other ways that you can decrease your risk of falls. This may include seeing a physical therapist to learn to do exercises to improve movement and strength. Where to find more information Centers for Disease Control and Prevention, [employer]: TonerPromos.no [employer] on Aging: BaseRingTones.pl National Institute on Aging: BaseRingTones.pl Contact a health care provider if: You are afraid of falling at home. You feel weak, drowsy, or dizzy at home. You fall at home. Get help right away if you: Lose consciousness or have trouble moving after a fall. Have a fall that causes a head injury. These symptoms may be an emergency. Get help right away. Call 911. Do not wait to see if the symptoms will go away. Do not drive yourself to the hospital. This information is not intended to replace advice given to you by your health care provider. Make sure you discuss any questions you have with your health care provider. Document Revised: 10/08/2021  Document Reviewed: 10/08/2021 Elsevier Patient Education  2024 [employer].

## 2023-08-13 NOTE — Progress Notes (Signed)
 Subjective:   Kimberly Walls is a 83 y.o. who presents for a Medicare Wellness preventive visit.  As a reminder, Annual Wellness Visits don't include a physical exam, and some assessments may be limited, especially if this visit is performed virtually. We may recommend an in-person follow-up visit with your provider if needed.  Visit Complete: Virtual I connected with  Kimberly Walls on 08/13/23 by a audio enabled telemedicine application and verified that I am speaking with the correct person using two identifiers.  Patient Location: Home  Provider Location: Home Office  I discussed the limitations of evaluation and management by telemedicine. The patient expressed understanding and agreed to proceed.  Vital Signs: Because this visit was a virtual/telehealth visit, some criteria may be missing or patient reported. Any vitals not documented were not able to be obtained and vitals that have been documented are patient reported.  VideoDeclined- This patient declined Librarian, academic. Therefore the visit was completed with audio only.  Persons Participating in Visit: Patient.  AWV Questionnaire: No: Patient Medicare AWV questionnaire was not completed prior to this visit.  Cardiac Risk Factors include: advanced age (>104men, >40 women);hypertension;dyslipidemia;obesity (BMI >30kg/m2);diabetes mellitus;Other (see comment), Risk factor comments: OSA (no cpap)     Objective:    Today's Vitals   08/13/23 1041  Weight: 205 lb (93 kg)  Height: 5' 1 (1.549 m)   Body mass index is 38.73 kg/m.     08/13/2023   11:04 AM 08/01/2022   11:06 AM 07/25/2021    1:14 PM 07/24/2020    2:50 PM 07/14/2019    3:36 PM 07/07/2017   10:43 AM 06/29/2015   10:45 AM  Advanced Directives  Does Patient Have a Medical Advance Directive? No No Yes No No No  No   Type of Advance Directive   Healthcare Power of Attorney      Does patient want to make changes to medical advance  directive?    No - Patient declined     Copy of Healthcare Power of Attorney in Chart?   No - copy requested      Would patient like information on creating a medical advance directive? Yes (MAU/Ambulatory/Procedural Areas - Information given) No - Patient declined   No - Patient declined Yes (MAU/Ambulatory/Procedural Areas - Information given)  No - patient declined information      Data saved with a previous flowsheet row definition    Current Medications (verified) Outpatient Encounter Medications as of 08/13/2023  Medication Sig   amLODipine  (NORVASC ) 5 MG tablet Take 1 tablet (5 mg total) by mouth daily.   ascorbic acid (VITAMIN C) 250 MG tablet Take 250 mg by mouth daily.   aspirin 81 MG tablet Take 1 tablet by mouth daily.   atorvastatin  (LIPITOR) 10 MG tablet TAKE ONE TABLET BY MOUTH ONCE DAILY   Biotin 2500 MCG CAPS Take 5,000 mcg by mouth daily.   Calcium  Carb-Cholecalciferol (CALCIUM  + D3) 600-200 MG-UNIT TABS Take 1 tablet by mouth daily.   carvedilol (COREG) 6.25 MG tablet Take 6.25 mg by mouth 2 (two) times daily.   Cinnamon 500 MG TABS Take 2 tablets by mouth daily.   Coenzyme Q10 (COQ10) 200 MG CAPS Take 1 capsule by mouth daily.   Elderberry 500 MG CAPS Take by mouth.   escitalopram  (LEXAPRO ) 10 MG tablet TAKE ONE TABLET BY MOUTH ONCE DAILY   glucose blood test strip Use to test BS twice a day   Homeopathic Products (ARNICARE)  GEL Apply topically daily.   JARDIANCE  10 MG TABS tablet Take 1 tablet (10 mg total) by mouth daily.   levocetirizine (XYZAL ) 5 MG tablet Take 1 tablet (5 mg total) by mouth every evening.   lisinopril  (ZESTRIL ) 40 MG tablet Take 1 tablet (40 mg total) by mouth daily.   metFORMIN  (GLUCOPHAGE -XR) 500 MG 24 hr tablet Take 1 tablet (500 mg total) by mouth 2 (two) times daily.   Multiple Vitamin (MULTIVITAMIN) capsule Take 1 capsule by mouth daily.   omeprazole  (PRILOSEC) 20 MG capsule Take 1 capsule (20 mg total) by mouth daily.   oxaprozin  (DAYPRO )  600 MG tablet TAKE ONE TABLET BY MOUTH TWICE DAILY   Probiotic Product (PROBIOTIC PO) Take by mouth.   zinc gluconate 50 MG tablet Take 50 mg by mouth daily.   cetirizine (ZYRTEC) 5 MG tablet Take 5 mg by mouth daily. (Patient not taking: Reported on 08/13/2023)   Cyanocobalamin (VITAMIN B-12 PO) Take 3,000 mcg by mouth daily.   hydroquinone 4 % cream Apply topically daily. (Patient not taking: Reported on 08/13/2023)   nystatin  cream (MYCOSTATIN ) Apply topically 2 (two) times daily. For rash under breast and abdomen (Patient not taking: Reported on 08/13/2023)   No facility-administered encounter medications on file as of 08/13/2023.    Allergies (verified) Prochlorperazine, Pneumococcal vaccine, and Tetanus toxoids   History: Past Medical History:  Diagnosis Date   Allergy    Anxiety    Depression    Diabetes mellitus without complication (HCC)    GERD (gastroesophageal reflux disease)    Hyperlipidemia    Hypertension    Sleep apnea    does not use CPAP   Past Surgical History:  Procedure Laterality Date   BUNIONECTOMY Bilateral    CARPAL TUNNEL RELEASE Left    LUMBAR LAMINECTOMY     MASTECTOMY MODIFIED RADICAL Bilateral 1993   PARTIAL HYSTERECTOMY     TOTAL KNEE ARTHROPLASTY Bilateral    TOTAL SHOULDER REPLACEMENT Bilateral    Family History  Problem Relation Age of Onset   Diabetes Mother    Hypertension Mother    CAD Father    Diabetes Father    Diabetes Sister    Hypertension Sister    Heart disease Brother    Healthy Sister    Social History   Socioeconomic History   Marital status: Divorced    Spouse name: Not on file   Number of children: 1   Years of education: 12+   Highest education level: Some college, no degree  Occupational History   Occupation: Retired    Comment: works part time for [employer]    Comment: also works for [employer]  Tobacco Use   Smoking status: Never   Smokeless tobacco: Never  Vaping Use   Vaping status: Never  Used  Substance and Sexual Activity   Alcohol use: Yes    Comment: social, 1-2 white russians every 2 weeks   Drug use: No   Sexual activity: Not Currently  Other Topics Concern   Not on file  Social History Narrative   Pt lives alone.    Social Drivers of Corporate investment banker Strain: Low Risk  (08/13/2023)   Overall Financial Resource Strain (CARDIA)    Difficulty of Paying Living Expenses: Not hard at all  Food Insecurity: No Food Insecurity (08/13/2023)   Hunger Vital Sign    Worried About Running Out of Food in the Last Year: Never true    Ran Out of Food  in the Last Year: Never true  Transportation Needs: No Transportation Needs (08/13/2023)   PRAPARE - Administrator, Civil Service (Medical): No    Lack of Transportation (Non-Medical): No  Physical Activity: Insufficiently Active (08/13/2023)   Exercise Vital Sign    Days of Exercise per Week: 3 days    Minutes of Exercise per Session: 20 min  Stress: No Stress Concern Present (08/13/2023)   [employer] of Occupational Health - Occupational Stress Questionnaire    Feeling of Stress: Not at all  Social Connections: Moderately Isolated (08/13/2023)   Social Connection and Isolation Panel    Frequency of Communication with Friends and Family: More than three times a week    Frequency of Social Gatherings with Friends and Family: Three times a week    Attends Religious Services: Never    Active Member of Clubs or Organizations: Yes    Attends Engineer, structural: More than 4 times per year    Marital Status: Divorced    Tobacco Counseling Counseling given: Not Answered    Clinical Intake:  Pre-visit preparation completed: Yes  Pain : No/denies pain     BMI - recorded: 38.73 Nutritional Status: BMI > 30  Obese Nutritional Risks: None Diabetes: Yes CBG done?: No Did pt. bring in CBG monitor from home?: No  Lab Results  Component Value Date   HGBA1C 7.1 (H) 08/01/2023   HGBA1C  7.0 (H) 03/31/2023   HGBA1C 6.8 (A) 11/26/2022     How often do you need to have someone help you when you read instructions, pamphlets, or other written materials from your doctor or pharmacy?: 1 - Never  Interpreter Needed?: No  Information entered by :: Vina Ned, CMA   Activities of Daily Living     08/13/2023   10:45 AM  In your present state of health, do you have any difficulty performing the following activities:  Hearing? 1  Comment some  Vision? 0  Difficulty concentrating or making decisions? 0  Walking or climbing stairs? 1  Comment due to 1 leg being 1/2 inch shorter than the other  Dressing or bathing? 0  Doing errands, shopping? 0  Preparing Food and eating ? N  Using the Toilet? N  In the past six months, have you accidently leaked urine? N  Do you have problems with loss of bowel control? N  Managing your Medications? N  Managing your Finances? N  Housekeeping or managing your Housekeeping? N    Patient Care Team: Justus Leita DEL, MD as PCP - General (Internal Medicine) Alvan Arvella Dover, MD as Referring Physician (Ophthalmology) Hershal Lynwood Cough, MD as Referring Physician (Cardiology) Trinidad Lauraine DEL, MD as Referring Physician (Otolaryngology)  I have updated your Care Teams any recent Medical Services you may have received from other providers in the past year.     Assessment:   This is a routine wellness examination for [place].  Hearing/Vision screen Hearing Screening - Comments:: Gets routine hearing exams Vision Screening - Comments:: Gets DM eye exams, Dr. Alvan, [facility] [place] [place]   Goals Addressed             This Visit's Progress    Patient Stated       Lose 20 lbs       Depression Screen     08/13/2023   11:02 AM 08/01/2023    9:52 AM 03/31/2023    1:18 PM 11/26/2022   10:45 AM 08/01/2022   11:04 AM 07/31/2022  9:47 AM 04/03/2022   10:36 AM  PHQ 2/9 Scores  PHQ - 2 Score 0 0 1 3 2 1 1   PHQ- 9 Score 1 2 6 15 3 6 5      Fall Risk     08/13/2023   11:06 AM 08/01/2023    9:52 AM 03/31/2023    1:18 PM 11/26/2022   10:45 AM 08/01/2022   11:07 AM  Fall Risk   Falls in the past year? 0 0 0 0 0  Number falls in past yr: 0 0 0 0 0  Injury with Fall? 0 0 0 0 0  Risk for fall due to : No Fall Risks No Fall Risks No Fall Risks No Fall Risks No Fall Risks  Follow up Falls evaluation completed Falls evaluation completed Falls evaluation completed Falls evaluation completed Falls prevention discussed;Falls evaluation completed    MEDICARE RISK AT HOME:  Medicare Risk at Home Any stairs in or around the home?: Yes If so, are there any without handrails?: No Home free of loose throw rugs in walkways, pet beds, electrical cords, etc?: Yes Adequate lighting in your home to reduce risk of falls?: Yes Life alert?: No Use of a cane, walker or w/c?: No Grab bars in the bathroom?: Yes Shower chair or bench in shower?: Yes Elevated toilet seat or a handicapped toilet?: No  TIMED UP AND GO:  Was the test performed?  No  Cognitive Function: 6CIT completed        08/13/2023   11:07 AM 08/01/2022   11:13 AM 07/14/2019    3:44 PM 07/08/2018   10:27 AM 07/07/2017   10:51 AM  6CIT Screen  What Year? 0 points 0 points 0 points 0 points 0 points  What month? 0 points 0 points 0 points 0 points 0 points  What time? 0 points 0 points 0 points 0 points 0 points  Count back from 20 0 points 0 points 0 points 0 points 0 points  Months in reverse 0 points 0 points 0 points 0 points 0 points  Repeat phrase 0 points 0 points 0 points 0 points 0 points  Total Score 0 points 0 points 0 points 0 points 0 points    Immunizations Immunization History  Administered Date(s) Administered   Fluad Quad(high Dose 65+) 11/20/2018, 11/16/2019, 12/01/2020, 11/28/2021   Fluad Trivalent(High Dose 65+) 11/26/2022   Hepatitis A, Adult 09/21/2018, 05/05/2019   Influenza Split 02/02/2004, 01/02/2009   Influenza, High Dose Seasonal PF  11/19/2016, 11/17/2017   Influenza,inj,Quad PF,6+ Mos 01/01/2016   Influenza,inj,quad, With Preservative 11/19/2018   Influenza-Unspecified 11/25/2014   PFIZER(Purple Top)SARS-COV-2 Vaccination 03/24/2019, 04/14/2019, 03/28/2020   Pneumococcal Conjugate-13 06/27/2014   Pneumococcal Polysaccharide-23 07/20/2018   Tdap 07/01/2016    Screening Tests Health Maintenance  Topic Date Due   DEXA SCAN  07/23/2023   COVID-19 Vaccine (4 - 2024-25 season) 08/16/2024 (Originally 10/20/2022)   INFLUENZA VACCINE  09/19/2023   HEMOGLOBIN A1C  01/31/2024   OPHTHALMOLOGY EXAM  06/08/2024   Diabetic kidney evaluation - eGFR measurement  07/31/2024   Diabetic kidney evaluation - Urine ACR  07/31/2024   FOOT EXAM  07/31/2024   Medicare Annual Wellness (AWV)  08/12/2024   DTaP/Tdap/Td (2 - Td or Tdap) 07/02/2026   Pneumococcal Vaccine: 50+ Years  Completed   Hepatitis B Vaccines  Aged Out   HPV VACCINES  Aged Out   Meningococcal B Vaccine  Aged Out   Hepatitis C Screening  Discontinued   Zoster Vaccines- Shingrix  Discontinued    Health Maintenance  Health Maintenance Due  Topic Date Due   DEXA SCAN  07/23/2023    Health Maintenance Items Addressed: DEXA ordered, See Nurse Notes at the end of this note  Additional Screening:  Vision Screening: Recommended annual ophthalmology exams for early detection of glaucoma and other disorders of the eye. Would you like a referral to an eye doctor? No    Dental Screening: Recommended annual dental exams for proper oral hygiene  Community Resource Referral / Chronic Care Management: CRR required this visit?  No   CCM required this visit?  No   Plan:    I have personally reviewed and noted the following in the patient's chart:   Medical and social history Use of alcohol, tobacco or illicit drugs  Current medications and supplements including opioid prescriptions. Patient is not currently taking opioid prescriptions. Functional ability and  status Nutritional status Physical activity Advanced directives List of other physicians Hospitalizations, surgeries, and ER visits in previous 12 months Vitals Screenings to include cognitive, depression, and falls Referrals and appointments  In addition, I have reviewed and discussed with patient certain preventive protocols, quality metrics, and best practice recommendations. A written personalized care plan for preventive services as well as general preventive health recommendations were provided to patient.   Vina Ned, CMA   08/13/2023   After Visit Summary: (MyChart) Due to this being a telephonic visit, the after visit summary with patients personalized plan was offered to patient via MyChart   Notes:  Placed order for a DEXA scan ([facility] in [place], [place] Declined Covid vaccine

## 2023-09-03 DIAGNOSIS — K080 Exfoliation of teeth due to systemic causes: Secondary | ICD-10-CM | POA: Diagnosis not present

## 2023-09-08 ENCOUNTER — Other Ambulatory Visit: Payer: Self-pay | Admitting: Pharmacist

## 2023-09-08 DIAGNOSIS — E118 Type 2 diabetes mellitus with unspecified complications: Secondary | ICD-10-CM

## 2023-09-08 NOTE — Progress Notes (Signed)
   09/08/2023  Patient ID: Elyn JONETTA Ina, female   DOB: [birth year], 83 y.o.   MRN: [id number]  This patient is appearing on a report for adherence measure for diabetes medications this calendar year.   Medication: metformin  ER 500 mg Last fill date: 08/25/2023 for 90 day supply  Insurance report was not up to date.  Contacted pharmacy to confirm refill picked up on 08/25/2023.  Outreach to patient. Patient denies using weekly pillbox, but describes her own strategy to aid with adherence. Denies missed doses  Discuss importance of medication adherence for blood sugar control.   Sharyle Sia, PharmD, [facility] [phone]

## 2023-09-23 ENCOUNTER — Encounter: Payer: Self-pay | Admitting: Family Medicine

## 2023-09-23 ENCOUNTER — Ambulatory Visit: Payer: Self-pay

## 2023-09-23 ENCOUNTER — Other Ambulatory Visit: Payer: Self-pay | Admitting: Internal Medicine

## 2023-09-23 ENCOUNTER — Ambulatory Visit (INDEPENDENT_AMBULATORY_CARE_PROVIDER_SITE_OTHER): Admitting: Family Medicine

## 2023-09-23 ENCOUNTER — Other Ambulatory Visit (HOSPITAL_COMMUNITY)
Admission: RE | Admit: 2023-09-23 | Discharge: 2023-09-23 | Disposition: A | Discharge status: Home / Self Care | Source: Ambulatory Visit | Attending: Family Medicine | Admitting: Family Medicine

## 2023-09-23 VITALS — BP 122/80 | HR 85 | Ht 61.0 in | Wt 209.0 lb

## 2023-09-23 DIAGNOSIS — N898 Other specified noninflammatory disorders of vagina: Secondary | ICD-10-CM | POA: Diagnosis not present

## 2023-09-23 DIAGNOSIS — M2550 Pain in unspecified joint: Secondary | ICD-10-CM

## 2023-09-23 DIAGNOSIS — K5901 Slow transit constipation: Secondary | ICD-10-CM

## 2023-09-23 LAB — POCT URINALYSIS DIPSTICK
Bilirubin, UA: NEGATIVE
Glucose, UA: POSITIVE — AB
Ketones, UA: POSITIVE
Nitrite, UA: NEGATIVE
Protein, UA: NEGATIVE
Spec Grav, UA: 1.01 (ref 1.010–1.025)
Urobilinogen, UA: 0.2 U/dL
pH, UA: 6.5 (ref 5.0–8.0)

## 2023-09-23 MED ORDER — NITROFURANTOIN MONOHYD MACRO 100 MG PO CAPS: 100.0000 mg | ORAL_CAPSULE | Freq: Two times a day (BID) | ORAL | 0 refills | Status: DC

## 2023-09-23 MED ORDER — TRIAMCINOLONE ACETONIDE 0.1 % EX OINT
TOPICAL_OINTMENT | CUTANEOUS | 0 refills | Status: AC
Start: 2023-09-23 — End: ?

## 2023-09-23 NOTE — Telephone Encounter (Signed)
 Noted  Pt has a appt today.  KP

## 2023-09-23 NOTE — Telephone Encounter (Signed)
 FYI Only or Action Required?: FYI only for provider.  Patient was last seen in primary care on 08/01/2023 by Justus Leita DEL, MD.  Called Nurse Triage reporting Vaginal Itching.  Symptoms began several weeks ago.  Interventions attempted: Nothing.  Symptoms are: gradually worsening.  Triage Disposition: See PCP When Office is Open (Within 3 Days)  Patient/caregiver understands and will follow disposition?: Yes Patient states she is having a terrible yeast infection, itching & its getting worse.  Patient requesting something be sent in for her     Reason for Disposition  [1] Vaginal itching AND [2] not improved > 3 days following Care Advice  Answer Assessment - Initial Assessment Questions 1. SYMPTOM: What's the main symptom you're concerned about? (e.g., pain, itching, dryness)     Vaginal itching and redness  2. LOCATION: Where is the  itching located? (e.g., inside/outside, left/right)     Mainly on outside of labia  3. ONSET: When did the  vaginal symptoms  start?     Been a while  4. PAIN: Is there any pain? If Yes, ask: How bad is it? (Scale: 1-10; mild, moderate, severe)     Mild pain  5. ITCHING: Is there any itching? If Yes, ask: How bad is it? (Scale: 1-10; mild, moderate, severe)     Moderate itching  6. CAUSE: What do you think is causing the discharge? Have you had the same problem before? What happened then?     Concerned for possible yeast infection  7. OTHER SYMPTOMS: Do you have any other symptoms? (e.g., fever, itching, vaginal bleeding, pain with urination, injury to genital area, vaginal foreign body)     Low   8. PREGNANCY: Is there any chance you are pregnant? When was your last menstrual period?     no  Protocols used: Vaginal Symptoms-A-AH

## 2023-09-23 NOTE — Telephone Encounter (Unsigned)
 Copied from CRM [id number]. Topic: Clinical - Medication Refill >> Sep 23, 2023 10:39 AM Tobias L wrote: Medication: oxaprozin  (DAYPRO ) 600 MG tablet Requesting for prescription to be sent to pharmacy below due to insurance.   Has the patient contacted their pharmacy? Yes Told to contact the office.  This is the patient's preferred pharmacy:  [facility],  - [address] [place] [address] Phone: [phone] Fax: [phone]  Is this the correct pharmacy for this prescription? Yes  Has the prescription been filled recently? No  Is the patient out of the medication? No  Has the patient been seen for an appointment in the last year OR does the patient have an upcoming appointment? Yes  Can we respond through MyChart? Yes  Agent: Please be advised that Rx refills may take up to 3 business days. We ask that you follow-up with your pharmacy.

## 2023-09-25 ENCOUNTER — Ambulatory Visit: Payer: Self-pay | Admitting: Family Medicine

## 2023-09-25 LAB — CERVICOVAGINAL ANCILLARY ONLY
Bacterial Vaginitis (gardnerella): NEGATIVE
Candida Glabrata: NEGATIVE
Candida Vaginitis: NEGATIVE
Comment: NEGATIVE
Comment: NEGATIVE
Comment: NEGATIVE

## 2023-09-25 MED ORDER — OXAPROZIN 600 MG PO TABS: 600.0000 mg | ORAL_TABLET | Freq: Two times a day (BID) | ORAL | 0 refills | Status: DC

## 2023-09-25 NOTE — Telephone Encounter (Signed)
 Requested Prescriptions  Pending Prescriptions Disp Refills   oxaprozin  (DAYPRO ) 600 MG tablet 180 tablet 0    Sig: Take 1 tablet (600 mg total) by mouth 2 (two) times daily.     Analgesics:  NSAIDS Failed - 09/25/2023  9:30 AM      Failed - Manual Review: Labs are only required if the patient has taken medication for more than 8 weeks.      Passed - Cr in normal range and within 360 days    Creatinine, Ser  Date Value Ref Range Status  08/01/2023 0.92 0.57 - 1.00 mg/dL Final         Passed - HGB in normal range and within 360 days    Hemoglobin  Date Value Ref Range Status  08/01/2023 14.4 11.1 - 15.9 g/dL Final         Passed - PLT in normal range and within 360 days    Platelets  Date Value Ref Range Status  08/01/2023 230 150 - 450 x10E3/uL Final         Passed - HCT in normal range and within 360 days    Hematocrit  Date Value Ref Range Status  08/01/2023 43.9 34.0 - 46.6 % Final         Passed - eGFR is 30 or above and within 360 days    GFR calc Af Amer  Date Value Ref Range Status  11/16/2019 72 >59 mL/min/1.73 Final    Comment:    **[facility] currently reports eGFR in compliance with the current**   recommendations of the [employer]. [facility] will   update reporting as new guidelines are published from the NKF-ASN   Task force.    GFR calc non Af Amer  Date Value Ref Range Status  11/16/2019 62 >59 mL/min/1.73 Final   eGFR  Date Value Ref Range Status  08/01/2023 63 >59 mL/min/1.73 Final         Passed - Patient is not pregnant      Passed - Valid encounter within last 12 months    Recent Outpatient Visits           2 days ago Vaginal itching   [facility], Kimberly PARAS, Kimberly Walls   1 month ago Annual physical exam   [facility] at [facility], Kimberly DEL, Kimberly Walls   5 months ago Essential (primary) hypertension   [facility], Kimberly DEL, Kimberly Walls       Future Appointments             In 2 months Kimberly Walls, Kimberly DEL, Kimberly Walls [facility] at [facility], [facility]

## 2023-09-26 DIAGNOSIS — K5901 Slow transit constipation: Secondary | ICD-10-CM | POA: Insufficient documentation

## 2023-09-26 DIAGNOSIS — N898 Other specified noninflammatory disorders of vagina: Secondary | ICD-10-CM | POA: Insufficient documentation

## 2023-09-26 NOTE — Assessment & Plan Note (Signed)
 Bowel habits - Chronic constipation managed with Miralax - Daily bowel movements achieved with Miralax - Occasional sensation of incomplete evacuation  Constipation Managed with Miralax, resulting in daily bowel movements. Occasional incomplete evacuation reported. - Continue current Miralax regimen for bowel regularity. - Monitor bowel movement frequency and completeness.

## 2023-09-26 NOTE — Assessment & Plan Note (Signed)
 History of Present Illness Kimberly Walls is an 83 year old female who presents with itching and pain.  Pruritus and erythema - Itching present for the past 7-8 days, initially the primary symptom - Redness developed following onset of itching - Over-the-counter cortisone cream used with partial relief of itching and pain  Urinary symptoms - Mild intermittent dysuria - No changes in urine color or odor - Increased urinary frequency - High daily water intake  Flank discomfort - Soreness in the flanks, more pronounced on the left side - Uncertainty if discomfort is related to kidneys or leg length discrepancy - Left leg is half an inch shorter than the right  Constitutional symptoms - Chills present - Sensation of facial warmth without measured fever  Physical Exam ABDOMEN: Left costovertebral angle tenderness more than right. Bowel sounds normal. Abdomen minimally diffusely tender, no suprapubic tenderness.  Results Urinalysis: Leukocyte esterase 2+ (09/23/2023)  Assessment and Plan Urinary tract infection Leukocytes in urine suggestive of Neri tract infection. - Order urine culture to confirm infection and identify organism. - Prescribe Macrobid  targeting common pathogens including previous urine culture showing Klebsiella. - Advise high fluid intake to aid in flushing urinary tract. - Instruct to report new or worsening symptoms.  Atrophic vaginitis Post-menopausal atrophic vaginitis suspected. Relief with cortisone cream suggests atrophic changes. - Prescribe prescription-grade steroid cream for topical application. - Advise discontinuation if symptoms worsen. - Await urine culture results for additional treatment determination.

## 2023-09-26 NOTE — Progress Notes (Signed)
 Primary Care / Sports Medicine Office Visit  Patient Information:  Patient ID: Kimberly Walls, female DOB: Nov 06, 1940 Age: 83 y.o. MRN: [id number]   Kimberly Walls is a pleasant 83 y.o. female presenting with the following:  Chief Complaint  Patient presents with   Vaginal Itching    Vaginal itching and redness for a few weeks.    Vitals:   09/23/23 1531  BP: 122/80  Pulse: 85  SpO2: 98%   Vitals:   09/23/23 1531  Weight: 209 lb (94.8 kg)  Height: 5' 1 (1.549 m)   Body mass index is 39.49 kg/m.  No results found.   Independent interpretation of notes and tests performed by another provider:   None  Procedures performed:   None  Pertinent History, Exam, Impression, and Recommendations:   Problem List Items Addressed This Visit     Slow transit constipation   Bowel habits - Chronic constipation managed with Miralax - Daily bowel movements achieved with Miralax - Occasional sensation of incomplete evacuation  Constipation Managed with Miralax, resulting in daily bowel movements. Occasional incomplete evacuation reported. - Continue current Miralax regimen for bowel regularity. - Monitor bowel movement frequency and completeness.      Vaginal itching - Primary   History of Present Illness Kimberly Walls is an 83 year old female who presents with itching and pain.  Pruritus and erythema - Itching present for the past 7-8 days, initially the primary symptom - Redness developed following onset of itching - Over-the-counter cortisone cream used with partial relief of itching and pain  Urinary symptoms - Mild intermittent dysuria - No changes in urine color or odor - Increased urinary frequency - High daily water intake  Flank discomfort - Soreness in the flanks, more pronounced on the left side - Uncertainty if discomfort is related to kidneys or leg length discrepancy - Left leg is half an inch shorter than the right  Constitutional  symptoms - Chills present - Sensation of facial warmth without measured fever  Physical Exam ABDOMEN: Left costovertebral angle tenderness more than right. Bowel sounds normal. Abdomen minimally diffusely tender, no suprapubic tenderness.  Results Urinalysis: Leukocyte esterase 2+ (09/23/2023)  Assessment and Plan Urinary tract infection Leukocytes in urine suggestive of Kimberly Walls tract infection. - Order urine culture to confirm infection and identify organism. - Prescribe Macrobid  targeting common pathogens including previous urine culture showing Klebsiella. - Advise high fluid intake to aid in flushing urinary tract. - Instruct to report new or worsening symptoms.  Atrophic vaginitis Post-menopausal atrophic vaginitis suspected. Relief with cortisone cream suggests atrophic changes. - Prescribe prescription-grade steroid cream for topical application. - Advise discontinuation if symptoms worsen. - Await urine culture results for additional treatment determination.      Relevant Medications   nitrofurantoin , macrocrystal-monohydrate, (MACROBID ) 100 MG capsule   triamcinolone  ointment (KENALOG ) 0.1 %   Other Relevant Orders   POCT urinalysis dipstick (Completed)   Cervicovaginal ancillary only (Completed)   Urine Culture     Orders & Medications Medications:  Meds ordered this encounter  Medications   nitrofurantoin , macrocrystal-monohydrate, (MACROBID ) 100 MG capsule    Sig: Take 1 capsule (100 mg total) by mouth 2 (two) times daily.    Dispense:  14 capsule    Refill:  0   triamcinolone  ointment (KENALOG ) 0.1 %    Sig: Apply a thin layer to affected external vulvar skin BID for 7-14 days,then taper to every other day or stop based on response  Dispense:  60 g    Refill:  0   Orders Placed This Encounter  Procedures   Urine Culture   POCT urinalysis dipstick     No follow-ups on file.     Selinda JINNY Ku, MD, [facility]   Primary Care Sports Medicine Primary  Care and Sports Medicine at [facility]

## 2023-09-26 NOTE — Patient Instructions (Signed)
 Patient Plan for Post-Visit Guidance  Constipation  - Continue taking Miralax as currently prescribed to maintain regular bowel movements. - Monitor how often you have bowel movements and if you feel fully emptied.  Urinary Tract Infection  - Take Macrobid  (nitrofurantoin ) as prescribed. - Drink plenty of fluids to help flush your urinary tract. - Urine culture has been ordered; further treatment may be adjusted based on results. - Report any new or worsening symptoms.  Vaginal Itching and Atrophic Vaginitis  - Apply the prescribed steroid cream (triamcinolone ) to the affected area as directed. - Stop using the cream if symptoms worsen. - Await urine culture results for any additional treatment needs.  Red Flags  - If you develop fever, severe back or abdominal pain, vomiting, confusion, blood in urine, or if symptoms worsen or do not improve, seek medical attention promptly.

## 2023-09-27 LAB — URINE CULTURE

## 2023-11-04 ENCOUNTER — Other Ambulatory Visit: Payer: Self-pay | Admitting: Internal Medicine

## 2023-11-05 NOTE — Telephone Encounter (Signed)
 Requested Prescriptions  Pending Prescriptions Disp Refills   omeprazole  (PRILOSEC) 20 MG capsule [Pharmacy Med Name: OMEPRAZOLE  DR 20 MG CAP] 90 capsule 1    Sig: TAKE ONE CAPSULE BY MOUTH DAILY     Gastroenterology: Proton Pump Inhibitors Passed - 11/05/2023  2:50 PM      Passed - Valid encounter within last 12 months    Recent Outpatient Visits           1 month ago Vaginal itching   [place] [facility], Selinda PARAS, MD   3 months ago Annual physical exam   [facility] at [facility], Leita DEL, MD   7 months ago Essential (primary) hypertension   [place] [facility] at [facility], Leita DEL, MD       Future Appointments             In 4 weeks Justus Leita DEL, MD [facility] Primary Care & Sports Medicine at [facility], [phone]             levocetirizine (XYZAL ) 5 MG tablet [Pharmacy Med Name: LEVOCETIRIZINE DIHYDROCHLORIDE  5 MG] 90 tablet 0    Sig: TAKE 1 TABLET BY MOUTH EVERY EVENING     Ear, Nose, and Throat:  Antihistamines - levocetirizine dihydrochloride  Passed - 11/05/2023  2:50 PM      Passed - Cr in normal range and within 360 days    Creatinine, Ser  Date Value Ref Range Status  08/01/2023 0.92 0.57 - 1.00 mg/dL Final         Passed - eGFR is 10 or above and within 360 days    GFR calc Af Amer  Date Value Ref Range Status  11/16/2019 72 >59 mL/min/1.73 Final    Comment:    **[facility] currently reports eGFR in compliance with the current**   recommendations of the [employer]. [facility] will   update reporting as new guidelines are published from the NKF-ASN   Task force.    GFR calc non Af Amer  Date Value Ref Range Status  11/16/2019 62 >59 mL/min/1.73 Final   eGFR  Date Value Ref Range Status  08/01/2023 63 >59 mL/min/1.73 Final         Passed - Valid encounter within last 12 months    Recent  Outpatient Visits           1 month ago Vaginal itching    Primary Care & Sports Medicine at [facility], Selinda PARAS, MD   3 months ago Annual physical exam   [facility] Medicine at [facility], Leita DEL, MD   7 months ago Essential (primary) hypertension    Primary Care & Sports Medicine at [facility], Leita DEL, MD       Future Appointments             In 4 weeks Justus Leita DEL, MD [facility] Primary Care & Sports Medicine at [facility], [phone] [address]

## 2023-11-06 ENCOUNTER — Telehealth: Payer: Self-pay | Admitting: Pharmacy Technician

## 2023-11-06 ENCOUNTER — Other Ambulatory Visit (HOSPITAL_COMMUNITY): Payer: Self-pay

## 2023-11-06 ENCOUNTER — Telehealth: Payer: Self-pay

## 2023-11-06 NOTE — Telephone Encounter (Signed)
 Copied from CRM [id number]. Topic: Clinical - Medication Prior Auth >> Nov 06, 2023  3:26 PM [name] wrote: Reason for CRM: Kimberly Walls with [facility] [employer] calling to advise PA for Oxaprozin  has been denied for coverage. It looks like patient has alternatives to be tried:  Celoxib Diclofenac  Etodolac Flurbiprofren  Ibupfroen Meloxicam Neproxan  Will be faxing details to office as well.   Patient needing to try at least 3 of these.

## 2023-11-06 NOTE — Telephone Encounter (Signed)
 Pharmacy Patient Advocate Encounter  Received notification from [facility] that Prior Authorization for Oxaprozin  600MG  tablets  has been DENIED.  Full denial letter will be uploaded to the media tab. See denial reason below.     PA #/Case ID/Reference #: [id number]

## 2023-11-06 NOTE — Telephone Encounter (Signed)
 Pharmacy Patient Advocate Encounter   Received notification from Onbase that prior authorization for Oxaprozin  600MG  tablets  is required/requested.   Insurance verification completed.   The patient is insured through [facility] .   Per test claim: PA required; PA submitted to above mentioned insurance via Latent Key/confirmation #/EOC [id number] Status is pending

## 2023-11-07 ENCOUNTER — Other Ambulatory Visit: Payer: Self-pay | Admitting: Internal Medicine

## 2023-11-07 DIAGNOSIS — M2550 Pain in unspecified joint: Secondary | ICD-10-CM

## 2023-11-07 MED ORDER — CELECOXIB 100 MG PO CAPS: 100.0000 mg | ORAL_CAPSULE | Freq: Two times a day (BID) | ORAL | 0 refills | Status: AC

## 2023-11-07 NOTE — Telephone Encounter (Signed)
 Noted  KP

## 2023-11-07 NOTE — Progress Notes (Unsigned)
 Date:  11/07/2023   Name:  Kimberly Walls   DOB:  07/04/1940   MRN:  [id number]   Chief Complaint: No chief complaint on file.  HPI  Review of Systems   Lab Results  Component Value Date   NA 136 08/01/2023   K 4.7 08/01/2023   CO2 22 08/01/2023   GLUCOSE 132 (H) 08/01/2023   BUN 22 08/01/2023   CREATININE 0.92 08/01/2023   CALCIUM  9.5 08/01/2023   EGFR 63 08/01/2023   GFRNONAA 62 11/16/2019   Lab Results  Component Value Date   CHOL 154 08/01/2023   HDL 41 08/01/2023   LDLCALC 74 08/01/2023   TRIG 238 (H) 08/01/2023   CHOLHDL 3.8 08/01/2023   Lab Results  Component Value Date   TSH 2.530 08/01/2023   Lab Results  Component Value Date   HGBA1C 7.1 (H) 08/01/2023   Lab Results  Component Value Date   WBC 6.6 08/01/2023   HGB 14.4 08/01/2023   HCT 43.9 08/01/2023   MCV 88 08/01/2023   PLT 230 08/01/2023   Lab Results  Component Value Date   ALT 15 08/01/2023   AST 27 08/01/2023   ALKPHOS 99 08/01/2023   BILITOT 0.4 08/01/2023   No results found for: MARIEN BOLLS, VD25OH   Patient Active Problem List   Diagnosis Date Noted   Vaginal itching 09/26/2023   Slow transit constipation 09/26/2023   Vertigo 08/01/2023   CKD stage 3a, GFR 45-59 ml/min (HCC) 04/25/2023   Obesity, morbid (HCC) 03/31/2023   Tinnitus aurium, bilateral 03/31/2023   Arthralgia 07/27/2021   Aortic stenosis 11/06/2020   Atypical chest pain 09/12/2020   BMI 38.0-38.9,adult 06/29/2015   History of pulmonary embolus (PE) 06/29/2015   DM type 2, controlled, with complication (HCC) 03/06/2015   Arthritis, degenerative 12/05/2014   DDD (degenerative disc disease), lumbar 08/29/2014   Hyperlipidemia associated with type 2 diabetes mellitus (HCC) 08/29/2014   Essential (primary) hypertension 08/29/2014   Dyspepsia 08/29/2014   Herpes 08/29/2014   Obstructive sleep apnea of adult 08/29/2014   Depression, major, recurrent, in partial remission (HCC) 08/29/2014    Headache, tension-type 08/29/2014   History of colon polyps 02/19/2008   Personal history of malignant neoplasm of breast 02/19/1991    Allergies  Allergen Reactions   Prochlorperazine    Pneumococcal Vaccine Rash   Tetanus Toxoid-Containing Vaccines Rash    Past Surgical History:  Procedure Laterality Date   BUNIONECTOMY Bilateral    CARPAL TUNNEL RELEASE Left    LUMBAR LAMINECTOMY     MASTECTOMY MODIFIED RADICAL Bilateral 1993   PARTIAL HYSTERECTOMY     TOTAL KNEE ARTHROPLASTY Bilateral    TOTAL SHOULDER REPLACEMENT Bilateral     Social History   Tobacco Use   Smoking status: Never   Smokeless tobacco: Never  Vaping Use   Vaping status: Never Used  Substance Use Topics   Alcohol use: Yes    Comment: social, 1-2 white russians every 2 weeks   Drug use: No     Medication list has been reviewed and updated.  No outpatient medications have been marked as taking for the 11/07/23 encounter (Orders Only) with Justus Leita DEL, MD.       08/01/2023    9:53 AM 03/31/2023    1:18 PM 11/26/2022   10:46 AM 07/31/2022    9:47 AM  GAD 7 : Generalized Anxiety Score  Nervous, Anxious, on Edge 0 0 2 1  Control/stop worrying 2 0 2 1  Worry too much - different things 1 1 2 1   Trouble relaxing 0  2 0  Restless 0 0 2 0  Easily annoyed or irritable 0 0 1 0  Afraid - awful might happen 0 0 1 0  Total GAD 7 Score 3  12 3   Anxiety Difficulty Somewhat difficult Not difficult at all Somewhat difficult Somewhat difficult       08/13/2023   11:02 AM 08/01/2023    9:52 AM 03/31/2023    1:18 PM  Depression screen PHQ 2/9  Decreased Interest 0 0 0  Down, Depressed, Hopeless 0 0 1  PHQ - 2 Score 0 0 1  Altered sleeping 0 1 2  Tired, decreased energy 1 1 1   Change in appetite 0 0 1  Feeling bad or failure about yourself  0 0 0  Trouble concentrating 0 0 1  Moving slowly or fidgety/restless 0 0 0  Suicidal thoughts 0 0 0  PHQ-9 Score 1 2 6   Difficult doing work/chores Not  difficult at all Not difficult at all Not difficult at all    BP Readings from Last 3 Encounters:  09/23/23 122/80  08/01/23 126/78  03/31/23 122/78    Physical Exam  Wt Readings from Last 3 Encounters:  09/23/23 209 lb (94.8 kg)  08/13/23 205 lb (93 kg)  08/01/23 205 lb 8 oz (93.2 kg)    There were no vitals taken for this visit.  Assessment and Plan:  Problem List Items Addressed This Visit   None   No follow-ups on file.    Leita HILARIO Adie, MD [facility] Primary Care and Sports Medicine [place]

## 2023-11-07 NOTE — Telephone Encounter (Signed)
 Pt would like to try another medication. Wants it sent to [facility].  KP

## 2023-12-02 ENCOUNTER — Other Ambulatory Visit: Payer: Self-pay | Admitting: Internal Medicine

## 2023-12-02 NOTE — Progress Notes (Unsigned)
 Date:  12/02/2023   Name:  Kimberly Walls   DOB:  [birth year]   MRN:  [id number]   Chief Complaint: No chief complaint on file.  HPI  Review of Systems   Lab Results  Component Value Date   NA 136 08/01/2023   K 4.7 08/01/2023   CO2 22 08/01/2023   GLUCOSE 132 (H) 08/01/2023   BUN 22 08/01/2023   CREATININE 0.92 08/01/2023   CALCIUM  9.5 08/01/2023   EGFR 63 08/01/2023   GFRNONAA 62 11/16/2019   Lab Results  Component Value Date   CHOL 154 08/01/2023   HDL 41 08/01/2023   LDLCALC 74 08/01/2023   TRIG 238 (H) 08/01/2023   CHOLHDL 3.8 08/01/2023   Lab Results  Component Value Date   TSH 2.530 08/01/2023   Lab Results  Component Value Date   HGBA1C 7.1 (H) 08/01/2023   Lab Results  Component Value Date   WBC 6.6 08/01/2023   HGB 14.4 08/01/2023   HCT 43.9 08/01/2023   MCV 88 08/01/2023   PLT 230 08/01/2023   Lab Results  Component Value Date   ALT 15 08/01/2023   AST 27 08/01/2023   ALKPHOS 99 08/01/2023   BILITOT 0.4 08/01/2023   No results found for: MARIEN BOLLS, VD25OH   Patient Active Problem List   Diagnosis Date Noted   Vaginal itching 09/26/2023   Slow transit constipation 09/26/2023   Vertigo 08/01/2023   CKD stage 3a, GFR 45-59 ml/min (HCC) 04/25/2023   Obesity, morbid (HCC) 03/31/2023   Tinnitus aurium, bilateral 03/31/2023   Arthralgia 07/27/2021   Aortic stenosis 11/06/2020   Atypical chest pain 09/12/2020   BMI 38.0-38.9,adult 06/29/2015   History of pulmonary embolus (PE) 06/29/2015   DM type 2, controlled, with complication (HCC) 03/06/2015   Arthritis, degenerative 12/05/2014   DDD (degenerative disc disease), lumbar 08/29/2014   Hyperlipidemia associated with type 2 diabetes mellitus (HCC) 08/29/2014   Essential (primary) hypertension 08/29/2014   Dyspepsia 08/29/2014   Herpes 08/29/2014   Obstructive sleep apnea of adult 08/29/2014   Depression, major, recurrent, in partial remission 08/29/2014   Headache,  tension-type 08/29/2014   History of colon polyps 02/19/2008   Personal history of malignant neoplasm of breast 02/19/1991    Allergies  Allergen Reactions   Prochlorperazine    Pneumococcal Vaccine Rash   Tetanus Toxoid-Containing Vaccines Rash    Past Surgical History:  Procedure Laterality Date   BUNIONECTOMY Bilateral    CARPAL TUNNEL RELEASE Left    LUMBAR LAMINECTOMY     MASTECTOMY MODIFIED RADICAL Bilateral 1993   PARTIAL HYSTERECTOMY     TOTAL KNEE ARTHROPLASTY Bilateral    TOTAL SHOULDER REPLACEMENT Bilateral     Social History   Tobacco Use   Smoking status: Never   Smokeless tobacco: Never  Vaping Use   Vaping status: Never Used  Substance Use Topics   Alcohol use: Yes    Comment: social, 1-2 white russians every 2 weeks   Drug use: No     Medication list has been reviewed and updated.  No outpatient medications have been marked as taking for the 12/02/23 encounter (Orders Only) with Justus Leita DEL, MD.       08/01/2023    9:53 AM 03/31/2023    1:18 PM 11/26/2022   10:46 AM 07/31/2022    9:47 AM  GAD 7 : Generalized Anxiety Score  Nervous, Anxious, on Edge 0 0 2 1  Control/stop worrying 2 0 2 1  Worry too much - different things 1 1 2 1   Trouble relaxing 0  2 0  Restless 0 0 2 0  Easily annoyed or irritable 0 0 1 0  Afraid - awful might happen 0 0 1 0  Total GAD 7 Score 3  12 3   Anxiety Difficulty Somewhat difficult Not difficult at all Somewhat difficult Somewhat difficult       08/13/2023   11:02 AM 08/01/2023    9:52 AM 03/31/2023    1:18 PM  Depression screen PHQ 2/9  Decreased Interest 0 0 0  Down, Depressed, Hopeless 0 0 1  PHQ - 2 Score 0 0 1  Altered sleeping 0 1 2  Tired, decreased energy 1 1 1   Change in appetite 0 0 1  Feeling bad or failure about yourself  0 0 0  Trouble concentrating 0 0 1  Moving slowly or fidgety/restless 0 0 0  Suicidal thoughts 0 0 0  PHQ-9 Score 1 2 6   Difficult doing work/chores Not difficult at  all Not difficult at all Not difficult at all    BP Readings from Last 3 Encounters:  09/23/23 122/80  08/01/23 126/78  03/31/23 122/78    Physical Exam  Wt Readings from Last 3 Encounters:  09/23/23 209 lb (94.8 kg)  08/13/23 205 lb (93 kg)  08/01/23 205 lb 8 oz (93.2 kg)    There were no vitals taken for this visit.  Assessment and Plan:  Problem List Items Addressed This Visit   None   No follow-ups on file.    Leita HILARIO Adie, MD [facility] Primary Care and Sports Medicine [place]

## 2023-12-03 ENCOUNTER — Encounter: Payer: Self-pay | Admitting: Internal Medicine

## 2023-12-03 ENCOUNTER — Ambulatory Visit (INDEPENDENT_AMBULATORY_CARE_PROVIDER_SITE_OTHER): Admitting: Internal Medicine

## 2023-12-03 VITALS — BP 132/78 | HR 95 | Ht 61.0 in | Wt 206.0 lb

## 2023-12-03 DIAGNOSIS — Z7984 Long term (current) use of oral hypoglycemic drugs: Secondary | ICD-10-CM | POA: Diagnosis not present

## 2023-12-03 DIAGNOSIS — E118 Type 2 diabetes mellitus with unspecified complications: Secondary | ICD-10-CM | POA: Diagnosis not present

## 2023-12-03 DIAGNOSIS — I10 Essential (primary) hypertension: Secondary | ICD-10-CM

## 2023-12-03 DIAGNOSIS — I350 Nonrheumatic aortic (valve) stenosis: Secondary | ICD-10-CM

## 2023-12-03 DIAGNOSIS — Z23 Encounter for immunization: Secondary | ICD-10-CM | POA: Diagnosis not present

## 2023-12-03 LAB — POCT GLYCOSYLATED HEMOGLOBIN (HGB A1C): Hemoglobin A1C: 6.8 % — AB (ref 4.0–5.6)

## 2023-12-03 MED ORDER — JARDIANCE 10 MG PO TABS: 10.0000 mg | ORAL_TABLET | Freq: Every day | ORAL | 1 refills | Status: AC

## 2023-12-03 MED ORDER — METFORMIN HCL ER 500 MG PO TB24: 500.0000 mg | ORAL_TABLET | Freq: Two times a day (BID) | ORAL | 1 refills | Status: AC

## 2023-12-03 NOTE — Assessment & Plan Note (Addendum)
 Fairly well controlled blood pressure today. Current regimen is amlodipine , lisinopril  and Coreg. No medication side effects noted. Would not adjust dose due to age and risk of hypotension. Continue to monitor; she sees Cardiology next month

## 2023-12-03 NOTE — Progress Notes (Signed)
 Date:  12/03/2023   Name:  Kimberly Walls   DOB:  May 26, 1940   MRN:  [id number]   Chief Complaint: Hypertension and Diabetes  Diabetes She presents for her follow-up diabetic visit. She has type 2 diabetes mellitus. Her disease course has been stable. Pertinent negatives for hypoglycemia include no dizziness, headaches or nervousness/anxiousness. Pertinent negatives for diabetes include no chest pain, no fatigue and no weakness.  Hypertension This is a chronic problem. The problem is controlled. Associated symptoms include shortness of breath (when carrying items or walking distances). Pertinent negatives include no chest pain, headaches or palpitations. Past treatments include beta blockers, calcium  channel blockers and ACE inhibitors.    Review of Systems  Constitutional:  Negative for fatigue and unexpected weight change.  HENT:  Negative for trouble swallowing.   Eyes:  Negative for visual disturbance.  Respiratory:  Positive for shortness of breath (when carrying items or walking distances). Negative for cough, chest tightness and wheezing.   Cardiovascular:  Negative for chest pain, palpitations and leg swelling.  Gastrointestinal:  Negative for abdominal pain, constipation and diarrhea.  Musculoskeletal:  Negative for arthralgias and myalgias.  Neurological:  Negative for dizziness, weakness, light-headedness and headaches.  Psychiatric/Behavioral:  Negative for dysphoric mood and sleep disturbance. The patient is not nervous/anxious.      Lab Results  Component Value Date   NA 136 08/01/2023   K 4.7 08/01/2023   CO2 22 08/01/2023   GLUCOSE 132 (H) 08/01/2023   BUN 22 08/01/2023   CREATININE 0.92 08/01/2023   CALCIUM  9.5 08/01/2023   EGFR 63 08/01/2023   GFRNONAA 62 11/16/2019   Lab Results  Component Value Date   CHOL 154 08/01/2023   HDL 41 08/01/2023   LDLCALC 74 08/01/2023   TRIG 238 (H) 08/01/2023   CHOLHDL 3.8 08/01/2023   Lab Results  Component Value Date    TSH 2.530 08/01/2023   Lab Results  Component Value Date   HGBA1C 6.8 (A) 12/03/2023   Lab Results  Component Value Date   WBC 6.6 08/01/2023   HGB 14.4 08/01/2023   HCT 43.9 08/01/2023   MCV 88 08/01/2023   PLT 230 08/01/2023   Lab Results  Component Value Date   ALT 15 08/01/2023   AST 27 08/01/2023   ALKPHOS 99 08/01/2023   BILITOT 0.4 08/01/2023   No results found for: MARIEN BOLLS, VD25OH   Patient Active Problem List   Diagnosis Date Noted   Vaginal itching 09/26/2023   Slow transit constipation 09/26/2023   Vertigo 08/01/2023   CKD stage 3a, GFR 45-59 ml/min (HCC) 04/25/2023   Obesity, morbid (HCC) 03/31/2023   Tinnitus aurium, bilateral 03/31/2023   Arthralgia 07/27/2021   Aortic stenosis 11/06/2020   BMI 38.0-38.9,adult 06/29/2015   History of pulmonary embolus (PE) 06/29/2015   DM type 2, controlled, with complication (HCC) 03/06/2015   Arthritis, degenerative 12/05/2014   DDD (degenerative disc disease), lumbar 08/29/2014   Hyperlipidemia associated with type 2 diabetes mellitus (HCC) 08/29/2014   Essential (primary) hypertension 08/29/2014   Dyspepsia 08/29/2014   Herpes 08/29/2014   Obstructive sleep apnea of adult 08/29/2014   Depression, major, recurrent, in partial remission 08/29/2014   Headache, tension-type 08/29/2014   History of colon polyps 02/19/2008   Personal history of malignant neoplasm of breast 02/19/1991    Allergies  Allergen Reactions   Prochlorperazine    Pneumococcal Vaccine Rash   Tetanus Toxoid-Containing Vaccines Rash    Past Surgical History:  Procedure Laterality  Date   BUNIONECTOMY Bilateral    CARPAL TUNNEL RELEASE Left    LUMBAR LAMINECTOMY     MASTECTOMY MODIFIED RADICAL Bilateral 1993   PARTIAL HYSTERECTOMY     TOTAL KNEE ARTHROPLASTY Bilateral    TOTAL SHOULDER REPLACEMENT Bilateral     Social History   Tobacco Use   Smoking status: Never   Smokeless tobacco: Never  Vaping Use    Vaping status: Never Used  Substance Use Topics   Alcohol use: Yes    Comment: social, 1-2 white russians every 2 weeks   Drug use: No     Medication list has been reviewed and updated.  Current Meds  Medication Sig   amLODipine  (NORVASC ) 5 MG tablet Take 1 tablet (5 mg total) by mouth daily.   ascorbic acid (VITAMIN C) 250 MG tablet Take 250 mg by mouth daily.   aspirin 81 MG tablet Take 1 tablet by mouth daily.   atorvastatin  (LIPITOR) 10 MG tablet TAKE ONE TABLET BY MOUTH ONCE DAILY   Biotin 2500 MCG CAPS Take 5,000 mcg by mouth daily.   Calcium  Carb-Cholecalciferol (CALCIUM  + D3) 600-200 MG-UNIT TABS Take 1 tablet by mouth daily.   carvedilol (COREG) 6.25 MG tablet Take 6.25 mg by mouth 2 (two) times daily.   celecoxib  (CELEBREX ) 100 MG capsule Take 1 capsule (100 mg total) by mouth 2 (two) times daily.   Cinnamon 500 MG TABS Take 2 tablets by mouth daily.   Coenzyme Q10 (COQ10) 200 MG CAPS Take 1 capsule by mouth daily.   Cyanocobalamin (VITAMIN B-12 PO) Take 3,000 mcg by mouth daily.   Elderberry 500 MG CAPS Take by mouth.   escitalopram  (LEXAPRO ) 10 MG tablet TAKE ONE TABLET BY MOUTH ONCE DAILY   fluticasone (FLONASE) 50 MCG/ACT nasal spray Place 2 sprays into both nostrils daily.   glucose blood test strip Use to test BS twice a day   Homeopathic Products (ARNICARE) GEL Apply topically daily.   levocetirizine (XYZAL ) 5 MG tablet TAKE 1 TABLET BY MOUTH EVERY EVENING   lisinopril  (ZESTRIL ) 40 MG tablet Take 1 tablet (40 mg total) by mouth daily.   Multiple Vitamin (MULTIVITAMIN) capsule Take 1 capsule by mouth daily.   nitrofurantoin , macrocrystal-monohydrate, (MACROBID ) 100 MG capsule Take 1 capsule (100 mg total) by mouth 2 (two) times daily.   nystatin  cream (MYCOSTATIN ) Apply topically 2 (two) times daily. For rash under breast and abdomen   omeprazole  (PRILOSEC) 20 MG capsule TAKE ONE CAPSULE BY MOUTH DAILY   Probiotic Product (PROBIOTIC PO) Take by mouth.    triamcinolone  ointment (KENALOG ) 0.1 % Apply a thin layer to affected external vulvar skin BID for 7-14 days,then taper to every other day or stop based on response   zinc gluconate 50 MG tablet Take 50 mg by mouth daily.   [DISCONTINUED] JARDIANCE  10 MG TABS tablet Take 1 tablet (10 mg total) by mouth daily.   [DISCONTINUED] metFORMIN  (GLUCOPHAGE -XR) 500 MG 24 hr tablet Take 1 tablet (500 mg total) by mouth 2 (two) times daily.       12/03/2023   10:03 AM 08/01/2023    9:53 AM 03/31/2023    1:18 PM 11/26/2022   10:46 AM  GAD 7 : Generalized Anxiety Score  Nervous, Anxious, on Edge 1 0 0 2  Control/stop worrying 1 2 0 2  Worry too much - different things 1 1 1 2   Trouble relaxing 0 0  2  Restless 0 0 0 2  Easily annoyed or irritable 0  0 0 1  Afraid - awful might happen 0 0 0 1  Total GAD 7 Score 3 3  12   Anxiety Difficulty Not difficult at all Somewhat difficult Not difficult at all Somewhat difficult       12/03/2023   10:03 AM 08/13/2023   11:02 AM 08/01/2023    9:52 AM  Depression screen PHQ 2/9  Decreased Interest 0 0 0  Down, Depressed, Hopeless 0 0 0  PHQ - 2 Score 0 0 0  Altered sleeping 0 0 1  Tired, decreased energy 0 1 1  Change in appetite 0 0 0  Feeling bad or failure about yourself  0 0 0  Trouble concentrating 0 0 0  Moving slowly or fidgety/restless 0 0 0  Suicidal thoughts 0 0 0  PHQ-9 Score 0 1 2  Difficult doing work/chores Not difficult at all Not difficult at all Not difficult at all    BP Readings from Last 3 Encounters:  12/03/23 132/78  09/23/23 122/80  08/01/23 126/78    Physical Exam Vitals and nursing note reviewed.  Constitutional:      General: She is not in acute distress.    Appearance: She is well-developed.  HENT:     Head: Normocephalic and atraumatic.  Neck:     Vascular: No carotid bruit.  Cardiovascular:     Rate and Rhythm: Normal rate and regular rhythm.  Pulmonary:     Effort: Pulmonary effort is normal. No respiratory  distress.     Breath sounds: No wheezing or rhonchi.  Musculoskeletal:     Cervical back: Normal range of motion.     Right lower leg: No edema.     Left lower leg: No edema.  Lymphadenopathy:     Cervical: No cervical adenopathy.  Skin:    General: Skin is warm and dry.     Capillary Refill: Capillary refill takes less than 2 seconds.     Findings: No rash.  Neurological:     General: No focal deficit present.     Mental Status: She is alert and oriented to person, place, and time.  Psychiatric:        Mood and Affect: Mood normal.        Behavior: Behavior normal.     Wt Readings from Last 3 Encounters:  12/03/23 206 lb (93.4 kg)  09/23/23 209 lb (94.8 kg)  08/13/23 205 lb (93 kg)    BP 132/78   Pulse 95   Ht 5' 1 (1.549 m)   Wt 206 lb (93.4 kg)   SpO2 99%   BMI 38.92 kg/m   Assessment and Plan:  Problem List Items Addressed This Visit       Unprioritized   Essential (primary) hypertension (Chronic)   Fairly well controlled blood pressure today. Current regimen is amlodipine , lisinopril  and Coreg. No medication side effects noted. Would not adjust dose due to age and risk of hypotension. Continue to monitor; she sees Cardiology next month        DM type 2, controlled, with complication (HCC) - Primary (Chronic)   Currently medications are MTF and Jardiance .  No hypoglycemic episodes noted. Home blood sugars in the 140 range. Last visit medical regimen changes were none. Lab Results  Component Value Date   HGBA1C 7.1 (H) 08/01/2023   A1C today = 6.8      Relevant Medications   metFORMIN  (GLUCOPHAGE -XR) 500 MG 24 hr tablet   JARDIANCE  10 MG TABS tablet   Other  Relevant Orders   POCT HgB A1C (Completed)   Aortic stenosis   Some increase in DOE without chest pain. Due to see cardiology for AS and HTN follow up - recommend she call for an earlier appointment. No change in medication for now.      Other Visit Diagnoses       Long term current  use of oral hypoglycemic drug         Encounter for immunization       Relevant Orders   Flu vaccine HIGH DOSE PF(Fluzone Trivalent) (Completed)       Return in about 4 months (around 04/04/2024) for TOC  DM, HTN  Dr. MARLA (11AM appt).    Leita HILARIO Adie, MD [facility] [place]

## 2023-12-03 NOTE — Assessment & Plan Note (Addendum)
 Currently medications are MTF and Jardiance .  No hypoglycemic episodes noted. Home blood sugars in the 140 range. Last visit medical regimen changes were none. Lab Results  Component Value Date   HGBA1C 7.1 (H) 08/01/2023   A1C today = 6.8

## 2023-12-03 NOTE — Assessment & Plan Note (Signed)
 Some increase in DOE without chest pain. Due to see cardiology for AS and HTN follow up - recommend she call for an earlier appointment. No change in medication for now.

## 2023-12-15 ENCOUNTER — Ambulatory Visit (INDEPENDENT_AMBULATORY_CARE_PROVIDER_SITE_OTHER): Admitting: Internal Medicine

## 2023-12-15 ENCOUNTER — Encounter: Payer: Self-pay | Admitting: Internal Medicine

## 2023-12-15 VITALS — BP 126/76 | HR 93 | Ht 61.0 in | Wt 205.0 lb

## 2023-12-15 DIAGNOSIS — R1013 Epigastric pain: Secondary | ICD-10-CM

## 2023-12-15 DIAGNOSIS — R350 Frequency of micturition: Secondary | ICD-10-CM

## 2023-12-15 DIAGNOSIS — N761 Subacute and chronic vaginitis: Secondary | ICD-10-CM

## 2023-12-15 LAB — POCT URINALYSIS DIPSTICK
Bilirubin, UA: NEGATIVE
Blood, UA: NEGATIVE
Glucose, UA: NEGATIVE
Ketones, UA: NEGATIVE
Nitrite, UA: NEGATIVE
Protein, UA: NEGATIVE
Spec Grav, UA: 1.02 (ref 1.010–1.025)
Urobilinogen, UA: 0.2 U/dL
pH, UA: 5 (ref 5.0–8.0)

## 2023-12-15 MED ORDER — OMEPRAZOLE 20 MG PO CPDR: 20.0000 mg | DELAYED_RELEASE_CAPSULE | Freq: Two times a day (BID) | ORAL | 0 refills | Status: DC

## 2023-12-15 MED ORDER — FLUCONAZOLE 100 MG PO TABS: 100.0000 mg | ORAL_TABLET | Freq: Once | ORAL | 0 refills | Status: AC

## 2023-12-15 NOTE — Progress Notes (Signed)
 Date:  12/15/2023   Name:  Kimberly Walls   DOB:  1940-05-28   MRN:  [id number]   Chief Complaint: Dysuria (Burning pain, urinary frequency. Pt said urine has been different since starting jardiance .)  Dysuria  This is a new problem. The quality of the pain is described as burning. The pain is at a severity of 1/10. The pain is mild. There has been no fever. Pertinent negatives include no chills. She has tried increased fluids for the symptoms.  Gastroesophageal Reflux She complains of abdominal pain (acid reflux) and heartburn. She reports no chest pain, no choking, no coughing, no dysphagia or no sore throat. The heartburn is located in the substernum. The heartburn is of mild intensity. The heartburn does not wake her from sleep. The symptoms are aggravated by certain foods. Pertinent negatives include no fatigue. There are no known risk factors. She has tried a PPI for the symptoms.  Vaginal Itching The patient's primary symptoms include genital itching. The patient's pertinent negatives include no genital lesions, genital odor, vaginal bleeding or vaginal discharge. This is a chronic problem. The current episode started more than 1 month ago. The problem has been gradually improving. The patient is experiencing no pain. Associated symptoms include abdominal pain (acid reflux) and dysuria (mild vaginal itching still). Pertinent negatives include no chills or sore throat.    Review of Systems  Constitutional:  Negative for chills and fatigue.  HENT:  Negative for sore throat.   Respiratory:  Negative for cough and choking.   Cardiovascular:  Negative for chest pain and palpitations.  Gastrointestinal:  Positive for abdominal pain (acid reflux) and heartburn. Negative for abdominal distention and dysphagia.  Genitourinary:  Positive for dysuria (mild vaginal itching still). Negative for vaginal discharge.  Psychiatric/Behavioral:  Negative for dysphoric mood and sleep disturbance. The  patient is not nervous/anxious.      Lab Results  Component Value Date   NA 136 08/01/2023   K 4.7 08/01/2023   CO2 22 08/01/2023   GLUCOSE 132 (H) 08/01/2023   BUN 22 08/01/2023   CREATININE 0.92 08/01/2023   CALCIUM  9.5 08/01/2023   EGFR 63 08/01/2023   GFRNONAA 62 11/16/2019   Lab Results  Component Value Date   CHOL 154 08/01/2023   HDL 41 08/01/2023   LDLCALC 74 08/01/2023   TRIG 238 (H) 08/01/2023   CHOLHDL 3.8 08/01/2023   Lab Results  Component Value Date   TSH 2.530 08/01/2023   Lab Results  Component Value Date   HGBA1C 6.8 (A) 12/03/2023   Lab Results  Component Value Date   WBC 6.6 08/01/2023   HGB 14.4 08/01/2023   HCT 43.9 08/01/2023   MCV 88 08/01/2023   PLT 230 08/01/2023   Lab Results  Component Value Date   ALT 15 08/01/2023   AST 27 08/01/2023   ALKPHOS 99 08/01/2023   BILITOT 0.4 08/01/2023   No results found for: MARIEN BOLLS, VD25OH   Patient Active Problem List   Diagnosis Date Noted   Vaginal itching 09/26/2023   Slow transit constipation 09/26/2023   Vertigo 08/01/2023   CKD stage 3a, GFR 45-59 ml/min (HCC) 04/25/2023   Obesity, morbid (HCC) 03/31/2023   Tinnitus aurium, bilateral 03/31/2023   Arthralgia 07/27/2021   Aortic stenosis 11/06/2020   BMI 38.0-38.9,adult 06/29/2015   History of pulmonary embolus (PE) 06/29/2015   DM type 2, controlled, with complication (HCC) 03/06/2015   Arthritis, degenerative 12/05/2014   DDD (degenerative disc disease), lumbar  08/29/2014   Hyperlipidemia associated with type 2 diabetes mellitus (HCC) 08/29/2014   Essential (primary) hypertension 08/29/2014   Dyspepsia 08/29/2014   Herpes 08/29/2014   Obstructive sleep apnea of adult 08/29/2014   Depression, major, recurrent, in partial remission 08/29/2014   Headache, tension-type 08/29/2014   History of colon polyps 02/19/2008   Personal history of malignant neoplasm of breast 02/19/1991    Allergies  Allergen Reactions    Prochlorperazine    Pneumococcal Vaccine Rash   Tetanus Toxoid-Containing Vaccines Rash    Past Surgical History:  Procedure Laterality Date   BUNIONECTOMY Bilateral    CARPAL TUNNEL RELEASE Left    LUMBAR LAMINECTOMY     MASTECTOMY MODIFIED RADICAL Bilateral 1993   PARTIAL HYSTERECTOMY     TOTAL KNEE ARTHROPLASTY Bilateral    TOTAL SHOULDER REPLACEMENT Bilateral     Social History   Tobacco Use   Smoking status: Never   Smokeless tobacco: Never  Vaping Use   Vaping status: Never Used  Substance Use Topics   Alcohol use: Yes    Comment: social, 1-2 white russians every 2 weeks   Drug use: No     Medication list has been reviewed and updated.  Current Meds  Medication Sig   amLODipine  (NORVASC ) 5 MG tablet Take 1 tablet (5 mg total) by mouth daily.   ascorbic acid (VITAMIN C) 250 MG tablet Take 250 mg by mouth daily.   aspirin 81 MG tablet Take 1 tablet by mouth daily.   atorvastatin  (LIPITOR) 10 MG tablet TAKE ONE TABLET BY MOUTH ONCE DAILY   Biotin 2500 MCG CAPS Take 5,000 mcg by mouth daily.   Calcium  Carb-Cholecalciferol (CALCIUM  + D3) 600-200 MG-UNIT TABS Take 1 tablet by mouth daily.   carvedilol (COREG) 6.25 MG tablet Take 6.25 mg by mouth 2 (two) times daily.   celecoxib  (CELEBREX ) 100 MG capsule Take 1 capsule (100 mg total) by mouth 2 (two) times daily.   Cinnamon 500 MG TABS Take 2 tablets by mouth daily.   Coenzyme Q10 (COQ10) 200 MG CAPS Take 1 capsule by mouth daily.   Cyanocobalamin (VITAMIN B-12 PO) Take 3,000 mcg by mouth daily.   Elderberry 500 MG CAPS Take by mouth.   escitalopram  (LEXAPRO ) 10 MG tablet TAKE ONE TABLET BY MOUTH ONCE DAILY   fluconazole  (DIFLUCAN ) 100 MG tablet Take 1 tablet (100 mg total) by mouth once for 1 dose.   fluticasone (FLONASE) 50 MCG/ACT nasal spray Place 2 sprays into both nostrils daily.   glucose blood test strip Use to test BS twice a day   Homeopathic Products (ARNICARE) GEL Apply topically daily.   JARDIANCE  10  MG TABS tablet Take 1 tablet (10 mg total) by mouth daily.   levocetirizine (XYZAL ) 5 MG tablet TAKE 1 TABLET BY MOUTH EVERY EVENING   lisinopril  (ZESTRIL ) 40 MG tablet Take 1 tablet (40 mg total) by mouth daily.   metFORMIN  (GLUCOPHAGE -XR) 500 MG 24 hr tablet Take 1 tablet (500 mg total) by mouth 2 (two) times daily.   Multiple Vitamin (MULTIVITAMIN) capsule Take 1 capsule by mouth daily.   nitrofurantoin , macrocrystal-monohydrate, (MACROBID ) 100 MG capsule Take 1 capsule (100 mg total) by mouth 2 (two) times daily.   nystatin  cream (MYCOSTATIN ) Apply topically 2 (two) times daily. For rash under breast and abdomen   Probiotic Product (PROBIOTIC PO) Take by mouth.   triamcinolone  ointment (KENALOG ) 0.1 % Apply a thin layer to affected external vulvar skin BID for 7-14 days,then taper to every other  day or stop based on response   zinc gluconate 50 MG tablet Take 50 mg by mouth daily.   [DISCONTINUED] omeprazole  (PRILOSEC) 20 MG capsule TAKE ONE CAPSULE BY MOUTH DAILY       12/15/2023    1:53 PM 12/03/2023   10:03 AM 08/01/2023    9:53 AM 03/31/2023    1:18 PM  GAD 7 : Generalized Anxiety Score  Nervous, Anxious, on Edge 0 1 0 0  Control/stop worrying 0 1 2 0  Worry too much - different things 0 1 1 1   Trouble relaxing 0 0 0   Restless 0 0 0 0  Easily annoyed or irritable 0 0 0 0  Afraid - awful might happen 0 0 0 0  Total GAD 7 Score 0 3 3   Anxiety Difficulty Not difficult at all Not difficult at all Somewhat difficult Not difficult at all       12/15/2023    1:53 PM 12/03/2023   10:03 AM 08/13/2023   11:02 AM  Depression screen PHQ 2/9  Decreased Interest 0 0 0  Down, Depressed, Hopeless 0 0 0  PHQ - 2 Score 0 0 0  Altered sleeping 0 0 0  Tired, decreased energy 0 0 1  Change in appetite 0 0 0  Feeling bad or failure about yourself  0 0 0  Trouble concentrating 0 0 0  Moving slowly or fidgety/restless 0 0 0  Suicidal thoughts 0 0 0  PHQ-9 Score 0 0 1  Difficult doing  work/chores Not difficult at all Not difficult at all Not difficult at all    BP Readings from Last 3 Encounters:  12/15/23 126/76  12/03/23 132/78  09/23/23 122/80    Physical Exam Vitals and nursing note reviewed.  Constitutional:      General: She is not in acute distress.    Appearance: Normal appearance. She is well-developed.  HENT:     Head: Normocephalic and atraumatic.  Cardiovascular:     Rate and Rhythm: Normal rate and regular rhythm.     Heart sounds: No murmur heard. Pulmonary:     Effort: Pulmonary effort is normal. No respiratory distress.     Breath sounds: No wheezing or rhonchi.  Abdominal:     General: There is no distension.     Palpations: Abdomen is soft.     Tenderness: There is abdominal tenderness. There is no guarding or rebound.  Musculoskeletal:     Right lower leg: No edema.     Left lower leg: No edema.  Skin:    General: Skin is warm and dry.     Findings: No rash.  Neurological:     Mental Status: She is alert and oriented to person, place, and time.  Psychiatric:        Mood and Affect: Mood normal.        Behavior: Behavior normal.     Wt Readings from Last 3 Encounters:  12/15/23 205 lb (93 kg)  12/03/23 206 lb (93.4 kg)  09/23/23 209 lb (94.8 kg)    BP 126/76   Pulse 93   Ht 5' 1 (1.549 m)   Wt 205 lb (93 kg)   SpO2 96%   BMI 38.73 kg/m   Assessment and Plan:  Problem List Items Addressed This Visit       Unprioritized   Dyspepsia - Primary   Currently taking omeprazole  and Pepto bismol with more reflux symptoms. Patient denies red flag symptoms - no  melena, weight loss, dysphagia. Recommend bid omeprazole  for one month with Gaviscon prn. Follow up if no improvement.       Relevant Medications   omeprazole  (PRILOSEC) 20 MG capsule   Other Visit Diagnoses       Urinary frequency       UA unremarkable will verify no infection with a culture   Relevant Orders   POCT urinalysis dipstick (Completed)   Urine  Culture     Subacute vaginitis       diflucan  100 mg x 1 dose   Relevant Medications   fluconazole  (DIFLUCAN ) 100 MG tablet       No follow-ups on file.    Leita HILARIO Adie, MD [facility] [place]

## 2023-12-15 NOTE — Patient Instructions (Signed)
 Increase the Omeprazole  to twice a day.  Take Gaviscon 1-2 times per AS NEEDED for heartburn.

## 2023-12-15 NOTE — Assessment & Plan Note (Addendum)
 Currently taking omeprazole  and Pepto bismol with more reflux symptoms. Patient denies red flag symptoms - no melena, weight loss, dysphagia. Recommend bid omeprazole  for one month with Gaviscon prn. Follow up if no improvement.

## 2023-12-18 LAB — URINE CULTURE

## 2023-12-19 ENCOUNTER — Ambulatory Visit: Payer: Self-pay | Admitting: Internal Medicine

## 2023-12-19 DIAGNOSIS — N3000 Acute cystitis without hematuria: Secondary | ICD-10-CM

## 2023-12-19 MED ORDER — NITROFURANTOIN MONOHYD MACRO 100 MG PO CAPS: 100.0000 mg | ORAL_CAPSULE | Freq: Two times a day (BID) | ORAL | 0 refills | Status: AC

## 2024-01-02 ENCOUNTER — Other Ambulatory Visit: Payer: Self-pay | Admitting: Internal Medicine

## 2024-01-02 DIAGNOSIS — F3341 Major depressive disorder, recurrent, in partial remission: Secondary | ICD-10-CM

## 2024-01-05 NOTE — Telephone Encounter (Signed)
 Requested Prescriptions  Pending Prescriptions Disp Refills   escitalopram  (LEXAPRO ) 10 MG tablet [Pharmacy Med Name: ESCITALOPRAM  OXALATE 10 MG TAB] 90 tablet 0    Sig: TAKE 1 TABLET BY MOUTH ONCE DAILY     Psychiatry:  Antidepressants - SSRI Passed - 01/05/2024 11:38 AM      Passed - Completed PHQ-2 or PHQ-9 in the last 360 days      Passed - Valid encounter within last 6 months    Recent Outpatient Visits           3 weeks ago Dyspepsia   [place] [facility] at [facility], Leita DEL, MD   1 month ago DM type 2, controlled, with complication [facility])   [place] [facility] at [facility], Leita DEL, MD   3 months ago Vaginal itching   [place] [facility] at [facility], Selinda PARAS, MD   5 months ago Annual physical exam   [facility] at [facility], Leita DEL, MD   9 months ago Essential (primary) hypertension   [facility] at [facility], Leita DEL, MD

## 2024-01-13 DIAGNOSIS — R0609 Other forms of dyspnea: Secondary | ICD-10-CM | POA: Diagnosis not present

## 2024-01-13 DIAGNOSIS — R6889 Other general symptoms and signs: Secondary | ICD-10-CM | POA: Diagnosis not present

## 2024-01-13 DIAGNOSIS — I350 Nonrheumatic aortic (valve) stenosis: Secondary | ICD-10-CM | POA: Diagnosis not present

## 2024-01-13 DIAGNOSIS — R0789 Other chest pain: Secondary | ICD-10-CM | POA: Diagnosis not present

## 2024-01-22 DIAGNOSIS — I342 Nonrheumatic mitral (valve) stenosis: Secondary | ICD-10-CM | POA: Diagnosis not present

## 2024-01-22 DIAGNOSIS — I340 Nonrheumatic mitral (valve) insufficiency: Secondary | ICD-10-CM | POA: Diagnosis not present

## 2024-01-22 DIAGNOSIS — I350 Nonrheumatic aortic (valve) stenosis: Secondary | ICD-10-CM | POA: Diagnosis not present

## 2024-01-22 DIAGNOSIS — R0609 Other forms of dyspnea: Secondary | ICD-10-CM | POA: Diagnosis not present

## 2024-02-20 ENCOUNTER — Other Ambulatory Visit: Payer: Self-pay | Admitting: Internal Medicine

## 2024-02-23 NOTE — Telephone Encounter (Signed)
 Requested Prescriptions  Pending Prescriptions Disp Refills   levocetirizine (XYZAL ) 5 MG tablet [Pharmacy Med Name: LEVOCETIRIZINE DIHYDROCHLORIDE  5 MG] 90 tablet 0    Sig: TAKE 1 TABLET BY MOUTH EVERY EVENING     Ear, Nose, and Throat:  Antihistamines - levocetirizine dihydrochloride  Passed - 02/23/2024 11:46 AM      Passed - Cr in normal range and within 360 days    Creatinine, Ser  Date Value Ref Range Status  08/01/2023 0.92 0.57 - 1.00 mg/dL Final         Passed - eGFR is 10 or above and within 360 days    GFR calc Af Amer  Date Value Ref Range Status  11/16/2019 72 >59 mL/min/1.73 Final    Comment:    **[facility] currently reports eGFR in compliance with the current**   recommendations of the [employer]. [facility] will   update reporting as new guidelines are published from the NKF-ASN   Task force.    GFR calc non Af Amer  Date Value Ref Range Status  11/16/2019 62 >59 mL/min/1.73 Final   eGFR  Date Value Ref Range Status  08/01/2023 63 >59 mL/min/1.73 Final         Passed - Valid encounter within last 12 months    Recent Outpatient Visits           2 months ago Dyspepsia   [place] [facility] at [facility], Leita DEL, MD   2 months ago DM type 2, controlled, with complication [facility])   [place] [facility] at [facility], Leita DEL, MD   5 months ago Vaginal itching   [place] [facility] at [facility], Selinda PARAS, MD   6 months ago Annual physical exam   [facility] at [facility], Leita DEL, MD   10 months ago Essential (primary) hypertension   [facility] at [facility], Leita DEL, MD

## 2024-02-25 ENCOUNTER — Telehealth: Payer: Self-pay

## 2024-02-25 NOTE — Telephone Encounter (Signed)
Please schedule an appt.  KP

## 2024-02-25 NOTE — Telephone Encounter (Signed)
 Copied from CRM #[id number]. Topic: Clinical - Prescription Issue >> Feb 25, 2024 11:00 AM Selinda RAMAN wrote: Reason for CRM: The patient called in stating even though she increased her omeprazole  (PRILOSEC) 20 MG capsule per her providers orders she states it has still not helped her with the burning and diarrhea issues she has had. She is wondering if something else more effective can be called in. She uses  [facility] [place], [place] - [address]  Phone: [phone] Fax: [phone]  Please assist patient further. She also states please call her if she has to be seen first as she has an appointment for transfer of care next month already scheduled.

## 2024-02-25 NOTE — Telephone Encounter (Signed)
 Please schedule visit

## 2024-02-26 ENCOUNTER — Ambulatory Visit: Admitting: Family Medicine

## 2024-02-26 ENCOUNTER — Encounter: Payer: Self-pay | Admitting: Family Medicine

## 2024-02-26 VITALS — BP 128/78 | HR 72 | Ht 61.0 in | Wt 203.0 lb

## 2024-02-26 DIAGNOSIS — E785 Hyperlipidemia, unspecified: Secondary | ICD-10-CM | POA: Diagnosis not present

## 2024-02-26 DIAGNOSIS — E1169 Type 2 diabetes mellitus with other specified complication: Secondary | ICD-10-CM | POA: Diagnosis not present

## 2024-02-26 DIAGNOSIS — Z7984 Long term (current) use of oral hypoglycemic drugs: Secondary | ICD-10-CM | POA: Diagnosis not present

## 2024-02-26 DIAGNOSIS — R1013 Epigastric pain: Secondary | ICD-10-CM

## 2024-02-26 MED ORDER — PANTOPRAZOLE SODIUM 40 MG PO TBEC: 40.0000 mg | DELAYED_RELEASE_TABLET | Freq: Every day | ORAL | 3 refills | Status: AC

## 2024-02-26 MED ORDER — SUCRALFATE 1 GM/10ML PO SUSP: 1.0000 g | Freq: Three times a day (TID) | ORAL | 0 refills | Status: AC

## 2024-02-26 NOTE — Progress Notes (Signed)
 "  Established Patient Office Visit  Patient ID: Kimberly Walls, female    DOB: Jan 10, 1941  Age: 84 y.o. MRN: [id number] PCP: Taylormarie Register K, MD  Chief Complaint  Patient presents with   Gastroesophageal Reflux    Has a lot of burping, gas an diarrhea, burning sensation after eating     Subjective:     HPI  Discussed the use of AI scribe software for clinical note transcription with the patient, who gave verbal consent to proceed.  History of Present Illness Kimberly Walls is an 84 year old female who presents with diarrhea and heartburn.  She has been experiencing diarrhea for approximately six weeks, with episodes occurring two to three times a day. The diarrhea is watery and not accompanied by blood. The onset of diarrhea often coincides with emotional stress.  Heartburn and a burning sensation in the stomach have been present for about six weeks. The burning is exacerbated by certain foods, such as Healthy Choice dinners, and is described as painful. She has been taking omeprazole  20 mg twice daily, mostly on an empty stomach, since the end of October, but it has not been effective. She also uses Pepcid, but finds it unhelpful.  She denies smoking and drinks alcohol only occasionally. She has a history of prediabetes and is currently taking Jardiance . Her blood sugar levels are typically between 117 and 130 mg/dL, which she monitors before breakfast.  She recalls undergoing a diagnostic test involving drinking a contrast solution and imaging, but details are vague and it was performed a long time ago.      Review of Systems  All other systems reviewed and are negative.     Objective:     BP 128/78   Pulse 72   Ht 5' 1 (1.549 m)   Wt 203 lb (92.1 kg)   SpO2 93%   BMI 38.36 kg/m     Physical Exam Vitals and nursing note reviewed.  Constitutional:      Appearance: Normal appearance.  HENT:     Head: Normocephalic.     Right Ear: External ear normal.      Left Ear: External ear normal.  Eyes:     Conjunctiva/sclera: Conjunctivae normal.  Cardiovascular:     Rate and Rhythm: Normal rate.  Pulmonary:     Effort: Pulmonary effort is normal. No respiratory distress.  Abdominal:     Palpations: Abdomen is soft.  Musculoskeletal:        General: Normal range of motion.  Skin:    General: Skin is warm.  Neurological:     Mental Status: She is alert and oriented to person, place, and time.  Psychiatric:        Mood and Affect: Mood normal.     Physical Exam     No results found for any visits on 02/26/24.     The ASCVD Risk score (Arnett DK, et al., 2019) failed to calculate for the following reasons:   The 2019 ASCVD risk score is only valid for ages 66 to 54   * - Cholesterol units were assumed    Assessment & Plan:   Problem List Items Addressed This Visit       Other   Dyspepsia - Primary   Relevant Medications   pantoprazole  (PROTONIX ) 40 MG tablet   sucralfate  (CARAFATE ) 1 GM/10ML suspension    Assessment and Plan Assessment & Plan Dyspepsia Chronic dyspepsia unresponsive to omeprazole , exacerbated by certain foods. - Prescribed sucralfate   syrup four times daily. - Instructed to discontinue omeprazole . - Advised to take Protonix  on an empty stomach in the morning. - Scheduled follow-up in one month to reassess.  Type 2 diabetes mellitus Prediabetes managed with metformin  and Jardiance , with generally well-controlled glucose levels. - Instructed to stop metformin  temporarily due to diarrhea. - Continue Jardiance  as prescribed. - Advised regular blood glucose monitoring, preferably before meals. - Provided dietary guidance to avoid large meals and fatty foods.    No follow-ups on file.    Natividad Halls K Quantasia Stegner, MD [facility] at [facility]   "

## 2024-03-15 ENCOUNTER — Telehealth: Payer: Self-pay | Admitting: *Deleted

## 2024-03-15 NOTE — Transitions of Care (Post Inpatient/ED Visit) (Signed)
 "  03/15/2024  Name: RAELEE Walls MRN: [id number] DOB: 1941/02/10  Today's TOC FU Call Status: Today's TOC FU Call Status:: Successful TOC FU Call Completed TOC FU Call Complete Date: 03/15/24  Patient's Name and Date of Birth confirmed. Name, DOB  Transition Care Management Follow-up Telephone Call Date of Discharge: 03/12/24 Discharge Facility: Other [facility]) Name of Other ([facility]) Discharge Facility: [facility] Type of Discharge: Inpatient Admission Primary Inpatient Discharge Diagnosis:: Acute Colitis; (L) lower quandrant abdominal pain; colonoscopy How have you been since you were released from the hospital?: Better (I am doing fine now; eating good and going to the bathroom normally, no pain.  I don't need anyone calling me regularly- I am fine now that the stomach bug is gone) Any questions or concerns?: No  Items Reviewed: Did you receive and understand the discharge instructions provided?: Yes (briefly reviewed with patient who verbalizes good understanding of same - outside hospital AVS) Medications obtained,verified, and reconciled?: Yes (Medications Reviewed) (Full medication reconciliation/ review completed; no concerns or discrepancies identified; confirmed patient obtained/ is taking all newly Rx'd medications as instructed; self-manages medications and denies questions/ concerns around medications today) Any new allergies since your discharge?: No Dietary orders reviewed?: Yes Type of Diet Ordered:: Trying to eat on a regular schedule; regular food Do you have support at home?: Yes People in Home [RPT]: alone Name of Support/Comfort Primary Source: Reports independent in self-care activities; resides alone; local supportive brother lives right up the street from me;- assists as/ if needed/ indicated  Medications Reviewed Today: Medications Reviewed Today     Reviewed by Maliaka Brasington M, RN (Registered Nurse) on 03/15/24 at 1115  Med List Status:  <None>   Medication Order Taking? Sig Documenting Provider Last Dose Status Informant  amLODipine  (NORVASC ) 5 MG tablet [id number] Yes Take 1 tablet (5 mg total) by mouth daily.  Patient taking differently: Take 5 mg by mouth daily. 03/15/24: Reports during TOC call was increased from 5 mg to 10 mg after recent hospital visit   Justus Leita DEL, MD  Active   ascorbic acid (VITAMIN C) 250 MG tablet [id number] Yes Take 250 mg by mouth daily. [provider]  Active   aspirin 81 MG tablet [id number] Yes Take 1 tablet by mouth daily.  Patient taking differently: Take 1 tablet by mouth daily. 03/15/24: Reports during TOC call- this is currently on hold after recent hospital discharge from [facility] 03/12/24   [provider]  Active            Med Note VASHTI MARGARIE JONETTA Pablo Jul 24, 2020  2:42 PM)    atorvastatin  (LIPITOR) 10 MG tablet [id number] Yes TAKE ONE TABLET BY MOUTH ONCE DAILY Justus Leita DEL, MD  Active   Biotin 2500 MCG CAPS [id number] Yes Take 5,000 mcg by mouth daily. [provider]  Active   Calcium  Carb-Cholecalciferol (CALCIUM  + D3) 600-200 MG-UNIT TABS [id number] Yes Take 1 tablet by mouth daily. [provider]  Active            Med Note VASHTI MARGARIE JONETTA Pablo Jul 24, 2020  2:45 PM)    carvedilol (COREG) 6.25 MG tablet [id number] Yes Take 6.25 mg by mouth 2 (two) times daily.  Patient taking differently: Take 6.25 mg by mouth 2 (two) times daily. 03/15/24: Reports during TOC call dose was increased to 12.5 mg po BID: at time of hospital discharge from [facility] on 03/15/24   [provider]  Active   celecoxib  (CELEBREX ) 100 MG capsule [id number]  Take 1 capsule (100 mg total) by mouth 2 (two) times daily.  Patient not taking: Reported on 03/15/2024   Justus Leita DEL, MD  Active   Cinnamon 500 MG TABS [id number] Yes Take 2 tablets by mouth daily.  Patient taking differently: Take 2 tablets by mouth daily. 03/15/24: Reports during TOC call has not been  taking regularly- takes occasionally   [provider]  Active   Coenzyme Q10 (COQ10) 200 MG CAPS [id number] Yes Take 1 capsule by mouth daily. [provider]  Active            Med Note VASHTI MARGARIE JONETTA Pablo Jul 24, 2020  2:45 PM)    Cyanocobalamin (VITAMIN B-12 PO) [id number] Yes Take 3,000 mcg by mouth daily. [provider]  Active   Elderberry 500 MG CAPS [id number]  Take by mouth.  Patient not taking: Reported on 03/15/2024   [provider]  Active   escitalopram  (LEXAPRO ) 10 MG tablet [id number] Yes TAKE 1 TABLET BY MOUTH ONCE DAILY Berglund, Laura H, MD  Active   fluticasone [facility]) 50 MCG/ACT nasal spray [id number]  Place 2 sprays into both nostrils daily.  Patient not taking: Reported on 03/15/2024   [provider]  Active   glucose blood test strip [id number] Yes Use to test BS twice a day Justus Leita DEL, MD  Active   Homeopathic Products (ARNICARE) GEL [id number] Yes Apply topically daily. [provider]  Active   JARDIANCE  10 MG TABS tablet [id number] Yes Take 1 tablet (10 mg total) by mouth daily. Justus Leita DEL, MD  Active   Lactobacillus-Inulin (CULTURELLE DIGESTIVE DAILY) CAPS [id number] Yes Take by mouth. [provider]  Active   levocetirizine (XYZAL ) 5 MG tablet [id number] Yes TAKE 1 TABLET BY MOUTH EVERY EVENING Kotturi, Vinay K, MD  Active   lisinopril  (ZESTRIL ) 40 MG tablet [id number] Yes Take 1 tablet (40 mg total) by mouth daily. Justus Leita DEL, MD  Active   metFORMIN  (GLUCOPHAGE -XR) 500 MG 24 hr tablet [id number] Yes Take 1 tablet (500 mg total) by mouth 2 (two) times daily.  Patient taking differently: Take 500 mg by mouth 2 (two) times daily. 03/15/24:  Reports during TOC call this was stopped at time of hospital discharge from [facility] on 03/12/24   Justus Leita DEL, MD  Active   Multiple Vitamin (MULTIVITAMIN) capsule [id number] Yes Take 1 capsule by mouth daily. [provider]  Active   nystatin   cream (MYCOSTATIN ) [id number] Yes Apply topically 2 (two) times daily. For rash under breast and abdomen  Patient taking differently: Apply topically 2 (two) times daily. 03/15/24: Reports during TOC call has not needed recently  For rash under breast and abdomen   Justus Leita DEL, MD  Active   pantoprazole  (PROTONIX ) 40 MG tablet [id number] Yes Take 1 tablet (40 mg total) by mouth daily. Kotturi, Vinay K, MD  Active   Probiotic Product (PROBIOTIC PO) [id number] Yes Take by mouth. [provider]  Active   sucralfate  (CARAFATE ) 1 GM/10ML suspension [id number] Yes Take 10 mLs (1 g total) by mouth 4 (four) times daily -  with meals and at bedtime. Kotturi, Vinay K, MD  Active   triamcinolone  ointment (KENALOG ) 0.1 % [id number] Yes Apply a thin layer to affected external vulvar skin BID for 7-14 days,then taper to every other day or stop based on response  Patient taking differently: 03/15/24: Reports during TOC call has  not needed recently   Apply a thin layer to affected external vulvar skin BID for 7-14 days,then taper to every other day or stop based on response   Alvia Selinda PARAS, MD  Active   zinc gluconate 50 MG tablet [id number] Yes Take 50 mg by mouth daily. [provider]  Active            Home Care and Equipment/Supplies: Were Home Health Services Ordered?: No Any new equipment or medical supplies ordered?: No  Functional Questionnaire: Do you need assistance with bathing/showering or dressing?: No Do you need assistance with meal preparation?: No Do you need assistance with eating?: No Do you have difficulty maintaining continence: No Do you need assistance with getting out of bed/getting out of a chair/moving?: No Do you have difficulty managing or taking your medications?: No  Follow up appointments reviewed: PCP Follow-up appointment confirmed?: No (Patient declined scheduling Hospital follow up office visit with PCP by [facility] RN CM in real-time: I will be  going to see the doctor as scheduled 03/30/24, do not want sooner appointment) MD Provider Line Number:[phone] Given: No (verified well-established with current PCP) Specialist Hospital Follow-up appointment confirmed?: NA (verified not indicated per hospital discharging provider discharge notes) Do you need transportation to your follow-up appointment?: No Do you understand care options if your condition(s) worsen?: Yes-patient verbalized understanding  SDOH Interventions Today    Flowsheet Row Most Recent Value  SDOH Interventions   Food Insecurity Interventions Intervention Not Indicated  Housing Interventions Intervention Not Indicated  Transportation Interventions Intervention Not Indicated  Utilities Interventions Intervention Not Indicated   See TOC assessment tabs for additional assessment/ TOC intervention information  Patient declines need for ongoing/ further care management/ coordination outreach; declines enrollment in 30-day TOC program- declines taking my direct phone number should needs/ concerns arise post-TOC call   Pls call/ message for questions,  Chanae Gemma Mckinney Virgil Lightner, RN, BSN, CCRN Alumnus RN Care Manager  Transitions of Care  [facility] Population [facility]  [place] [phone]: direct office  "

## 2024-03-30 ENCOUNTER — Encounter: Admitting: Family Medicine

## 2024-04-08 ENCOUNTER — Encounter: Admitting: Family Medicine

## 2024-08-18 ENCOUNTER — Ambulatory Visit
